# Patient Record
Sex: Male | Born: 1940 | Race: White | Hispanic: No | Marital: Single | Smoking: Never smoker
Health system: Southern US, Community
[De-identification: ages and names within clinical notes are randomized; demographics above are authoritative.]

## PROBLEM LIST (undated history)

## (undated) DIAGNOSIS — I4891 Unspecified atrial fibrillation: Secondary | ICD-10-CM

## (undated) HISTORY — PX: HERNIA REPAIR: SHX51

---

## 1999-10-12 ENCOUNTER — Inpatient Hospital Stay (HOSPITAL_COMMUNITY): Admission: EM | Admit: 1999-10-12 | Discharge: 1999-10-14 | Payer: Self-pay | Admitting: Emergency Medicine

## 1999-10-12 ENCOUNTER — Encounter: Payer: Self-pay | Admitting: Emergency Medicine

## 2004-05-03 ENCOUNTER — Ambulatory Visit: Payer: Self-pay | Admitting: Internal Medicine

## 2004-06-23 ENCOUNTER — Ambulatory Visit: Payer: Self-pay | Admitting: *Deleted

## 2004-07-19 ENCOUNTER — Ambulatory Visit: Payer: Self-pay

## 2004-08-16 ENCOUNTER — Ambulatory Visit: Payer: Self-pay | Admitting: Cardiology

## 2004-09-06 ENCOUNTER — Ambulatory Visit: Payer: Self-pay | Admitting: Cardiology

## 2004-10-27 ENCOUNTER — Ambulatory Visit: Payer: Self-pay | Admitting: Internal Medicine

## 2004-11-15 ENCOUNTER — Ambulatory Visit: Payer: Self-pay | Admitting: Internal Medicine

## 2004-12-10 ENCOUNTER — Ambulatory Visit: Payer: Self-pay | Admitting: Cardiology

## 2004-12-31 ENCOUNTER — Ambulatory Visit: Payer: Self-pay | Admitting: Cardiology

## 2005-01-28 ENCOUNTER — Ambulatory Visit: Payer: Self-pay | Admitting: Cardiology

## 2005-02-08 ENCOUNTER — Ambulatory Visit: Payer: Self-pay | Admitting: Cardiology

## 2005-02-28 ENCOUNTER — Ambulatory Visit: Payer: Self-pay | Admitting: Cardiology

## 2005-03-21 ENCOUNTER — Ambulatory Visit: Payer: Self-pay | Admitting: Cardiology

## 2005-04-18 ENCOUNTER — Ambulatory Visit: Payer: Self-pay | Admitting: Cardiology

## 2005-05-09 ENCOUNTER — Ambulatory Visit: Payer: Self-pay | Admitting: Cardiology

## 2005-06-03 ENCOUNTER — Ambulatory Visit: Payer: Self-pay | Admitting: Cardiology

## 2014-01-31 DIAGNOSIS — G4733 Obstructive sleep apnea (adult) (pediatric): Secondary | ICD-10-CM | POA: Insufficient documentation

## 2015-12-22 DIAGNOSIS — F339 Major depressive disorder, recurrent, unspecified: Secondary | ICD-10-CM | POA: Insufficient documentation

## 2018-01-15 ENCOUNTER — Inpatient Hospital Stay (HOSPITAL_COMMUNITY)
Admission: EM | Admit: 2018-01-15 | Discharge: 2018-01-19 | DRG: 028 | Disposition: A | Discharge status: Rehabilitation Facility | Payer: Medicare Other | Attending: Neurosurgery | Admitting: Neurosurgery

## 2018-01-15 ENCOUNTER — Emergency Department (HOSPITAL_COMMUNITY): Payer: Medicare Other

## 2018-01-15 ENCOUNTER — Other Ambulatory Visit: Payer: Self-pay

## 2018-01-15 ENCOUNTER — Encounter (HOSPITAL_COMMUNITY): Payer: Self-pay | Admitting: *Deleted

## 2018-01-15 ENCOUNTER — Inpatient Hospital Stay (HOSPITAL_COMMUNITY): Payer: Medicare Other

## 2018-01-15 DIAGNOSIS — R5383 Other fatigue: Secondary | ICD-10-CM | POA: Diagnosis present

## 2018-01-15 DIAGNOSIS — K592 Neurogenic bowel, not elsewhere classified: Secondary | ICD-10-CM | POA: Diagnosis not present

## 2018-01-15 DIAGNOSIS — R443 Hallucinations, unspecified: Secondary | ICD-10-CM | POA: Diagnosis not present

## 2018-01-15 DIAGNOSIS — I10 Essential (primary) hypertension: Secondary | ICD-10-CM | POA: Diagnosis present

## 2018-01-15 DIAGNOSIS — Z419 Encounter for procedure for purposes other than remedying health state, unspecified: Secondary | ICD-10-CM

## 2018-01-15 DIAGNOSIS — G8250 Quadriplegia, unspecified: Secondary | ICD-10-CM | POA: Diagnosis present

## 2018-01-15 DIAGNOSIS — S069X3S Unspecified intracranial injury with loss of consciousness of 1 hour to 5 hours 59 minutes, sequela: Secondary | ICD-10-CM | POA: Diagnosis not present

## 2018-01-15 DIAGNOSIS — N319 Neuromuscular dysfunction of bladder, unspecified: Secondary | ICD-10-CM | POA: Diagnosis not present

## 2018-01-15 DIAGNOSIS — S12500A Unspecified displaced fracture of sixth cervical vertebra, initial encounter for closed fracture: Secondary | ICD-10-CM | POA: Diagnosis present

## 2018-01-15 DIAGNOSIS — S129XXS Fracture of neck, unspecified, sequela: Secondary | ICD-10-CM | POA: Diagnosis not present

## 2018-01-15 DIAGNOSIS — Y92410 Unspecified street and highway as the place of occurrence of the external cause: Secondary | ICD-10-CM

## 2018-01-15 DIAGNOSIS — M4802 Spinal stenosis, cervical region: Secondary | ICD-10-CM | POA: Diagnosis present

## 2018-01-15 DIAGNOSIS — I7774 Dissection of vertebral artery: Secondary | ICD-10-CM | POA: Diagnosis present

## 2018-01-15 DIAGNOSIS — Z23 Encounter for immunization: Secondary | ICD-10-CM | POA: Diagnosis present

## 2018-01-15 DIAGNOSIS — D62 Acute posthemorrhagic anemia: Secondary | ICD-10-CM | POA: Diagnosis not present

## 2018-01-15 DIAGNOSIS — S14125A Central cord syndrome at C5 level of cervical spinal cord, initial encounter: Principal | ICD-10-CM | POA: Diagnosis present

## 2018-01-15 DIAGNOSIS — Z981 Arthrodesis status: Secondary | ICD-10-CM | POA: Diagnosis not present

## 2018-01-15 DIAGNOSIS — G4710 Hypersomnia, unspecified: Secondary | ICD-10-CM | POA: Diagnosis not present

## 2018-01-15 DIAGNOSIS — S129XXA Fracture of neck, unspecified, initial encounter: Secondary | ICD-10-CM | POA: Diagnosis present

## 2018-01-15 DIAGNOSIS — R4781 Slurred speech: Secondary | ICD-10-CM | POA: Diagnosis present

## 2018-01-15 DIAGNOSIS — R0989 Other specified symptoms and signs involving the circulatory and respiratory systems: Secondary | ICD-10-CM | POA: Diagnosis not present

## 2018-01-15 DIAGNOSIS — S12400A Unspecified displaced fracture of fifth cervical vertebra, initial encounter for closed fracture: Secondary | ICD-10-CM | POA: Diagnosis present

## 2018-01-15 DIAGNOSIS — G8918 Other acute postprocedural pain: Secondary | ICD-10-CM | POA: Diagnosis not present

## 2018-01-15 DIAGNOSIS — I4891 Unspecified atrial fibrillation: Secondary | ICD-10-CM | POA: Diagnosis present

## 2018-01-15 DIAGNOSIS — Y901 Blood alcohol level of 20-39 mg/100 ml: Secondary | ICD-10-CM | POA: Diagnosis present

## 2018-01-15 DIAGNOSIS — Z888 Allergy status to other drugs, medicaments and biological substances status: Secondary | ICD-10-CM | POA: Diagnosis not present

## 2018-01-15 DIAGNOSIS — R400 Somnolence: Secondary | ICD-10-CM | POA: Diagnosis present

## 2018-01-15 DIAGNOSIS — F1010 Alcohol abuse, uncomplicated: Secondary | ICD-10-CM | POA: Diagnosis not present

## 2018-01-15 DIAGNOSIS — S14109A Unspecified injury at unspecified level of cervical spinal cord, initial encounter: Secondary | ICD-10-CM | POA: Diagnosis present

## 2018-01-15 DIAGNOSIS — G4733 Obstructive sleep apnea (adult) (pediatric): Secondary | ICD-10-CM | POA: Diagnosis present

## 2018-01-15 DIAGNOSIS — A499 Bacterial infection, unspecified: Secondary | ICD-10-CM | POA: Diagnosis not present

## 2018-01-15 DIAGNOSIS — S14106D Unspecified injury at C6 level of cervical spinal cord, subsequent encounter: Secondary | ICD-10-CM | POA: Diagnosis present

## 2018-01-15 DIAGNOSIS — Z7901 Long term (current) use of anticoagulants: Secondary | ICD-10-CM | POA: Diagnosis not present

## 2018-01-15 DIAGNOSIS — N400 Enlarged prostate without lower urinary tract symptoms: Secondary | ICD-10-CM | POA: Diagnosis present

## 2018-01-15 DIAGNOSIS — S14129A Central cord syndrome at unspecified level of cervical spinal cord, initial encounter: Secondary | ICD-10-CM

## 2018-01-15 DIAGNOSIS — Z79899 Other long term (current) drug therapy: Secondary | ICD-10-CM | POA: Diagnosis not present

## 2018-01-15 DIAGNOSIS — G9519 Other vascular myelopathies: Secondary | ICD-10-CM | POA: Diagnosis present

## 2018-01-15 DIAGNOSIS — N390 Urinary tract infection, site not specified: Secondary | ICD-10-CM | POA: Diagnosis not present

## 2018-01-15 DIAGNOSIS — Z882 Allergy status to sulfonamides status: Secondary | ICD-10-CM | POA: Diagnosis not present

## 2018-01-15 DIAGNOSIS — R4182 Altered mental status, unspecified: Secondary | ICD-10-CM | POA: Diagnosis not present

## 2018-01-15 DIAGNOSIS — E876 Hypokalemia: Secondary | ICD-10-CM | POA: Diagnosis not present

## 2018-01-15 DIAGNOSIS — R1312 Dysphagia, oropharyngeal phase: Secondary | ICD-10-CM | POA: Diagnosis not present

## 2018-01-15 DIAGNOSIS — S12400D Unspecified displaced fracture of fifth cervical vertebra, subsequent encounter for fracture with routine healing: Secondary | ICD-10-CM | POA: Diagnosis not present

## 2018-01-15 DIAGNOSIS — G4730 Sleep apnea, unspecified: Secondary | ICD-10-CM | POA: Diagnosis not present

## 2018-01-15 DIAGNOSIS — S14109S Unspecified injury at unspecified level of cervical spinal cord, sequela: Secondary | ICD-10-CM | POA: Diagnosis not present

## 2018-01-15 DIAGNOSIS — I480 Paroxysmal atrial fibrillation: Secondary | ICD-10-CM | POA: Diagnosis not present

## 2018-01-15 DIAGNOSIS — I951 Orthostatic hypotension: Secondary | ICD-10-CM | POA: Diagnosis not present

## 2018-01-15 DIAGNOSIS — I482 Chronic atrial fibrillation: Secondary | ICD-10-CM | POA: Diagnosis not present

## 2018-01-15 DIAGNOSIS — S069X0D Unspecified intracranial injury without loss of consciousness, subsequent encounter: Secondary | ICD-10-CM | POA: Diagnosis not present

## 2018-01-15 DIAGNOSIS — S12500D Unspecified displaced fracture of sixth cervical vertebra, subsequent encounter for fracture with routine healing: Secondary | ICD-10-CM | POA: Diagnosis not present

## 2018-01-15 DIAGNOSIS — S14106S Unspecified injury at C6 level of cervical spinal cord, sequela: Secondary | ICD-10-CM | POA: Diagnosis not present

## 2018-01-15 DIAGNOSIS — R442 Other hallucinations: Secondary | ICD-10-CM | POA: Diagnosis not present

## 2018-01-15 DIAGNOSIS — R1310 Dysphagia, unspecified: Secondary | ICD-10-CM | POA: Diagnosis not present

## 2018-01-15 HISTORY — DX: Unspecified atrial fibrillation: I48.91

## 2018-01-15 LAB — CBC
HEMATOCRIT: 41 % (ref 39.0–52.0)
HEMOGLOBIN: 13.4 g/dL (ref 13.0–17.0)
MCH: 32.2 pg (ref 26.0–34.0)
MCHC: 32.7 g/dL (ref 30.0–36.0)
MCV: 98.6 fL (ref 78.0–100.0)
Platelets: 183 10*3/uL (ref 150–400)
RBC: 4.16 MIL/uL — AB (ref 4.22–5.81)
RDW: 13.3 % (ref 11.5–15.5)
WBC: 5 10*3/uL (ref 4.0–10.5)

## 2018-01-15 LAB — BPAM FFP
BLOOD PRODUCT EXPIRATION DATE: 201908202359
BLOOD PRODUCT EXPIRATION DATE: 201908312359
ISSUE DATE / TIME: 201908121416
ISSUE DATE / TIME: 201908121416
UNIT TYPE AND RH: 6200
Unit Type and Rh: 6200

## 2018-01-15 LAB — URINALYSIS, ROUTINE W REFLEX MICROSCOPIC
BILIRUBIN URINE: NEGATIVE
GLUCOSE, UA: NEGATIVE mg/dL
Hgb urine dipstick: NEGATIVE
Ketones, ur: 5 mg/dL — AB
Nitrite: NEGATIVE
Protein, ur: NEGATIVE mg/dL
Specific Gravity, Urine: 1.024 (ref 1.005–1.030)
pH: 6 (ref 5.0–8.0)

## 2018-01-15 LAB — ETHANOL: Alcohol, Ethyl (B): 34 mg/dL — ABNORMAL HIGH (ref <10)

## 2018-01-15 LAB — I-STAT CHEM 8, ED
BUN: 15 mg/dL (ref 8–23)
CREATININE: 0.7 mg/dL (ref 0.61–1.24)
Calcium, Ion: 1 mmol/L — ABNORMAL LOW (ref 1.15–1.40)
Chloride: 104 mmol/L (ref 98–111)
Glucose, Bld: 94 mg/dL (ref 70–99)
HEMATOCRIT: 39 % (ref 39.0–52.0)
HEMOGLOBIN: 13.3 g/dL (ref 13.0–17.0)
Potassium: 4.1 mmol/L (ref 3.5–5.1)
Sodium: 138 mmol/L (ref 135–145)
TCO2: 20 mmol/L — AB (ref 22–32)

## 2018-01-15 LAB — PREPARE FRESH FROZEN PLASMA
UNIT DIVISION: 0
Unit division: 0

## 2018-01-15 LAB — COMPREHENSIVE METABOLIC PANEL
ALT: 16 U/L (ref 0–44)
AST: 26 U/L (ref 15–41)
Albumin: 3.1 g/dL — ABNORMAL LOW (ref 3.5–5.0)
Alkaline Phosphatase: 43 U/L (ref 38–126)
Anion gap: 8 (ref 5–15)
BUN: 14 mg/dL (ref 8–23)
CO2: 23 mmol/L (ref 22–32)
Calcium: 7.9 mg/dL — ABNORMAL LOW (ref 8.9–10.3)
Chloride: 107 mmol/L (ref 98–111)
Creatinine, Ser: 0.81 mg/dL (ref 0.61–1.24)
GFR calc non Af Amer: >60 mL/min (ref >60)
Glucose, Bld: 100 mg/dL — ABNORMAL HIGH (ref 70–99)
POTASSIUM: 4.2 mmol/L (ref 3.5–5.1)
Sodium: 138 mmol/L (ref 135–145)
Total Bilirubin: 0.9 mg/dL (ref 0.3–1.2)
Total Protein: 5.2 g/dL — ABNORMAL LOW (ref 6.5–8.1)

## 2018-01-15 LAB — PROTIME-INR
INR: 2.66
INR: 1.38
Prothrombin Time: 28.1 seconds — ABNORMAL HIGH (ref 11.4–15.2)
Prothrombin Time: 16.8 seconds — ABNORMAL HIGH (ref 11.4–15.2)

## 2018-01-15 LAB — ABO/RH: ABO/RH(D): A POS

## 2018-01-15 LAB — CDS SEROLOGY

## 2018-01-15 LAB — I-STAT CG4 LACTIC ACID, ED: Lactic Acid, Venous: 1.99 mmol/L — ABNORMAL HIGH (ref 0.5–1.9)

## 2018-01-15 LAB — APTT: aPTT: 32 seconds (ref 24–36)

## 2018-01-15 MED ORDER — ONDANSETRON HCL 4 MG/2ML IJ SOLN
4.0000 mg | Freq: Once | INTRAMUSCULAR | Status: AC
  Administered 2018-01-15: 4 mg via INTRAVENOUS
  Filled 2018-01-15: qty 2

## 2018-01-15 MED ORDER — METOPROLOL TARTRATE 5 MG/5ML IV SOLN
5.0000 mg | Freq: Four times a day (QID) | INTRAVENOUS | Status: DC | PRN
  Administered 2018-01-18: 5 mg via INTRAVENOUS
  Filled 2018-01-15: qty 5

## 2018-01-15 MED ORDER — PROTHROMBIN COMPLEX CONC HUMAN 500 UNITS IV KIT
1636.0000 [IU] | PACK | Status: AC
  Administered 2018-01-15: 1636 [IU] via INTRAVENOUS
  Filled 2018-01-15: qty 1000

## 2018-01-15 MED ORDER — VITAMIN K1 10 MG/ML IJ SOLN
10.0000 mg | Freq: Once | INTRAVENOUS | Status: AC
  Administered 2018-01-15: 10 mg via INTRAVENOUS
  Filled 2018-01-15: qty 1

## 2018-01-15 MED ORDER — ONDANSETRON HCL 4 MG/2ML IJ SOLN: 4.0000 mg | Freq: Four times a day (QID) | INTRAMUSCULAR | Status: DC | PRN

## 2018-01-15 MED ORDER — SODIUM CHLORIDE 0.9 % IV SOLN
INTRAVENOUS | Status: DC
  Administered 2018-01-15 – 2018-01-17 (×5): via INTRAVENOUS

## 2018-01-15 MED ORDER — FENTANYL CITRATE (PF) 100 MCG/2ML IJ SOLN
INTRAMUSCULAR | Status: AC | PRN
  Administered 2018-01-15 (×2): 25 ug via INTRAVENOUS

## 2018-01-15 MED ORDER — FAMOTIDINE IN NACL 20-0.9 MG/50ML-% IV SOLN
20.0000 mg | Freq: Two times a day (BID) | INTRAVENOUS | Status: DC
  Administered 2018-01-15 – 2018-01-18 (×5): 20 mg via INTRAVENOUS
  Filled 2018-01-15 (×5): qty 50

## 2018-01-15 MED ORDER — PROMETHAZINE HCL 25 MG/ML IJ SOLN
12.5000 mg | Freq: Once | INTRAMUSCULAR | Status: AC
  Administered 2018-01-15: 12.5 mg via INTRAVENOUS
  Filled 2018-01-15: qty 1

## 2018-01-15 MED ORDER — FENTANYL CITRATE (PF) 100 MCG/2ML IJ SOLN
INTRAMUSCULAR | Status: AC
  Filled 2018-01-15: qty 2

## 2018-01-15 MED ORDER — ONDANSETRON 4 MG PO TBDP: 4.0000 mg | ORAL_TABLET | Freq: Four times a day (QID) | ORAL | Status: DC | PRN

## 2018-01-15 MED ORDER — ACETAMINOPHEN 10 MG/ML IV SOLN
1000.0000 mg | Freq: Four times a day (QID) | INTRAVENOUS | Status: DC
  Administered 2018-01-15 – 2018-01-16 (×3): 1000 mg via INTRAVENOUS
  Filled 2018-01-15 (×4): qty 100

## 2018-01-15 MED ORDER — IOHEXOL 300 MG/ML  SOLN
100.0000 mL | Freq: Once | INTRAMUSCULAR | Status: AC | PRN
  Administered 2018-01-15: 100 mL via INTRAVENOUS

## 2018-01-15 MED ORDER — FENTANYL CITRATE (PF) 100 MCG/2ML IJ SOLN
25.0000 ug | INTRAMUSCULAR | Status: DC | PRN
  Administered 2018-01-15 (×2): 25 ug via INTRAVENOUS
  Administered 2018-01-15 – 2018-01-17 (×8): 50 ug via INTRAVENOUS
  Filled 2018-01-15 (×9): qty 2

## 2018-01-15 NOTE — Progress Notes (Signed)
I have [facility] the [name] 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  There is evidence of spinal cord signal change at C5-6.There is evidence of ligamentous injury at C5-6.  I do not see significant acute compressive pathology requiring emergent decompression.  He will likely need a 3 level anterior cervical diskectomy fusion plating at some point in the future.

## 2018-01-15 NOTE — Progress Notes (Signed)
Orthopedic Tech Progress Note Patient Details:  Nelia ShiLuray Kkk Doe 06/06/1874 [id number]  Patient ID: Nelia ShiLuray Kkk Doe, male   DOB: 06/06/1874, [age] y.o.   MRN: [id number]   Nikki DomCrawford, Shatisha Falter 01/15/2018, 2:24 PM Made level 1 trauma visit
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
## 2018-01-15 NOTE — ED Notes (Signed)
Dr. Lovell SheehanJenkins in to assess pt at this time.  Pt continues to c/o nausea, order for Phenergan recieved

## 2018-01-15 NOTE — ED Notes (Signed)
Attempted to reach daughter Clydie BraunKaren [phone] , no answer message left.

## 2018-01-15 NOTE — ED Notes (Signed)
Blood collected

## 2018-01-15 NOTE — ED Notes (Signed)
Aspen collar applied with Dr, Charm BargesButler and Dr. Lindie SpruceWyatt at bedside

## 2018-01-15 NOTE — ED Notes (Signed)
To CT

## 2018-01-15 NOTE — ED Notes (Signed)
[name] and girlfriend at bedside at this time

## 2018-01-15 NOTE — Progress Notes (Signed)
   01/15/18 1648  Clinical Encounter Type  Visited With Health care provider;Family;Patient  Visit Type ED;Follow-up  Stress Factors  Family Stress Factors  (very worried)   Followed up at bedside providing support and comfort.  Was able to get his fiance's number and he asked me to call her.  Patient shared about his life and his wedding planned for September.  Shared about his family.  His daughter Clydie BraunKaren and Merwyn KatosFiance Jean did arrive.  Dr. Lindie SpruceWyatt spoke with them in the consult room.  I provided hospitality for the family.  Patient being admitted.  Will follow and support as needed. Chaplain Agustin CreeNewton Makeisha Jentsch

## 2018-01-15 NOTE — ED Notes (Signed)
Blood tubed
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
## 2018-01-15 NOTE — ED Notes (Signed)
Pt c/o pain in neck, shoulders and bil arms   Pain med given for same

## 2018-01-16 ENCOUNTER — Other Ambulatory Visit: Payer: Self-pay | Admitting: Neurosurgery

## 2018-01-16 LAB — COMPREHENSIVE METABOLIC PANEL
ALBUMIN: 2.9 g/dL — AB (ref 3.5–5.0)
ALT: 18 U/L (ref 0–44)
ANION GAP: 7 (ref 5–15)
AST: 27 U/L (ref 15–41)
Alkaline Phosphatase: 43 U/L (ref 38–126)
BUN: 15 mg/dL (ref 8–23)
CHLORIDE: 106 mmol/L (ref 98–111)
CO2: 24 mmol/L (ref 22–32)
Calcium: 7.8 mg/dL — ABNORMAL LOW (ref 8.9–10.3)
Creatinine, Ser: 0.79 mg/dL (ref 0.61–1.24)
GFR calc Af Amer: >60 mL/min (ref >60)
GFR calc non Af Amer: >60 mL/min (ref >60)
GLUCOSE: 103 mg/dL — AB (ref 70–99)
POTASSIUM: 4.2 mmol/L (ref 3.5–5.1)
SODIUM: 137 mmol/L (ref 135–145)
Total Bilirubin: 1.5 mg/dL — ABNORMAL HIGH (ref 0.3–1.2)
Total Protein: 5 g/dL — ABNORMAL LOW (ref 6.5–8.1)

## 2018-01-16 LAB — PROTIME-INR
INR: 1.25
INR: 1.28
Prothrombin Time: 15.6 s — ABNORMAL HIGH (ref 11.4–15.2)
Prothrombin Time: 15.9 s — ABNORMAL HIGH (ref 11.4–15.2)

## 2018-01-16 LAB — CBC
HCT: 38.7 % — ABNORMAL LOW (ref 39.0–52.0)
Hemoglobin: 13 g/dL (ref 13.0–17.0)
MCH: 32.4 pg (ref 26.0–34.0)
MCHC: 33.6 g/dL (ref 30.0–36.0)
MCV: 96.5 fL (ref 78.0–100.0)
Platelets: 153 K/uL (ref 150–400)
RBC: 4.01 MIL/uL — ABNORMAL LOW (ref 4.22–5.81)
RDW: 13.4 % (ref 11.5–15.5)
WBC: 7.7 K/uL (ref 4.0–10.5)

## 2018-01-16 LAB — MRSA PCR SCREENING: MRSA by PCR: NEGATIVE

## 2018-01-16 LAB — TYPE AND SCREEN
ABO/RH(D): A POS
Antibody Screen: NEGATIVE
UNIT DIVISION: 0
Unit division: 0

## 2018-01-16 LAB — BPAM RBC
Blood Product Expiration Date: 201909072359
Blood Product Expiration Date: 201909072359
ISSUE DATE / TIME: 201908121415
ISSUE DATE / TIME: 201908121415
UNIT TYPE AND RH: 9500
UNIT TYPE AND RH: 9500

## 2018-01-16 LAB — BLOOD PRODUCT ORDER (VERBAL) VERIFICATION

## 2018-01-16 LAB — LACTIC ACID, PLASMA: Lactic Acid, Venous: 1.1 mmol/L (ref 0.5–1.9)

## 2018-01-16 MED ORDER — LACTATED RINGERS IV BOLUS
500.0000 mL | Freq: Once | INTRAVENOUS | Status: AC
  Administered 2018-01-16: 500 mL via INTRAVENOUS

## 2018-01-16 MED ORDER — TRAMADOL HCL 50 MG PO TABS
50.0000 mg | ORAL_TABLET | Freq: Four times a day (QID) | ORAL | Status: DC | PRN
  Administered 2018-01-16 – 2018-01-17 (×3): 50 mg via ORAL
  Filled 2018-01-16 (×3): qty 1

## 2018-01-16 MED ORDER — METOPROLOL TARTRATE 25 MG PO TABS
25.0000 mg | ORAL_TABLET | Freq: Two times a day (BID) | ORAL | Status: DC
  Administered 2018-01-16 – 2018-01-18 (×4): 25 mg via ORAL
  Filled 2018-01-16 (×4): qty 1

## 2018-01-16 MED ORDER — ACETAMINOPHEN 325 MG PO TABS: 650.0000 mg | ORAL_TABLET | Freq: Four times a day (QID) | ORAL | Status: DC | PRN
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
## 2018-01-16 NOTE — Progress Notes (Signed)
   01/16/18 1200  Clinical Encounter Type  Visited With Patient and family together  Visit Type Follow-up  Stress Factors  Family Stress Factors Health changes   Followed up from an encounter in the ED yesterday.  Patient's daughter said surgery is scheduled for tomorrow and that he is doing better.  Patient was welcoming of the visit and hopes the surgery can relieve the discomfort.  Will follow and support as needed. Chaplain Agustin CreeNewton Kymani Laursen

## 2018-01-16 NOTE — Progress Notes (Signed)
Pt has only produced 300 cc of amber urine in 12 hours. He is coughing occasionally with pos. Discussed with Dr Derrell Lollingamirez. Orders received.

## 2018-01-17 ENCOUNTER — Inpatient Hospital Stay (HOSPITAL_COMMUNITY): Admission: EM | Disposition: A | Discharge status: Rehabilitation Facility | Payer: Self-pay | Source: Home / Self Care

## 2018-01-17 ENCOUNTER — Inpatient Hospital Stay (HOSPITAL_COMMUNITY): Payer: Medicare Other | Admitting: Anesthesiology

## 2018-01-17 ENCOUNTER — Encounter (HOSPITAL_COMMUNITY): Payer: Self-pay | Admitting: Orthopedic Surgery

## 2018-01-17 ENCOUNTER — Inpatient Hospital Stay (HOSPITAL_COMMUNITY): Payer: Medicare Other

## 2018-01-17 DIAGNOSIS — S14109A Unspecified injury at unspecified level of cervical spinal cord, initial encounter: Secondary | ICD-10-CM

## 2018-01-17 DIAGNOSIS — S129XXA Fracture of neck, unspecified, initial encounter: Secondary | ICD-10-CM | POA: Diagnosis present

## 2018-01-17 HISTORY — PX: POSTERIOR CERVICAL FUSION/FORAMINOTOMY: SHX5038

## 2018-01-17 LAB — CBC
HCT: 39.7 % (ref 39.0–52.0)
Hemoglobin: 13 g/dL (ref 13.0–17.0)
MCH: 31.9 pg (ref 26.0–34.0)
MCHC: 32.7 g/dL (ref 30.0–36.0)
MCV: 97.3 fL (ref 78.0–100.0)
Platelets: 146 10*3/uL — ABNORMAL LOW (ref 150–400)
RBC: 4.08 MIL/uL — ABNORMAL LOW (ref 4.22–5.81)
RDW: 13.4 % (ref 11.5–15.5)
WBC: 7.7 10*3/uL (ref 4.0–10.5)

## 2018-01-17 LAB — BASIC METABOLIC PANEL
Anion gap: 4 — ABNORMAL LOW (ref 5–15)
BUN: 14 mg/dL (ref 8–23)
CALCIUM: 8 mg/dL — AB (ref 8.9–10.3)
CO2: 25 mmol/L (ref 22–32)
Chloride: 105 mmol/L (ref 98–111)
Creatinine, Ser: 0.71 mg/dL (ref 0.61–1.24)
GFR calc Af Amer: >60 mL/min (ref >60)
GFR calc non Af Amer: >60 mL/min (ref >60)
Glucose, Bld: 131 mg/dL — ABNORMAL HIGH (ref 70–99)
Potassium: 4.1 mmol/L (ref 3.5–5.1)
Sodium: 134 mmol/L — ABNORMAL LOW (ref 135–145)

## 2018-01-17 LAB — PROTIME-INR
INR: 1.18
Prothrombin Time: 14.9 seconds (ref 11.4–15.2)

## 2018-01-17 SURGERY — POSTERIOR CERVICAL FUSION/FORAMINOTOMY LEVEL 4
Anesthesia: General | Site: Spine Cervical

## 2018-01-17 MED ORDER — BACITRACIN ZINC 500 UNIT/GM EX OINT
TOPICAL_OINTMENT | CUTANEOUS | Status: AC
  Filled 2018-01-17: qty 28.35

## 2018-01-17 MED ORDER — ONDANSETRON HCL 4 MG PO TABS: 4.0000 mg | ORAL_TABLET | Freq: Four times a day (QID) | ORAL | Status: DC | PRN

## 2018-01-17 MED ORDER — BISACODYL 10 MG RE SUPP
10.0000 mg | Freq: Every day | RECTAL | Status: DC | PRN
  Administered 2018-01-19: 10 mg via RECTAL
  Filled 2018-01-17: qty 1

## 2018-01-17 MED ORDER — LACTATED RINGERS IV SOLN
INTRAVENOUS | Status: DC
  Administered 2018-01-17: 17:00:00 via INTRAVENOUS

## 2018-01-17 MED ORDER — ALUM & MAG HYDROXIDE-SIMETH 200-200-20 MG/5ML PO SUSP: 30.0000 mL | Freq: Four times a day (QID) | ORAL | Status: DC | PRN

## 2018-01-17 MED ORDER — ACETAMINOPHEN 325 MG PO TABS: 650.0000 mg | ORAL_TABLET | ORAL | Status: DC | PRN

## 2018-01-17 MED ORDER — ROCURONIUM BROMIDE 50 MG/5ML IV SOSY
PREFILLED_SYRINGE | INTRAVENOUS | Status: DC | PRN
  Administered 2018-01-17 (×2): 50 mg via INTRAVENOUS

## 2018-01-17 MED ORDER — ACETAMINOPHEN 500 MG PO TABS
1000.0000 mg | ORAL_TABLET | Freq: Four times a day (QID) | ORAL | Status: AC
  Administered 2018-01-18 (×4): 1000 mg via ORAL
  Filled 2018-01-17 (×4): qty 2

## 2018-01-17 MED ORDER — DEXAMETHASONE SODIUM PHOSPHATE 10 MG/ML IJ SOLN
INTRAMUSCULAR | Status: DC | PRN
  Administered 2018-01-17: 10 mg via INTRAVENOUS

## 2018-01-17 MED ORDER — BUPIVACAINE-EPINEPHRINE (PF) 0.5% -1:200000 IJ SOLN
INTRAMUSCULAR | Status: AC
  Filled 2018-01-17: qty 30

## 2018-01-17 MED ORDER — THROMBIN (RECOMBINANT) 20000 UNITS EX SOLR
CUTANEOUS | Status: AC
  Filled 2018-01-17: qty 20000

## 2018-01-17 MED ORDER — SODIUM CHLORIDE 0.9 % IV SOLN
INTRAVENOUS | Status: DC | PRN
  Administered 2018-01-17: 19:00:00

## 2018-01-17 MED ORDER — THROMBIN 20000 UNITS EX SOLR
CUTANEOUS | Status: DC | PRN
  Administered 2018-01-17: 19:00:00 via TOPICAL

## 2018-01-17 MED ORDER — LIDOCAINE 2% (20 MG/ML) 5 ML SYRINGE
INTRAMUSCULAR | Status: AC
  Filled 2018-01-17: qty 5

## 2018-01-17 MED ORDER — BACITRACIN ZINC 500 UNIT/GM EX OINT
TOPICAL_OINTMENT | CUTANEOUS | Status: DC | PRN
  Administered 2018-01-17 (×2): 1 via TOPICAL

## 2018-01-17 MED ORDER — LISINOPRIL 20 MG PO TABS
20.0000 mg | ORAL_TABLET | Freq: Every day | ORAL | Status: DC
  Administered 2018-01-18: 20 mg via ORAL
  Filled 2018-01-17: qty 1

## 2018-01-17 MED ORDER — MENTHOL 3 MG MT LOZG: 1.0000 | LOZENGE | OROMUCOSAL | Status: DC | PRN

## 2018-01-17 MED ORDER — ONDANSETRON HCL 4 MG/2ML IJ SOLN
INTRAMUSCULAR | Status: AC
  Filled 2018-01-17: qty 2

## 2018-01-17 MED ORDER — ALBUMIN HUMAN 5 % IV SOLN
INTRAVENOUS | Status: DC | PRN
  Administered 2018-01-17: 19:00:00 via INTRAVENOUS

## 2018-01-17 MED ORDER — CEFAZOLIN SODIUM-DEXTROSE 2-3 GM-%(50ML) IV SOLR
INTRAVENOUS | Status: DC | PRN
  Administered 2018-01-17: 2 g via INTRAVENOUS

## 2018-01-17 MED ORDER — FENTANYL CITRATE (PF) 250 MCG/5ML IJ SOLN
INTRAMUSCULAR | Status: AC
  Filled 2018-01-17: qty 5

## 2018-01-17 MED ORDER — MORPHINE SULFATE (PF) 4 MG/ML IV SOLN
4.0000 mg | INTRAVENOUS | Status: DC | PRN
Start: 2018-01-17 — End: 2018-01-19
  Administered 2018-01-18: 4 mg via INTRAVENOUS
  Filled 2018-01-17: qty 1

## 2018-01-17 MED ORDER — HYDROMORPHONE HCL 1 MG/ML IJ SOLN: 0.2500 mg | INTRAMUSCULAR | Status: DC | PRN

## 2018-01-17 MED ORDER — PREGABALIN 50 MG PO CAPS
100.0000 mg | ORAL_CAPSULE | Freq: Two times a day (BID) | ORAL | Status: DC
Start: 2018-01-17 — End: 2018-01-19
  Administered 2018-01-17 – 2018-01-19 (×4): 100 mg via ORAL
  Filled 2018-01-17 (×4): qty 2

## 2018-01-17 MED ORDER — DEXAMETHASONE SODIUM PHOSPHATE 10 MG/ML IJ SOLN
INTRAMUSCULAR | Status: AC
  Filled 2018-01-17: qty 1

## 2018-01-17 MED ORDER — PANTOPRAZOLE SODIUM 20 MG PO TBEC
20.0000 mg | DELAYED_RELEASE_TABLET | Freq: Two times a day (BID) | ORAL | Status: DC
  Administered 2018-01-18 – 2018-01-19 (×4): 20 mg via ORAL
  Filled 2018-01-17 (×4): qty 1

## 2018-01-17 MED ORDER — METOPROLOL TARTRATE 50 MG PO TABS: 75.0000 mg | ORAL_TABLET | Freq: Every day | ORAL | Status: DC

## 2018-01-17 MED ORDER — BUPIVACAINE LIPOSOME 1.3 % IJ SUSP
20.0000 mL | INTRAMUSCULAR | Status: DC
  Filled 2018-01-17: qty 20

## 2018-01-17 MED ORDER — CYCLOBENZAPRINE HCL 10 MG PO TABS
10.0000 mg | ORAL_TABLET | Freq: Three times a day (TID) | ORAL | Status: DC | PRN
  Administered 2018-01-18: 10 mg via ORAL
  Filled 2018-01-17: qty 1

## 2018-01-17 MED ORDER — PROPOFOL 10 MG/ML IV BOLUS
INTRAVENOUS | Status: AC
  Filled 2018-01-17: qty 20

## 2018-01-17 MED ORDER — BUPIVACAINE-EPINEPHRINE 0.5% -1:200000 IJ SOLN
INTRAMUSCULAR | Status: DC | PRN
  Administered 2018-01-17: 10 mL

## 2018-01-17 MED ORDER — PROPOFOL 10 MG/ML IV BOLUS
INTRAVENOUS | Status: DC | PRN
  Administered 2018-01-17: 110 mg via INTRAVENOUS

## 2018-01-17 MED ORDER — LIDOCAINE 2% (20 MG/ML) 5 ML SYRINGE
INTRAMUSCULAR | Status: DC | PRN
  Administered 2018-01-17: 80 mg via INTRAVENOUS

## 2018-01-17 MED ORDER — ONDANSETRON HCL 4 MG/2ML IJ SOLN
4.0000 mg | Freq: Four times a day (QID) | INTRAMUSCULAR | Status: DC | PRN
  Administered 2018-01-18: 4 mg via INTRAVENOUS
  Filled 2018-01-17: qty 2

## 2018-01-17 MED ORDER — OXYCODONE HCL 5 MG PO TABS
5.0000 mg | ORAL_TABLET | ORAL | Status: DC | PRN
  Administered 2018-01-18 (×2): 5 mg via ORAL
  Filled 2018-01-17 (×2): qty 1

## 2018-01-17 MED ORDER — TAMSULOSIN HCL 0.4 MG PO CAPS
0.4000 mg | ORAL_CAPSULE | Freq: Every day | ORAL | Status: DC
  Administered 2018-01-18: 0.4 mg via ORAL
  Filled 2018-01-17: qty 1

## 2018-01-17 MED ORDER — DOCUSATE SODIUM 100 MG PO CAPS
100.0000 mg | ORAL_CAPSULE | Freq: Two times a day (BID) | ORAL | Status: DC
  Administered 2018-01-18 – 2018-01-19 (×4): 100 mg via ORAL
  Filled 2018-01-17 (×3): qty 1

## 2018-01-17 MED ORDER — LACTATED RINGERS IV SOLN
INTRAVENOUS | Status: DC
  Administered 2018-01-17 – 2018-01-19 (×4): via INTRAVENOUS

## 2018-01-17 MED ORDER — THROMBIN 5000 UNITS EX SOLR
OROMUCOSAL | Status: DC | PRN
  Administered 2018-01-17: 19:00:00 via TOPICAL

## 2018-01-17 MED ORDER — OXYCODONE HCL 5 MG PO TABS
10.0000 mg | ORAL_TABLET | ORAL | Status: DC | PRN
  Administered 2018-01-19: 10 mg via ORAL
  Filled 2018-01-17: qty 2

## 2018-01-17 MED ORDER — PHENOL 1.4 % MT LIQD: 1.0000 | OROMUCOSAL | Status: DC | PRN

## 2018-01-17 MED ORDER — FENTANYL CITRATE (PF) 100 MCG/2ML IJ SOLN
INTRAMUSCULAR | Status: DC | PRN
  Administered 2018-01-17 (×2): 50 ug via INTRAVENOUS
  Administered 2018-01-17 (×2): 100 ug via INTRAVENOUS

## 2018-01-17 MED ORDER — ESCITALOPRAM OXALATE 10 MG PO TABS
10.0000 mg | ORAL_TABLET | Freq: Every day | ORAL | Status: DC
Start: 2018-01-18 — End: 2018-01-19
  Administered 2018-01-18 – 2018-01-19 (×2): 10 mg via ORAL
  Filled 2018-01-17 (×2): qty 1

## 2018-01-17 MED ORDER — 0.9 % SODIUM CHLORIDE (POUR BTL) OPTIME
TOPICAL | Status: DC | PRN
  Administered 2018-01-17: 1000 mL

## 2018-01-17 MED ORDER — ROCURONIUM BROMIDE 10 MG/ML (PF) SYRINGE
PREFILLED_SYRINGE | INTRAVENOUS | Status: AC
  Filled 2018-01-17: qty 10

## 2018-01-17 MED ORDER — CEFAZOLIN SODIUM-DEXTROSE 2-4 GM/100ML-% IV SOLN
2.0000 g | Freq: Three times a day (TID) | INTRAVENOUS | Status: AC
  Administered 2018-01-18 (×2): 2 g via INTRAVENOUS
  Filled 2018-01-17 (×2): qty 100

## 2018-01-17 MED ORDER — ACETAMINOPHEN 650 MG RE SUPP: 650.0000 mg | RECTAL | Status: DC | PRN

## 2018-01-17 MED ORDER — THROMBIN 5000 UNITS EX SOLR
CUTANEOUS | Status: AC
  Filled 2018-01-17: qty 5000

## 2018-01-17 MED ORDER — SUGAMMADEX SODIUM 200 MG/2ML IV SOLN
INTRAVENOUS | Status: DC | PRN
  Administered 2018-01-17: 200 mg via INTRAVENOUS

## 2018-01-17 MED ORDER — ONDANSETRON HCL 4 MG/2ML IJ SOLN
INTRAMUSCULAR | Status: DC | PRN
  Administered 2018-01-17: 4 mg via INTRAVENOUS

## 2018-01-17 SURGICAL SUPPLY — 66 items
APL SKNCLS STERI-STRIP NONHPOA (GAUZE/BANDAGES/DRESSINGS) ×2
BAG DECANTER FOR FLEXI CONT (MISCELLANEOUS) ×4 IMPLANT
BENZOIN TINCTURE PRP APPL 2/3 (GAUZE/BANDAGES/DRESSINGS) ×3 IMPLANT
BIT DRILL NEURO 2X3.1 SFT TUCH (MISCELLANEOUS) ×2 IMPLANT
BIT DRILL OCT ADJ 2.3 (BIT) ×3 IMPLANT
BLADE CLIPPER SURG (BLADE) ×3 IMPLANT
BUR MATCHSTICK NEURO 3.0 LAGG (BURR) ×3 IMPLANT
BUR PRECISION FLUTE 6.0 (BURR) ×3 IMPLANT
CANISTER SUCT 3000ML PPV (MISCELLANEOUS) ×4 IMPLANT
CAP CLSR POST CERV (Cap) ×30 IMPLANT
CARTRIDGE OIL MAESTRO DRILL (MISCELLANEOUS) ×2 IMPLANT
CLOSURE STERI-STRIP 1/2X4 (GAUZE/BANDAGES/DRESSINGS) ×1
CLOSURE WOUND 1/2 X4 (GAUZE/BANDAGES/DRESSINGS)
CLSR STERI-STRIP ANTIMIC 1/2X4 (GAUZE/BANDAGES/DRESSINGS) ×2 IMPLANT
DIFFUSER DRILL AIR PNEUMATIC (MISCELLANEOUS) ×4 IMPLANT
DRAPE C-ARM 42X72 X-RAY (DRAPES) ×8 IMPLANT
DRAPE LAPAROTOMY 100X72 PEDS (DRAPES) ×4 IMPLANT
DRAPE POUCH INSTRU U-SHP 10X18 (DRAPES) ×4 IMPLANT
DRAPE SURG 17X23 STRL (DRAPES) ×12 IMPLANT
DRILL NEURO 2X3.1 SOFT TOUCH (MISCELLANEOUS) ×4
DRSG OPSITE POSTOP 4X8 (GAUZE/BANDAGES/DRESSINGS) ×3 IMPLANT
ELECT REM PT RETURN 9FT ADLT (ELECTROSURGICAL) ×4
ELECTRODE REM PT RTRN 9FT ADLT (ELECTROSURGICAL) ×2 IMPLANT
GAUZE 4X4 16PLY RFD (DISPOSABLE) IMPLANT
GAUZE SPONGE 4X4 12PLY STRL (GAUZE/BANDAGES/DRESSINGS) ×3 IMPLANT
GLOVE BIO SURGEON STRL SZ7 (GLOVE) ×3 IMPLANT
GLOVE BIO SURGEON STRL SZ8 (GLOVE) ×4 IMPLANT
GLOVE BIO SURGEON STRL SZ8.5 (GLOVE) ×4 IMPLANT
GLOVE BIOGEL PI IND STRL 7.5 (GLOVE) ×1 IMPLANT
GLOVE BIOGEL PI INDICATOR 7.5 (GLOVE) ×2
GOWN STRL REUS W/ TWL LRG LVL3 (GOWN DISPOSABLE) ×2 IMPLANT
GOWN STRL REUS W/ TWL XL LVL3 (GOWN DISPOSABLE) ×4 IMPLANT
GOWN STRL REUS W/TWL 2XL LVL3 (GOWN DISPOSABLE) ×4 IMPLANT
GOWN STRL REUS W/TWL LRG LVL3 (GOWN DISPOSABLE) ×8
GOWN STRL REUS W/TWL XL LVL3 (GOWN DISPOSABLE) ×12
HEMOSTAT POWDER KIT SURGIFOAM (HEMOSTASIS) ×3 IMPLANT
KIT BASIN OR (CUSTOM PROCEDURE TRAY) ×4 IMPLANT
KIT INFUSE X SMALL 1.4CC (Orthopedic Implant) ×3 IMPLANT
KIT TURNOVER KIT B (KITS) ×4 IMPLANT
NDL HYPO 21X1.5 SAFETY (NEEDLE) IMPLANT
NDL SPNL 18GX3.5 QUINCKE PK (NEEDLE) IMPLANT
NEEDLE HYPO 21X1.5 SAFETY (NEEDLE) ×4 IMPLANT
NEEDLE HYPO 22GX1.5 SAFETY (NEEDLE) ×4 IMPLANT
NEEDLE SPNL 18GX3.5 QUINCKE PK (NEEDLE) IMPLANT
NS IRRIG 1000ML POUR BTL (IV SOLUTION) ×4 IMPLANT
OIL CARTRIDGE MAESTRO DRILL (MISCELLANEOUS) ×4
PACK LAMINECTOMY NEURO (CUSTOM PROCEDURE TRAY) ×4 IMPLANT
PAD ARMBOARD 7.5X6 YLW CONV (MISCELLANEOUS) ×12 IMPLANT
PATTIES SURGICAL .25X.25 (GAUZE/BANDAGES/DRESSINGS) IMPLANT
PIN MAYFIELD SKULL DISP (PIN) ×4 IMPLANT
ROD CVD VIRAGE 3.5X80 (Rod) ×6 IMPLANT
RUBBERBAND STERILE (MISCELLANEOUS) IMPLANT
SCREW VIRAGE 3.5X14 (Screw) ×30 IMPLANT
SPONGE LAP 4X18 RFD (DISPOSABLE) IMPLANT
SPONGE NEURO XRAY DETECT 1X3 (DISPOSABLE) IMPLANT
SPONGE SURGIFOAM ABS GEL 100 (HEMOSTASIS) ×3 IMPLANT
STAPLER SKIN PROX WIDE 3.9 (STAPLE) IMPLANT
STRIP CLOSURE SKIN 1/2X4 (GAUZE/BANDAGES/DRESSINGS) IMPLANT
SUT VIC AB 0 CT1 18XCR BRD8 (SUTURE) ×3 IMPLANT
SUT VIC AB 0 CT1 8-18 (SUTURE) ×8
SUT VIC AB 2-0 CP2 18 (SUTURE) ×4 IMPLANT
SUT VIC AB 3-0 SH 18 (SUTURE) IMPLANT
SYRINGE 20CC LL (MISCELLANEOUS) ×3 IMPLANT
TOWEL GREEN STERILE (TOWEL DISPOSABLE) ×4 IMPLANT
TOWEL GREEN STERILE FF (TOWEL DISPOSABLE) ×4 IMPLANT
WATER STERILE IRR 1000ML POUR (IV SOLUTION) ×4 IMPLANT

## 2018-01-17 NOTE — Progress Notes (Signed)
PT Cancellation Note  Patient Details Name: Brandon [name] MRN: [id number] DOB: Sep 27, 1940   Cancelled Treatment:    Reason Eval/Treat Not Completed: Other (comment).  Pending cervical spine decompression surgery this PM.   PT will follow up post op.  Thanks,    Rollene Rotundaebecca B. Odysseus Cada, PT, DPT [phone]   01/17/2018, 8:46 AM
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
## 2018-01-17 NOTE — Progress Notes (Signed)
Pt to short stay, family at bedside, report given.

## 2018-01-17 NOTE — Progress Notes (Signed)
OT Cancellation Note  Patient Details Name: Brandon [name] MRN: [id number] DOB: 27-Feb-1941   Cancelled Treatment:    Reason Eval/Treat Not Completed: Other (comment)(pending surgery today) OT evaluate after procedure (Dr. Lovell SheehanJenkins plans C3-7 posterior laminectomy and fusion 8/14 per trauma notes)  Felecia ShellingJones, [name]   [name], [name]   OTR/L Pager: [phone] Office: [phone] .  01/17/2018, 6:33 AM

## 2018-01-17 NOTE — Progress Notes (Signed)
SLP Cancellation Note  Patient Details Name: Brandon [name] MRN: [id number] DOB: 06-22-1940   Cancelled treatment:       Reason Eval Not Completed: Pt currently NPO for laminectomy later today. RN requests hold on BSE until tomorrow. Will continue efforts.  Celia B. Murvin NatalBueche, [facility] CenterMSP, CCC-SLP Speech Language Pathologist [phone]  Leigh AuroraBueche, Celia Brown 01/17/2018, 10:11 AM
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
## 2018-01-17 NOTE — Anesthesia Procedure Notes (Signed)
Procedure Name: Intubation Date/Time: 01/17/2018 5:40 PM Performed by: Rosiland OzMeyers, Nekesha Font, CRNA Pre-anesthesia Checklist: Patient identified, Emergency Drugs available, Suction available, Patient being monitored and Timeout performed Patient Re-evaluated:Patient Re-evaluated prior to induction Oxygen Delivery Method: Circle system utilized Preoxygenation: Pre-oxygenation with 100% oxygen Induction Type: IV induction Ventilation: Mask ventilation without difficulty Laryngoscope Size: Glidescope and 4 Grade View: Grade I Tube type: Oral Tube size: 7.5 mm Number of attempts: 1 Airway Equipment and Method: Stylet Placement Confirmation: ETT inserted through vocal cords under direct vision,  positive ETCO2 and breath sounds checked- equal and bilateral Secured at: 22 cm Tube secured with: Tape Dental Injury: Teeth and Oropharynx as per pre-operative assessment

## 2018-01-17 NOTE — Progress Notes (Signed)
Met with pt, fiance, daughter, and multiple other family members at bedside at their request, to discuss discharge planning.  Daughter concerned about discharge plans for pt, and expresses desire for pt to go to inpatient rehab unit.  We discussed that pt would be evaluated by PT/OT after surgery, and if recommended, then evaluated by rehab MD when consulted.  Fiance states that pt will come to her home upon discharge, if possible, and all family members will support to provide 24h care.  They seem open to considering paid caregivers to supplement care, if needed.  If CIR not an option, family may be willing to consider SNF placement.  Offered emotional support to patient and family; reassured them that Case Manager and CSW will be available to assist with disposition issues as they arise.  They appreciated my visit.    Reinaldo Raddle, RN, BSN  Trauma/Neuro ICU Case Manager [phone]

## 2018-01-17 NOTE — Anesthesia Preprocedure Evaluation (Addendum)
Anesthesia Evaluation  Patient identified by MRN, date of birth, ID band Patient awake    Reviewed: Allergy & Precautions, H&P , NPO status , Patient's Chart, lab work & pertinent test results  Airway Mallampati: II  TM Distance: >3 FB Neck ROM: Full    Dental no notable dental hx. (+) Teeth Intact, Dental Advisory Given   Pulmonary neg pulmonary ROS,    Pulmonary exam normal breath sounds clear to auscultation       Cardiovascular + dysrhythmias Atrial Fibrillation  Rhythm:Regular Rate:Normal     Neuro/Psych Cervical fracture w/quadraparesis negative neurological ROS  negative psych ROS   GI/Hepatic negative GI ROS, Neg liver ROS,   Endo/Other  negative endocrine ROS  Renal/GU negative Renal ROS  negative genitourinary   Musculoskeletal   Abdominal   Peds  Hematology negative hematology ROS (+)   Anesthesia Other Findings   Reproductive/Obstetrics negative OB ROS                            Anesthesia Physical Anesthesia Plan  ASA: III  Anesthesia Plan: General   Post-op Pain Management:    Induction: Intravenous  PONV Risk Score and Plan: 3 and Ondansetron, Dexamethasone and Treatment may vary due to age or medical condition  Airway Management Planned: Oral ETT and Video Laryngoscope Planned  Additional Equipment:   Intra-op Plan:   Post-operative Plan: Extubation in OR  Informed Consent: I have reviewed the patients History and Physical, chart, labs and discussed the procedure including the risks, benefits and alternatives for the proposed anesthesia with the patient or authorized representative who has indicated his/her understanding and acceptance.   Dental advisory given  Plan Discussed with: CRNA  Anesthesia Plan Comments:         Anesthesia Quick Evaluation
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
## 2018-01-17 NOTE — Transfer of Care (Signed)
Immediate Anesthesia Transfer of Care Note  Patient: Brandon [name]  Procedure(s) Performed: CERVICAL LAMINECTOMY FUSION/FORAMINOTOMY CERVICAL THREE-FOUR, CERVICAL FOUR-FIVE, CERVICAL FIVE-SIX, CERVICAL SIX-SEVEN (N/A Spine Cervical)  Patient Location: PACU  Anesthesia Type:General  Level of Consciousness: sedated and responds to stimulation  Airway & Oxygen Therapy: Patient Spontanous Breathing and Patient connected to nasal cannula oxygen  Post-op Assessment: Report given to RN and Post -op Vital signs reviewed and stable  Post vital signs: Reviewed and stable  Last Vitals:  Vitals Value Taken Time  BP 114/68 01/17/2018  8:55 PM  Temp    Pulse 78 01/17/2018  9:00 PM  Resp 20 01/17/2018  9:00 PM  SpO2 97 % 01/17/2018  9:00 PM  Vitals shown include unvalidated device data.  Last Pain:  Vitals:   01/17/18 1400  TempSrc:   PainSc: Asleep         Complications: No apparent anesthesia complications

## 2018-01-18 ENCOUNTER — Inpatient Hospital Stay (HOSPITAL_COMMUNITY): Payer: Medicare Other

## 2018-01-18 DIAGNOSIS — S14109S Unspecified injury at unspecified level of cervical spinal cord, sequela: Secondary | ICD-10-CM

## 2018-01-18 DIAGNOSIS — S129XXS Fracture of neck, unspecified, sequela: Secondary | ICD-10-CM

## 2018-01-18 DIAGNOSIS — S14106S Unspecified injury at C6 level of cervical spinal cord, sequela: Secondary | ICD-10-CM

## 2018-01-18 MED ORDER — FAMOTIDINE 20 MG PO TABS
20.0000 mg | ORAL_TABLET | Freq: Two times a day (BID) | ORAL | Status: DC
Start: 2018-01-18 — End: 2018-01-18

## 2018-01-18 MED ORDER — FAMOTIDINE 20 MG PO TABS
20.0000 mg | ORAL_TABLET | Freq: Two times a day (BID) | ORAL | Status: DC
  Administered 2018-01-18: 20 mg via ORAL
  Filled 2018-01-18: qty 1

## 2018-01-18 MED ORDER — ORAL CARE MOUTH RINSE
15.0000 mL | Freq: Two times a day (BID) | OROMUCOSAL | Status: DC
  Administered 2018-01-18 (×2): 15 mL via OROMUCOSAL

## 2018-01-18 MED FILL — Thrombin (Recombinant) For Soln 20000 Unit: CUTANEOUS | Qty: 1 | Status: AC

## 2018-01-18 NOTE — Progress Notes (Signed)
Subjective: The patient is alert and pleasant.  His fiance is at the bedside.  Objective: Vital signs in last 24 hours: Temp:  [97.7 F (36.5 C)-98.3 F (36.8 C)] 97.8 F (36.6 C) (08/15 0400) Pulse Rate:  [61-94] 78 (08/15 0700) Resp:  [10-23] 12 (08/15 0700) BP: (114-182)/(68-101) 150/96 (08/15 0700) SpO2:  [93 %-100 %] 97 % (08/15 0700) Weight:  [80.3 kg] 80.3 kg (08/14 1627) Estimated body mass index is 24 kg/m as calculated from the following:   Height as of this encounter: 6' 0.01" (1.829 m).   Weight as of this encounter: 80.3 kg.   Intake/Output from previous day: 08/14 0701 - 08/15 0700 In: 4025.6 [I.V.:3625.6; IV Piggyback:400] Out: 1500 [Urine:1250; Blood:250] Intake/Output this shift: No intake/output data recorded.  Physical exam the patient is alert and pleasant.  His motor exam has not changed and is as documented in his initial consultation.  Lab Results: Recent Labs    01/16/18 0637 01/17/18 0316  WBC 7.7 7.7  HGB 13.0 13.0  HCT 38.7* 39.7  PLT 153 146*   BMET Recent Labs    01/16/18 0637 01/17/18 0316  NA 137 134*  K 4.2 4.1  CL 106 105  CO2 24 25  GLUCOSE 103* 131*  BUN 15 14  CREATININE 0.79 0.71  CALCIUM 7.8* 8.0*    Studies/Results: Dg Cervical Spine 2-3 Views  Result Date: 01/17/2018 CLINICAL DATA:  Cervical laminectomy and fusion EXAM: DG C-ARM 61-120 MIN; CERVICAL SPINE - 2-3 VIEW COMPARISON:  MRI 01/15/2018, CT 01/15/2018 FINDINGS: Two low resolution intraoperative spot views of the cervical spine. Total fluoroscopy time was 10 seconds. Surgical retractor devices posterior to C3 and C4 with limited visualization of the lower cervical spine. AP view demonstrates placement of multiple pedicle screws. IMPRESSION: Intraoperative fluoroscopic assistance provided during cervical spine surgery Electronically Signed   By: Jasmine PangKim  Fujinaga M.D.   On: 01/17/2018 20:20   Dg C-arm 1-60 Min  Result Date: 01/17/2018 CLINICAL DATA:  Cervical  laminectomy and fusion EXAM: DG C-ARM 61-120 MIN; CERVICAL SPINE - 2-3 VIEW COMPARISON:  MRI 01/15/2018, CT 01/15/2018 FINDINGS: Two low resolution intraoperative spot views of the cervical spine. Total fluoroscopy time was 10 seconds. Surgical retractor devices posterior to C3 and C4 with limited visualization of the lower cervical spine. AP view demonstrates placement of multiple pedicle screws. IMPRESSION: Intraoperative fluoroscopic assistance provided during cervical spine surgery Electronically Signed   By: Jasmine PangKim  Fujinaga M.D.   On: 01/17/2018 20:20    Assessment/Plan: Postop day #1: The patient is doing well.  I have asked rehab to see the patient.  We will get to mobilize the patient.  LOS: 3 days     Cristi LoronJeffrey D Arin Peral 01/18/2018, 8:07 AM
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
## 2018-01-18 NOTE — Progress Notes (Signed)
Inpatient Rehabilitation-Admissions Coordinator    AC attempted to follow up with pt and family regarding PM&R consult; At this time, pt is off the floor for CT scan. AC will follow up with pt and the family tomorrow.   Please call if questions.   Nanine MeansKelly Miria Cappelli, OTR/L  Rehab Admissions Coordinator  [phone] 01/18/2018 2:46 PM
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
## 2018-01-18 NOTE — Progress Notes (Signed)
Subjective: By report the patient took a fall this afternoon.  A head and cervical CT was obtained which were unremarkable.  His daughter is at the bedside.  He is somnolent as he has been recently medicated.  He is in no apparent distress.  Objective: Vital signs in last 24 hours: Temp:  [97.4 F (36.3 C)-98.3 F (36.8 C)] 97.4 F (36.3 C) (08/15 1500) Pulse Rate:  [63-94] 63 (08/15 1300) Resp:  [10-23] 18 (08/15 1300) BP: (114-182)/(68-101) 134/80 (08/15 1300) SpO2:  [96 %-100 %] 100 % (08/15 1300) Weight:  [80.3 kg] 80.3 kg (08/14 1627) Estimated body mass index is 24 kg/m as calculated from the following:   Height as of this encounter: 6' 0.01" (1.829 m).   Weight as of this encounter: 80.3 kg.   Intake/Output from previous day: 08/14 0701 - 08/15 0700 In: 4025.6 [I.V.:3625.6; IV Piggyback:400] Out: 1500 [Urine:1250; Blood:250] Intake/Output this shift: Total I/O In: 1149.7 [I.V.:599.7; Other:500; IV Piggyback:50] Out: -   Physical exam the patient is somnolent but easily arousable.  There is no changes motor exam.  He has some Betadine from his surgery on the front of his neck.  I reviewed the patient's cervical and head CT performed this afternoon.  There is good position of the instrumentation from C3-C7.  His head scan is unremarkable.  Lab Results: Recent Labs    01/16/18 0637 01/17/18 0316  WBC 7.7 7.7  HGB 13.0 13.0  HCT 38.7* 39.7  PLT 153 146*   BMET Recent Labs    01/16/18 0637 01/17/18 0316  NA 137 134*  K 4.2 4.1  CL 106 105  CO2 24 25  GLUCOSE 103* 131*  BUN 15 14  CREATININE 0.79 0.71  CALCIUM 7.8* 8.0*    Studies/Results: Dg Cervical Spine 2-3 Views  Result Date: 01/17/2018 CLINICAL DATA:  Cervical laminectomy and fusion EXAM: DG C-ARM 61-120 MIN; CERVICAL SPINE - 2-3 VIEW COMPARISON:  MRI 01/15/2018, CT 01/15/2018 FINDINGS: Two low resolution intraoperative spot views of the cervical spine. Total fluoroscopy time was 10 seconds.  Surgical retractor devices posterior to C3 and C4 with limited visualization of the lower cervical spine. AP view demonstrates placement of multiple pedicle screws. IMPRESSION: Intraoperative fluoroscopic assistance provided during cervical spine surgery Electronically Signed   By: Jasmine PangKim  Fujinaga M.D.   On: 01/17/2018 20:20   Ct Head Wo Contrast  Result Date: 01/18/2018 CLINICAL DATA:  C3-C7 fusion yesterday. Fall during physical therapy. EXAM: CT HEAD WITHOUT CONTRAST CT CERVICAL SPINE WITHOUT CONTRAST TECHNIQUE: Multidetector CT imaging of the head and cervical spine was performed following the standard protocol without intravenous contrast. Multiplanar CT image reconstructions of the cervical spine were also generated. COMPARISON:  CT head and cervical spine dated January 15, 2018. FINDINGS: CT HEAD FINDINGS Brain: No evidence of acute infarction, hemorrhage, hydrocephalus, extra-axial collection or mass lesion/mass effect. Stable mild atrophy and chronic microvascular ischemic changes. Vascular: Calcified atherosclerosis at the skullbase. No hyperdense vessel. Skull: Negative for fracture or focal lesion. Sinuses/Orbits: Unchanged left mastoid effusion. The orbits are unremarkable. Other: None. CT CERVICAL SPINE FINDINGS Alignment: Mild widening of the bilateral C5-C6 facet joints is unchanged. Otherwise normal. Skull base and vertebrae: Interval C3-C7 posterior decompression and fusion. Prior C6-C7 ACDF with solid osseous fusion. No evidence of hardware failure or loosening. Unchanged fractures involving the bilateral inferior articular processes of C5. Unchanged right superior articular process of C6 extending into the transverse foramen. No new acute fracture. Soft tissues and spinal canal: Normal prevertebral soft  tissues. Small amount of air within the upper cervical epidural space is likely postsurgical. No visible canal hematoma. Disc levels:  Unchanged moderate central disc protrusion at C3-C4. Upper  chest: Negative. Other: Expected postsurgical fluid and subcutaneous emphysema in the posterior paraspinous soft tissues. IMPRESSION: 1.  No acute intracranial abnormality. 2. Interval C3-C7 posterior decompression and fusion. No hardware complication. 3. No new acute cervical spine fracture. Electronically Signed   By: Obie DredgeWilliam T Derry M.D.   On: 01/18/2018 15:19   Ct Cervical Spine Wo Contrast  Result Date: 01/18/2018 CLINICAL DATA:  C3-C7 fusion yesterday. Fall during physical therapy. EXAM: CT HEAD WITHOUT CONTRAST CT CERVICAL SPINE WITHOUT CONTRAST TECHNIQUE: Multidetector CT imaging of the head and cervical spine was performed following the standard protocol without intravenous contrast. Multiplanar CT image reconstructions of the cervical spine were also generated. COMPARISON:  CT head and cervical spine dated January 15, 2018. FINDINGS: CT HEAD FINDINGS Brain: No evidence of acute infarction, hemorrhage, hydrocephalus, extra-axial collection or mass lesion/mass effect. Stable mild atrophy and chronic microvascular ischemic changes. Vascular: Calcified atherosclerosis at the skullbase. No hyperdense vessel. Skull: Negative for fracture or focal lesion. Sinuses/Orbits: Unchanged left mastoid effusion. The orbits are unremarkable. Other: None. CT CERVICAL SPINE FINDINGS Alignment: Mild widening of the bilateral C5-C6 facet joints is unchanged. Otherwise normal. Skull base and vertebrae: Interval C3-C7 posterior decompression and fusion. Prior C6-C7 ACDF with solid osseous fusion. No evidence of hardware failure or loosening. Unchanged fractures involving the bilateral inferior articular processes of C5. Unchanged right superior articular process of C6 extending into the transverse foramen. No new acute fracture. Soft tissues and spinal canal: Normal prevertebral soft tissues. Small amount of air within the upper cervical epidural space is likely postsurgical. No visible canal hematoma. Disc levels:  Unchanged  moderate central disc protrusion at C3-C4. Upper chest: Negative. Other: Expected postsurgical fluid and subcutaneous emphysema in the posterior paraspinous soft tissues. IMPRESSION: 1.  No acute intracranial abnormality. 2. Interval C3-C7 posterior decompression and fusion. No hardware complication. 3. No new acute cervical spine fracture. Electronically Signed   By: Obie DredgeWilliam T Derry M.D.   On: 01/18/2018 15:19   Dg C-arm 1-60 Min  Result Date: 01/17/2018 CLINICAL DATA:  Cervical laminectomy and fusion EXAM: DG C-ARM 61-120 MIN; CERVICAL SPINE - 2-3 VIEW COMPARISON:  MRI 01/15/2018, CT 01/15/2018 FINDINGS: Two low resolution intraoperative spot views of the cervical spine. Total fluoroscopy time was 10 seconds. Surgical retractor devices posterior to C3 and C4 with limited visualization of the lower cervical spine. AP view demonstrates placement of multiple pedicle screws. IMPRESSION: Intraoperative fluoroscopic assistance provided during cervical spine surgery Electronically Signed   By: Jasmine PangKim  Fujinaga M.D.   On: 01/17/2018 20:20    Assessment/Plan: Postop day #1: The patient does not appear to be injured from his fall.  His scans look good.  We are awaiting rehab placement.  I spoke with his daughter.  LOS: 3 days     Cristi LoronJeffrey D Remigio Mcmillon 01/18/2018, 4:22 PM
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
## 2018-01-18 NOTE — Progress Notes (Signed)
Patient ID: Laray AngerMichael Brosch, male   DOB: December 02, 1940, 77 y.o.   MRN: [id number] CT head and c spine reviewed with the radiologist. No new unexpected findings. I discussed the findings with the patient's daughter.   Violeta GelinasBurke Zayvien Canning, MD, MPH, FACS Trauma: [phone] General Surgery: [phone]
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
## 2018-01-18 NOTE — Progress Notes (Signed)
Orthopedic Tech Progress Note Patient Details:  Laray AngerMichael Borenstein 10-14-1940 [id number]  Ortho Devices Ortho Device/Splint Location: Bilateral heel boots       Saul FordyceJennifer C Pranav Lince 01/18/2018, 2:55 PM

## 2018-01-19 ENCOUNTER — Inpatient Hospital Stay (HOSPITAL_COMMUNITY)
Admission: RE | Admit: 2018-01-19 | Payer: Medicare Other | Source: Intra-hospital | Admitting: Physical Medicine & Rehabilitation

## 2018-01-19 ENCOUNTER — Inpatient Hospital Stay (HOSPITAL_COMMUNITY)
Admission: RE | Admit: 2018-01-19 | Discharge: 2018-02-27 | DRG: 945 | Disposition: A | Discharge status: Skilled Nursing Facility | Payer: Medicare Other | Source: Intra-hospital | Attending: Physical Medicine & Rehabilitation | Admitting: Physical Medicine & Rehabilitation

## 2018-01-19 ENCOUNTER — Other Ambulatory Visit: Payer: Self-pay

## 2018-01-19 ENCOUNTER — Encounter (HOSPITAL_COMMUNITY): Payer: Self-pay | Admitting: Nurse Practitioner

## 2018-01-19 DIAGNOSIS — N400 Enlarged prostate without lower urinary tract symptoms: Secondary | ICD-10-CM | POA: Diagnosis present

## 2018-01-19 DIAGNOSIS — D62 Acute posthemorrhagic anemia: Secondary | ICD-10-CM | POA: Diagnosis not present

## 2018-01-19 DIAGNOSIS — S12400D Unspecified displaced fracture of fifth cervical vertebra, subsequent encounter for fracture with routine healing: Secondary | ICD-10-CM | POA: Diagnosis not present

## 2018-01-19 DIAGNOSIS — G8918 Other acute postprocedural pain: Secondary | ICD-10-CM | POA: Diagnosis not present

## 2018-01-19 DIAGNOSIS — I951 Orthostatic hypotension: Secondary | ICD-10-CM | POA: Diagnosis not present

## 2018-01-19 DIAGNOSIS — G4710 Hypersomnia, unspecified: Secondary | ICD-10-CM | POA: Diagnosis not present

## 2018-01-19 DIAGNOSIS — R451 Restlessness and agitation: Secondary | ICD-10-CM | POA: Diagnosis not present

## 2018-01-19 DIAGNOSIS — Z981 Arthrodesis status: Secondary | ICD-10-CM | POA: Diagnosis not present

## 2018-01-19 DIAGNOSIS — N319 Neuromuscular dysfunction of bladder, unspecified: Secondary | ICD-10-CM

## 2018-01-19 DIAGNOSIS — I480 Paroxysmal atrial fibrillation: Secondary | ICD-10-CM

## 2018-01-19 DIAGNOSIS — E876 Hypokalemia: Secondary | ICD-10-CM | POA: Diagnosis not present

## 2018-01-19 DIAGNOSIS — S069X3S Unspecified intracranial injury with loss of consciousness of 1 hour to 5 hours 59 minutes, sequela: Secondary | ICD-10-CM | POA: Diagnosis not present

## 2018-01-19 DIAGNOSIS — Z23 Encounter for immunization: Secondary | ICD-10-CM | POA: Diagnosis not present

## 2018-01-19 DIAGNOSIS — R0989 Other specified symptoms and signs involving the circulatory and respiratory systems: Secondary | ICD-10-CM

## 2018-01-19 DIAGNOSIS — F1010 Alcohol abuse, uncomplicated: Secondary | ICD-10-CM | POA: Diagnosis not present

## 2018-01-19 DIAGNOSIS — N390 Urinary tract infection, site not specified: Secondary | ICD-10-CM | POA: Diagnosis not present

## 2018-01-19 DIAGNOSIS — S14106D Unspecified injury at C6 level of cervical spinal cord, subsequent encounter: Secondary | ICD-10-CM | POA: Diagnosis present

## 2018-01-19 DIAGNOSIS — R630 Anorexia: Secondary | ICD-10-CM | POA: Diagnosis not present

## 2018-01-19 DIAGNOSIS — Z888 Allergy status to other drugs, medicaments and biological substances status: Secondary | ICD-10-CM

## 2018-01-19 DIAGNOSIS — R442 Other hallucinations: Secondary | ICD-10-CM

## 2018-01-19 DIAGNOSIS — G9519 Other vascular myelopathies: Secondary | ICD-10-CM | POA: Diagnosis present

## 2018-01-19 DIAGNOSIS — R4781 Slurred speech: Secondary | ICD-10-CM | POA: Diagnosis present

## 2018-01-19 DIAGNOSIS — S14106S Unspecified injury at C6 level of cervical spinal cord, sequela: Secondary | ICD-10-CM

## 2018-01-19 DIAGNOSIS — G4733 Obstructive sleep apnea (adult) (pediatric): Secondary | ICD-10-CM | POA: Diagnosis present

## 2018-01-19 DIAGNOSIS — R1312 Dysphagia, oropharyngeal phase: Secondary | ICD-10-CM | POA: Diagnosis not present

## 2018-01-19 DIAGNOSIS — Z79899 Other long term (current) drug therapy: Secondary | ICD-10-CM | POA: Diagnosis not present

## 2018-01-19 DIAGNOSIS — R443 Hallucinations, unspecified: Secondary | ICD-10-CM | POA: Diagnosis not present

## 2018-01-19 DIAGNOSIS — G8250 Quadriplegia, unspecified: Secondary | ICD-10-CM | POA: Diagnosis present

## 2018-01-19 DIAGNOSIS — G4730 Sleep apnea, unspecified: Secondary | ICD-10-CM

## 2018-01-19 DIAGNOSIS — R5383 Other fatigue: Secondary | ICD-10-CM | POA: Diagnosis present

## 2018-01-19 DIAGNOSIS — S069X0D Unspecified intracranial injury without loss of consciousness, subsequent encounter: Secondary | ICD-10-CM | POA: Diagnosis not present

## 2018-01-19 DIAGNOSIS — B952 Enterococcus as the cause of diseases classified elsewhere: Secondary | ICD-10-CM | POA: Diagnosis not present

## 2018-01-19 DIAGNOSIS — Z882 Allergy status to sulfonamides status: Secondary | ICD-10-CM

## 2018-01-19 DIAGNOSIS — I10 Essential (primary) hypertension: Secondary | ICD-10-CM

## 2018-01-19 DIAGNOSIS — I482 Chronic atrial fibrillation: Secondary | ICD-10-CM

## 2018-01-19 DIAGNOSIS — R441 Visual hallucinations: Secondary | ICD-10-CM | POA: Diagnosis present

## 2018-01-19 DIAGNOSIS — I4891 Unspecified atrial fibrillation: Secondary | ICD-10-CM | POA: Diagnosis present

## 2018-01-19 DIAGNOSIS — R400 Somnolence: Secondary | ICD-10-CM | POA: Diagnosis present

## 2018-01-19 DIAGNOSIS — R1310 Dysphagia, unspecified: Secondary | ICD-10-CM

## 2018-01-19 DIAGNOSIS — F329 Major depressive disorder, single episode, unspecified: Secondary | ICD-10-CM | POA: Diagnosis present

## 2018-01-19 DIAGNOSIS — K592 Neurogenic bowel, not elsewhere classified: Secondary | ICD-10-CM

## 2018-01-19 DIAGNOSIS — R4182 Altered mental status, unspecified: Secondary | ICD-10-CM | POA: Diagnosis not present

## 2018-01-19 DIAGNOSIS — F419 Anxiety disorder, unspecified: Secondary | ICD-10-CM | POA: Diagnosis not present

## 2018-01-19 DIAGNOSIS — Z7901 Long term (current) use of anticoagulants: Secondary | ICD-10-CM

## 2018-01-19 DIAGNOSIS — R634 Abnormal weight loss: Secondary | ICD-10-CM | POA: Diagnosis not present

## 2018-01-19 DIAGNOSIS — S12500D Unspecified displaced fracture of sixth cervical vertebra, subsequent encounter for fracture with routine healing: Secondary | ICD-10-CM

## 2018-01-19 MED ORDER — OXYCODONE HCL 5 MG PO TABS
5.0000 mg | ORAL_TABLET | ORAL | Status: DC | PRN
  Filled 2018-01-19: qty 1

## 2018-01-19 MED ORDER — TAMSULOSIN HCL 0.4 MG PO CAPS
0.4000 mg | ORAL_CAPSULE | Freq: Every day | ORAL | Status: DC
  Filled 2018-01-19: qty 1

## 2018-01-19 MED ORDER — DOCUSATE SODIUM 100 MG PO CAPS
100.0000 mg | ORAL_CAPSULE | Freq: Two times a day (BID) | ORAL | Status: DC
  Administered 2018-01-19 – 2018-01-30 (×21): 100 mg via ORAL
  Filled 2018-01-19 (×21): qty 1

## 2018-01-19 MED ORDER — ACETAMINOPHEN 325 MG PO TABS
650.0000 mg | ORAL_TABLET | ORAL | Status: DC | PRN
  Administered 2018-01-20 – 2018-01-22 (×5): 650 mg via ORAL
  Filled 2018-01-19 (×6): qty 2

## 2018-01-19 MED ORDER — METOPROLOL TARTRATE 25 MG PO TABS
25.0000 mg | ORAL_TABLET | Freq: Two times a day (BID) | ORAL | Status: DC
  Administered 2018-01-19 – 2018-02-06 (×32): 25 mg via ORAL
  Filled 2018-01-19 (×38): qty 1

## 2018-01-19 MED ORDER — WARFARIN - PHARMACIST DOSING INPATIENT
Freq: Every day | Status: DC
  Administered 2018-01-23 – 2018-02-26 (×10)

## 2018-01-19 MED ORDER — BISACODYL 10 MG RE SUPP
10.0000 mg | Freq: Every day | RECTAL | Status: DC | PRN
  Administered 2018-01-24: 10 mg via RECTAL
  Filled 2018-01-19: qty 1

## 2018-01-19 MED ORDER — ONDANSETRON HCL 4 MG/2ML IJ SOLN: 4.0000 mg | Freq: Four times a day (QID) | INTRAMUSCULAR | Status: DC | PRN

## 2018-01-19 MED ORDER — PREGABALIN 50 MG PO CAPS
100.0000 mg | ORAL_CAPSULE | Freq: Two times a day (BID) | ORAL | Status: DC
  Administered 2018-01-19: 100 mg via ORAL
  Filled 2018-01-19: qty 2

## 2018-01-19 MED ORDER — ACETAMINOPHEN 650 MG RE SUPP: 650.0000 mg | RECTAL | Status: DC | PRN

## 2018-01-19 MED ORDER — LISINOPRIL 20 MG PO TABS
20.0000 mg | ORAL_TABLET | Freq: Every day | ORAL | Status: DC
  Administered 2018-01-20 – 2018-02-05 (×13): 20 mg via ORAL
  Filled 2018-01-19 (×19): qty 1

## 2018-01-19 MED ORDER — ESCITALOPRAM OXALATE 10 MG PO TABS
10.0000 mg | ORAL_TABLET | Freq: Every day | ORAL | Status: DC
  Administered 2018-01-20 – 2018-01-25 (×6): 10 mg via ORAL
  Filled 2018-01-19 (×6): qty 1

## 2018-01-19 MED ORDER — ONDANSETRON HCL 4 MG PO TABS
4.0000 mg | ORAL_TABLET | Freq: Four times a day (QID) | ORAL | Status: DC | PRN
  Filled 2018-01-19 (×2): qty 1

## 2018-01-19 MED ORDER — FAMOTIDINE 20 MG PO TABS
20.0000 mg | ORAL_TABLET | Freq: Two times a day (BID) | ORAL | Status: DC
  Administered 2018-01-19 – 2018-01-29 (×20): 20 mg via ORAL
  Filled 2018-01-19 (×20): qty 1

## 2018-01-19 MED ORDER — CYCLOBENZAPRINE HCL 10 MG PO TABS
10.0000 mg | ORAL_TABLET | Freq: Three times a day (TID) | ORAL | Status: DC | PRN
  Administered 2018-01-24: 10 mg via ORAL
  Filled 2018-01-19: qty 1

## 2018-01-19 NOTE — Progress Notes (Signed)
Wrong time
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
## 2018-01-19 NOTE — Progress Notes (Signed)
Inpatient Rehabilitation-Admissions Coordinator    Met with patient and his daugther at the bedside to discuss team's recommendation for inpatient rehabilitation. Shared booklets, expectations while in CIR, expected length of stay, and anticipated functional level at DC. Pt and family want to pursue CIR at this time.  AC has received medical approval for admit to CIR. AC has updated CM, SW, Floor RN, and family on plan.   Please call if questions.   Jhonnie Garner, OTR/L  Rehab Admissions Coordinator  [phone] 01/19/2018 11:45 AM

## 2018-01-19 NOTE — Progress Notes (Signed)
Discharge orders received, pt for discharge to CIR this afternoon.  Family at bedside, staff transferred pt via bed to [phone].

## 2018-01-19 NOTE — Progress Notes (Signed)
Subjective: The patient is somnolent but arousable.  His fiance is at the bedside.  He is in no apparent distress.  Objective: Vital signs in last 24 hours: Temp:  [97.2 F (36.2 C)-98.3 F (36.8 C)] 98.3 F (36.8 C) (08/16 0407) Pulse Rate:  [56-82] 66 (08/16 0600) Resp:  [9-28] 14 (08/16 0600) BP: (109-150)/(62-97) 112/73 (08/16 0600) SpO2:  [93 %-100 %] 93 % (08/16 0600) Estimated body mass index is 24 kg/m as calculated from the following:   Height as of this encounter: 6' 0.01" (1.829 m).   Weight as of this encounter: 80.3 kg.   Intake/Output from previous day: 08/15 0701 - 08/16 0700 In: 2199.9 [I.V.:1649.9; IV Piggyback:50] Out: 350 [Urine:350] Intake/Output this shift: Total I/O In: 825.1 [I.V.:825.1] Out: 350 [Urine:350]  Physical exam patient is somnolent but arousable.  He is moving all 4 extremities.  Lab Results: Recent Labs    01/17/18 0316  WBC 7.7  HGB 13.0  HCT 39.7  PLT 146*   BMET Recent Labs    01/17/18 0316  NA 134*  K 4.1  CL 105  CO2 25  GLUCOSE 131*  BUN 14  CREATININE 0.71  CALCIUM 8.0*    Studies/Results: Dg Cervical Spine 2-3 Views  Result Date: 01/17/2018 CLINICAL DATA:  Cervical laminectomy and fusion EXAM: DG C-ARM 61-120 MIN; CERVICAL SPINE - 2-3 VIEW COMPARISON:  MRI 01/15/2018, CT 01/15/2018 FINDINGS: Two low resolution intraoperative spot views of the cervical spine. Total fluoroscopy time was 10 seconds. Surgical retractor devices posterior to C3 and C4 with limited visualization of the lower cervical spine. AP view demonstrates placement of multiple pedicle screws. IMPRESSION: Intraoperative fluoroscopic assistance provided during cervical spine surgery Electronically Signed   By: Jasmine PangKim  Fujinaga M.D.   On: 01/17/2018 20:20   Ct Head Wo Contrast  Result Date: 01/18/2018 CLINICAL DATA:  C3-C7 fusion yesterday. Fall during physical therapy. EXAM: CT HEAD WITHOUT CONTRAST CT CERVICAL SPINE WITHOUT CONTRAST TECHNIQUE:  Multidetector CT imaging of the head and cervical spine was performed following the standard protocol without intravenous contrast. Multiplanar CT image reconstructions of the cervical spine were also generated. COMPARISON:  CT head and cervical spine dated January 15, 2018. FINDINGS: CT HEAD FINDINGS Brain: No evidence of acute infarction, hemorrhage, hydrocephalus, extra-axial collection or mass lesion/mass effect. Stable mild atrophy and chronic microvascular ischemic changes. Vascular: Calcified atherosclerosis at the skullbase. No hyperdense vessel. Skull: Negative for fracture or focal lesion. Sinuses/Orbits: Unchanged left mastoid effusion. The orbits are unremarkable. Other: None. CT CERVICAL SPINE FINDINGS Alignment: Mild widening of the bilateral C5-C6 facet joints is unchanged. Otherwise normal. Skull base and vertebrae: Interval C3-C7 posterior decompression and fusion. Prior C6-C7 ACDF with solid osseous fusion. No evidence of hardware failure or loosening. Unchanged fractures involving the bilateral inferior articular processes of C5. Unchanged right superior articular process of C6 extending into the transverse foramen. No new acute fracture. Soft tissues and spinal canal: Normal prevertebral soft tissues. Small amount of air within the upper cervical epidural space is likely postsurgical. No visible canal hematoma. Disc levels:  Unchanged moderate central disc protrusion at C3-C4. Upper chest: Negative. Other: Expected postsurgical fluid and subcutaneous emphysema in the posterior paraspinous soft tissues. IMPRESSION: 1.  No acute intracranial abnormality. 2. Interval C3-C7 posterior decompression and fusion. No hardware complication. 3. No new acute cervical spine fracture. Electronically Signed   By: Obie DredgeWilliam T Derry M.D.   On: 01/18/2018 15:19   Ct Cervical Spine Wo Contrast  Result Date: 01/18/2018 CLINICAL DATA:  C3-C7 fusion yesterday. Fall during physical therapy. EXAM: CT HEAD WITHOUT  CONTRAST CT CERVICAL SPINE WITHOUT CONTRAST TECHNIQUE: Multidetector CT imaging of the head and cervical spine was performed following the standard protocol without intravenous contrast. Multiplanar CT image reconstructions of the cervical spine were also generated. COMPARISON:  CT head and cervical spine dated January 15, 2018. FINDINGS: CT HEAD FINDINGS Brain: No evidence of acute infarction, hemorrhage, hydrocephalus, extra-axial collection or mass lesion/mass effect. Stable mild atrophy and chronic microvascular ischemic changes. Vascular: Calcified atherosclerosis at the skullbase. No hyperdense vessel. Skull: Negative for fracture or focal lesion. Sinuses/Orbits: Unchanged left mastoid effusion. The orbits are unremarkable. Other: None. CT CERVICAL SPINE FINDINGS Alignment: Mild widening of the bilateral C5-C6 facet joints is unchanged. Otherwise normal. Skull base and vertebrae: Interval C3-C7 posterior decompression and fusion. Prior C6-C7 ACDF with solid osseous fusion. No evidence of hardware failure or loosening. Unchanged fractures involving the bilateral inferior articular processes of C5. Unchanged right superior articular process of C6 extending into the transverse foramen. No new acute fracture. Soft tissues and spinal canal: Normal prevertebral soft tissues. Small amount of air within the upper cervical epidural space is likely postsurgical. No visible canal hematoma. Disc levels:  Unchanged moderate central disc protrusion at C3-C4. Upper chest: Negative. Other: Expected postsurgical fluid and subcutaneous emphysema in the posterior paraspinous soft tissues. IMPRESSION: 1.  No acute intracranial abnormality. 2. Interval C3-C7 posterior decompression and fusion. No hardware complication. 3. No new acute cervical spine fracture. Electronically Signed   By: Obie DredgeWilliam T Derry M.D.   On: 01/18/2018 15:19   Dg C-arm 1-60 Min  Result Date: 01/17/2018 CLINICAL DATA:  Cervical laminectomy and fusion EXAM:  DG C-ARM 61-120 MIN; CERVICAL SPINE - 2-3 VIEW COMPARISON:  MRI 01/15/2018, CT 01/15/2018 FINDINGS: Two low resolution intraoperative spot views of the cervical spine. Total fluoroscopy time was 10 seconds. Surgical retractor devices posterior to C3 and C4 with limited visualization of the lower cervical spine. AP view demonstrates placement of multiple pedicle screws. IMPRESSION: Intraoperative fluoroscopic assistance provided during cervical spine surgery Electronically Signed   By: Jasmine PangKim  Fujinaga M.D.   On: 01/17/2018 20:20    Assessment/Plan: Postop day #2: The patient is doing well.  He is ready for rehab when a bed is available.  I spoke with his fiance.  LOS: 4 days     Cristi LoronJeffrey D Jag Lenz 01/19/2018, 6:49 AM

## 2018-01-19 NOTE — Anesthesia Postprocedure Evaluation (Signed)
Anesthesia Post Note  Patient: Laray AngerMichael Vanleer  Procedure(s) Performed: CERVICAL LAMINECTOMY FUSION/FORAMINOTOMY CERVICAL THREE-FOUR, CERVICAL FOUR-FIVE, CERVICAL FIVE-SIX, CERVICAL SIX-SEVEN (N/A Spine Cervical)     Patient location during evaluation: PACU Anesthesia Type: General Level of consciousness: awake Pain management: pain level controlled Vital Signs Assessment: post-procedure vital signs reviewed and stable Respiratory status: spontaneous breathing Cardiovascular status: stable Postop Assessment: no apparent nausea or vomiting Anesthetic complications: no    Last Vitals:  Vitals:   01/19/18 0700 01/19/18 0837  BP: 118/72 (!) 107/59  Pulse: 80   Resp: 15   Temp:  36.8 C  SpO2: 98%     Last Pain:  Vitals:   01/19/18 0837  TempSrc: Oral  PainSc:                  Dametri Ozburn
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
## 2018-01-19 NOTE — Progress Notes (Signed)
Patient ID: Brandon [name], male   DOB: [birth year], 77 y.o.   MRN: [id number] Patient admitted to 4W09 via bed, escorted by nursing staff and family.  Patient unable to answer questions due to altered mental status.  Went over rehab expectations with family and answered questions regarding processes here.  Family concerned about fluctuating mental status.  Daughter also wants staff to know that she is the POA and that all medical decisions should be deferred to her if the patient cannot verbalize wishes.  Patients fiance is NOT to make any medical decisions regarding patients care, however may be told medical information.  Patient is aroused with verbal stimulation but is quick to fall asleep mid conversation.  Patient had incontinent bowel movement during bed mobility.  Patient has rash to upper back.  ?MASD to perineal/groin area.  Foley intact.  Will continue to monitor.  Dani Gobbleeardon, Keiosha Cancro J, RN
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
## 2018-01-19 NOTE — Plan of Care (Signed)
Patient received on unit this afternoon. Vitals WNL, no complants of pain.  Resting in bed.
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
## 2018-01-19 NOTE — Progress Notes (Signed)
Patient is alergic to sulfa drugs.  Did not acknowledge Flomax med.

## 2018-01-20 ENCOUNTER — Inpatient Hospital Stay (HOSPITAL_COMMUNITY): Payer: Medicare Other | Admitting: Physical Therapy

## 2018-01-20 ENCOUNTER — Inpatient Hospital Stay (HOSPITAL_COMMUNITY): Payer: Medicare Other

## 2018-01-20 ENCOUNTER — Inpatient Hospital Stay (HOSPITAL_COMMUNITY): Payer: Medicare Other | Admitting: Occupational Therapy

## 2018-01-20 DIAGNOSIS — R5383 Other fatigue: Secondary | ICD-10-CM

## 2018-01-20 MED ORDER — PREGABALIN 50 MG PO CAPS: 50.0000 mg | ORAL_CAPSULE | Freq: Two times a day (BID) | ORAL | Status: DC

## 2018-01-20 NOTE — Progress Notes (Signed)
Patient more alert this morning.  Still confused but easier to arouse.  Trying to limit use of stronger pain medications as this may be playing a role in patients confusion and somnolence (per patient daughter).  Held morning dose of Lyrica with approval from Dr. Wynn BankerKirsteins (he says it is ok to hold medication for now).  Premedicated patient for pain with tylenol 650 mg po. Inquired about removing foley catheter to start voiding trial.  Dr. Wynn BankerKirsteins will review chart and advise.  Dani Gobbleeardon, Avarie Tavano J, RN

## 2018-01-20 NOTE — Plan of Care (Signed)
LTGs established 01/20/18
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
## 2018-01-20 NOTE — Plan of Care (Signed)
  Problem: RH SKIN INTEGRITY Goal: RH STG SKIN FREE OF INFECTION/BREAKDOWN Description Patients skin will remain free from further infection or breakdown with mod assist.  Outcome: Progressing Goal: RH STG MAINTAIN SKIN INTEGRITY WITH ASSISTANCE Description STG Maintain Skin Integrity With mod Assistance.  Outcome: Progressing Goal: RH STG ABLE TO PERFORM INCISION/WOUND CARE W/ASSISTANCE Description STG Able To Perform Incision/Wound Care With total Assistance from caregiver .  Outcome: Progressing   Problem: RH SAFETY Goal: RH STG ADHERE TO SAFETY PRECAUTIONS W/ASSISTANCE/DEVICE Description STG Adhere to Safety Precautions With mod Assistance/Device.  Outcome: Progressing   Problem: RH PAIN MANAGEMENT Goal: RH STG PAIN MANAGED AT OR BELOW PT'S PAIN GOAL Description < 4  Outcome: Progressing   Problem: SCI BOWEL ELIMINATION Goal: RH STG MANAGE BOWEL WITH ASSISTANCE Description STG Manage Bowel with mod Assistance.  Outcome: Not Progressing Goal: RH STG SCI MANAGE BOWEL PROGRAM W/ASSIST OR AS APPROPRIATE Description STG SCI Manage bowel program w/mod assist or as appropriate.  Outcome: Not Progressing   Problem: SCI BLADDER ELIMINATION Goal: RH STG MANAGE BLADDER WITH ASSISTANCE Description STG Manage Bladder With mod Assistance  Outcome: Not Progressing Goal: RH STG MANAGE BLADDER WITH MEDICATION WITH ASSISTANCE Description STG Manage Bladder With Medication With mod Assistance.  Outcome: Not Progressing Goal: RH STG MANAGE BLADDER WITH EQUIPMENT WITH ASSISTANCE Description STG Manage Bladder With Equipment With mod Assistance  Outcome: Not Progressing Goal: RH STG SCI MANAGE BLADDER PROGRAM W/ASSISTANCE Description Mod assist  Outcome: Not Progressing   Problem: RH KNOWLEDGE DEFICIT SCI Goal: RH STG INCREASE KNOWLEDGE OF SELF CARE AFTER SCI Description Mod assist  Outcome: Not Progressing    Still requiring max assist and cues for basic tasks.  Foley  catheter intact.  Incontinent of bowel.  Mental status fluctuates.  Daughter at bedside.
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
## 2018-01-21 ENCOUNTER — Inpatient Hospital Stay (HOSPITAL_COMMUNITY): Payer: Medicare Other | Admitting: Occupational Therapy

## 2018-01-21 MED ORDER — HYDROCORTISONE 0.5 % EX CREA
TOPICAL_CREAM | Freq: Four times a day (QID) | CUTANEOUS | Status: DC
  Filled 2018-01-21: qty 28.35

## 2018-01-21 MED ORDER — HYDROCORTISONE 1 % EX CREA
TOPICAL_CREAM | Freq: Four times a day (QID) | CUTANEOUS | Status: DC
  Administered 2018-01-21 – 2018-01-26 (×20): via TOPICAL
  Administered 2018-01-26: 1 via TOPICAL
  Administered 2018-01-26 – 2018-02-11 (×45): via TOPICAL
  Administered 2018-02-12: 1 via TOPICAL
  Administered 2018-02-12 – 2018-02-16 (×14): via TOPICAL
  Administered 2018-02-16: 1 via TOPICAL
  Administered 2018-02-18 – 2018-02-27 (×30): via TOPICAL
  Filled 2018-01-21 (×5): qty 28
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
## 2018-01-21 NOTE — Progress Notes (Signed)
At [facility] year "[facility]", "[place]", but immediately rambles about irrelevant information. PRN tylenol given at 2140. Held Lyrica R/T sedation on previous shift. Drank a cup of water, 1/2 carton of chocolate milk and 1/2 pudding. Rash all over ? Yeast? Awake until 0300. Alfredo MartinezMurray, Yanelie Abraha A

## 2018-01-21 NOTE — Progress Notes (Signed)
Occupational Therapy Session Note  Patient Details  Name: Brandon AngerMichael [name] MRN: [id number] Date of Birth: March 24, 1941  Today's Date: 01/21/2018 OT Individual Time: [id number] OT Individual Time Calculation (min): 43 min   Short Term Goals: Week 1:  OT Short Term Goal 1 (Week 1): Pt will initiate washing 1 body area during ADL session  OT Short Term Goal 2 (Week 1): Pt will complete UB dressing EOB with 1 helper to improve balance OT Short Term Goal 3 (Week 1): Pt will complete 1 grooming task with AE as needed and Mod A  Skilled Therapeutic Interventions/Progress Updates:    Pt greeted supine in bed with family present. RN also present to administer pain medication. Pt with facial grimacing and c/o back pain throughout session which limited participation. Worked on gentle AAROM R UE during self feeding using universal cuff. Pt able to activate proximal musculature against gravity to bring food to mouth. More alert this session compared to session yesterday, however still confused and saying nonsensical things. He also c/o neck pain and c-collar being too tight. When bed was placed in supine, and family keeping head in neutral, OT removed collar. Washed sweat from the front of neck and readjusted collar for comfort when reapplied. He reported this felt "better" however still exhibited symptoms of pain. RN notified for order of clean collar pads and of redness under chin where collar pads were rubbing. He was left with family and soft call bell against chest. 4 bedrails up.   Therapy Documentation Precautions:  Precautions Precautions: Fall, Cervical Required Braces or Orthoses: Cervical Brace Cervical Brace: Hard collar, Other (comment)(Don/doff in sitting. Ok to doff in bed, when ambulating to bathroom, and when showering per MD order) Restrictions Weight Bearing Restrictions: No  Pain: Noted above   ADL: ADL ADL Comments: Please see functional navigator for ADL status     See Function  Navigator for Current Functional Status.   Therapy/Group: Individual Therapy  Brandon [name] 01/21/2018, 12:15 PM

## 2018-01-22 ENCOUNTER — Inpatient Hospital Stay (HOSPITAL_COMMUNITY): Payer: Medicare Other

## 2018-01-22 ENCOUNTER — Encounter (HOSPITAL_COMMUNITY): Payer: Self-pay | Admitting: Neurosurgery

## 2018-01-22 ENCOUNTER — Inpatient Hospital Stay (HOSPITAL_COMMUNITY): Payer: Medicare Other | Admitting: Speech Pathology

## 2018-01-22 ENCOUNTER — Inpatient Hospital Stay (HOSPITAL_COMMUNITY): Payer: Medicare Other | Admitting: Physical Therapy

## 2018-01-22 ENCOUNTER — Inpatient Hospital Stay (HOSPITAL_COMMUNITY): Payer: Self-pay | Admitting: Occupational Therapy

## 2018-01-22 LAB — COMPREHENSIVE METABOLIC PANEL
ALK PHOS: 53 U/L (ref 38–126)
ALT: 42 U/L (ref 0–44)
AST: 50 U/L — AB (ref 15–41)
Albumin: 2.3 g/dL — ABNORMAL LOW (ref 3.5–5.0)
Anion gap: 6 (ref 5–15)
BILIRUBIN TOTAL: 1.3 mg/dL — AB (ref 0.3–1.2)
BUN: 18 mg/dL (ref 8–23)
CHLORIDE: 102 mmol/L (ref 98–111)
CO2: 30 mmol/L (ref 22–32)
CREATININE: 0.69 mg/dL (ref 0.61–1.24)
Calcium: 7.7 mg/dL — ABNORMAL LOW (ref 8.9–10.3)
GFR calc Af Amer: >60 mL/min (ref >60)
Glucose, Bld: 117 mg/dL — ABNORMAL HIGH (ref 70–99)
Potassium: 3.8 mmol/L (ref 3.5–5.1)
Sodium: 138 mmol/L (ref 135–145)
Total Protein: 4.7 g/dL — ABNORMAL LOW (ref 6.5–8.1)

## 2018-01-22 LAB — CBC WITH DIFFERENTIAL/PLATELET
ABS IMMATURE GRANULOCYTES: 0.1 10*3/uL (ref 0.0–0.1)
Basophils Absolute: 0 10*3/uL (ref 0.0–0.1)
Basophils Relative: 0 %
Eosinophils Absolute: 0.5 10*3/uL (ref 0.0–0.7)
Eosinophils Relative: 6 %
HEMATOCRIT: 36.2 % — AB (ref 39.0–52.0)
HEMOGLOBIN: 12.4 g/dL — AB (ref 13.0–17.0)
Immature Granulocytes: 1 %
LYMPHS ABS: 0.6 10*3/uL — AB (ref 0.7–4.0)
LYMPHS PCT: 8 %
MCH: 32.5 pg (ref 26.0–34.0)
MCHC: 34.3 g/dL (ref 30.0–36.0)
MCV: 94.8 fL (ref 78.0–100.0)
MONOS PCT: 11 %
Monocytes Absolute: 0.9 10*3/uL (ref 0.1–1.0)
NEUTROS ABS: 6.2 10*3/uL (ref 1.7–7.7)
NEUTROS PCT: 74 %
PLATELETS: 200 10*3/uL (ref 150–400)
RBC: 3.82 MIL/uL — ABNORMAL LOW (ref 4.22–5.81)
RDW: 12.3 % (ref 11.5–15.5)
WBC: 8.3 10*3/uL (ref 4.0–10.5)

## 2018-01-22 LAB — PROTIME-INR
INR: 1.32
PROTHROMBIN TIME: 16.2 s — AB (ref 11.4–15.2)

## 2018-01-22 MED ORDER — WARFARIN SODIUM 2.5 MG PO TABS
2.5000 mg | ORAL_TABLET | Freq: Once | ORAL | Status: AC
  Administered 2018-01-22: 2.5 mg via ORAL
  Filled 2018-01-22: qty 1

## 2018-01-22 MED ORDER — ACETAMINOPHEN 325 MG PO TABS
650.0000 mg | ORAL_TABLET | Freq: Three times a day (TID) | ORAL | Status: DC
  Administered 2018-01-22 – 2018-02-27 (×143): 650 mg via ORAL
  Filled 2018-01-22 (×144): qty 2

## 2018-01-22 NOTE — Progress Notes (Signed)
[facility] Assisted with I.S., able to get up to 1250. Oral care provided. Poor intake. Drank 120cc's of chocolate ensure and ate few bites of applesauce. [facility]. Bilateral PRAFO boots in place. Turned every 3 hours at HS. Foley in place. Doesn't call for assistance, needs anticipated by staff. Rash all over body. Patient denies that itching. Alfredo MartinezMurray, Lailoni Baquera A

## 2018-01-22 NOTE — Progress Notes (Signed)
Speech Language Pathology Daily Session Note  Patient Details  Name: Brandon [name] MRN: [id number] Date of Birth: Mar 19, 1941  Today's Date: 01/22/2018 SLP Individual Time: 1130-1200 SLP Individual Time Calculation (min): 30 min  Short Term Goals: Week 1: SLP Short Term Goal 1 (Week 1): Pt will consume therapeutic trials of Dysphagia III diet textures with timely oral phase + adequate oral clearance and without s/sx of aspiration across 3 consecutive sessions. SLP Short Term Goal 2 (Week 1): Pt will sustain attention to therapeutic tasks and/or conversation for ~15 minutes with less than 2 cues for redirection given Mod A cues. SLP Short Term Goal 3 (Week 1): Pt will complete functional, basic problem-solving tasks with Mod A cues.  SLP Short Term Goal 4 (Week 1): Pt will utilize external aids to recall functional information and daily events given Max A cues. SLP Short Term Goal 5 (Week 1): Pt will utilize speech intelligibility strategies to improve speech clarity/intelligibility to ~90% at the conversational level given Mod A cues.  Skilled Therapeutic Interventions: Skilled treatment session focused on speech and dysphagia goals. SLP facilitated session by providing supervision verbal cues for use of speech intelligibility exercises to maximize intelligibility to ~90% at the phrase level. Patient's voice has a phlegmy quality to it and patient educated on the importance of drinking water to help thin and clear mucous. Patient consumed thin liquids via straw without overt s/s of aspiration and medications whole in puree to maximize ease with swallowing (per his report difficulty with medications and thin liquids). However, patient chewing tablets, therefore, medications while continue to be administered with thins but with one at a time. Patient left upright in wheelchair with call bell within reach. Continue with current plan of care.      Function:   Cognition Comprehension Comprehension  assist level: Understands basic 50 - 74% of the time/ requires cueing 25 - 49% of the time  Expression   Expression assist level: Expresses basic 50 - 74% of the time/requires cueing 25 - 49% of the time. Needs to repeat parts of sentences.  Social Interaction Social Interaction assist level: Interacts appropriately 50 - 74% of the time - May be physically or verbally inappropriate.  Problem Solving Problem solving assist level: Solves basic 50 - 74% of the time/requires cueing 25 - 49% of the time;Solves basic 75 - 89% of the time/requires cueing 10 - 24% of the time  Memory Memory assist level: Recognizes or recalls 50 - 74% of the time/requires cueing 25 - 49% of the time    Pain Pain Assessment Faces Pain Scale: Hurts whole lot Pain Type: Acute pain Pain Location: Neck Pain Descriptors / Indicators: Aching Pain Onset: With Activity Pain Intervention(s): Repositioned  Therapy/Group: Individual Therapy  Yashar Inclan 01/22/2018, 3:37 PM
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
## 2018-01-22 NOTE — Progress Notes (Signed)
Physical Therapy Session Note  Patient Details  Name: Brandon [name] MRN: [id number] Date of Birth: 04-Jan-1941  Today's Date: 01/22/2018 PT Individual Time: 8115-7262 PT Individual Time Calculation (min): 90 min   Short Term Goals: Week 1:  PT Short Term Goal 1 (Week 1): Pt will perform bed mobility max assist +1 PT Short Term Goal 2 (Week 1): Pt will tolerate sitting in w/c for 60 min in between therapy sessions w/o increase in fatigue  PT Short Term Goal 3 (Week 1): Pt will maintain static sitting balance w/ max assist PT Short Term Goal 4 (Week 1): Pt will initiate standing w/ LRAD to work on standing tolerance   Skilled Therapeutic Interventions/Progress Updates:    Pt reports 10/10 neck pain prior to session start and nursing notified and pain levels monitored throughout session duration. BP assessed resting sitting in w/c= 140/88 asymptomatic. Pt transferred from w/c> tilt table using hoyer for heel cord stretch, weightbearing for NMR, and tolerance to an upright position. Supine on tilt table BP assessed= 159/110 asymptomatic. PT inclined tilt to 20 deg and BP assessed= 176/157 with nursing notified and Pt asymptomatic. Nursing reassessed BP manually to be 128/82 on R arm & 136/86 on L arm indicating automatic BP may be incorrect. Pt inclined further to 40 degrees and nursing reassesses BP manually in L arm= 130/78. Pt attempts to perform quad sets but unable to elicit contraction actively. Pt able to bend knees when cued, inconsistently, until fatigue but unable to extend in WB position. Pt able to tolerate tilt table incline for 30 min duration. Pt transferred from tilt table> chair via hoyer. PT facilitates lateral scoot transfer with slide board from w/c> bed with +2 assist. Pt able to assist EOB> supine with max A +1. Therapist positioned Pt in supine with pillows under B UE's for support and distal calves to promote heel floating to reduce skin break down potential. Pt left in supine with  soft call bell in reach and all needs met.     Therapy Documentation Precautions:  Precautions Precautions: Fall, Cervical Required Braces or Orthoses: Cervical Brace Cervical Brace: Hard collar, Other (comment)(Don/doff in sitting. Ok to doff in bed, when ambulating to bathroom, and when showering per MD order) Restrictions Weight Bearing Restrictions: No   See Function Navigator for Current Functional Status.   Therapy/Group: Individual Therapy  Floreen Comber 01/22/2018, 4:36 PM

## 2018-01-22 NOTE — Progress Notes (Signed)
Speech Language Pathology Daily Session Note  Patient Details  Name: Brandon [name] MRN: [id number] Date of Birth: 1940/08/27  Today's Date: 01/22/2018 SLP Individual Time: 1300-1400 SLP Individual Time Calculation (min): 60 min  Short Term Goals: Week 1: SLP Short Term Goal 1 (Week 1): Pt will consume therapeutic trials of Dysphagia III diet textures with timely oral phase + adequate oral clearance and without s/sx of aspiration across 3 consecutive sessions. SLP Short Term Goal 2 (Week 1): Pt will sustain attention to therapeutic tasks and/or conversation for ~15 minutes with less than 2 cues for redirection given Mod A cues. SLP Short Term Goal 3 (Week 1): Pt will complete functional, basic problem-solving tasks with Mod A cues.  SLP Short Term Goal 4 (Week 1): Pt will utilize external aids to recall functional information and daily events given Max A cues. SLP Short Term Goal 5 (Week 1): Pt will utilize speech intelligibility strategies to improve speech clarity/intelligibility to ~90% at the conversational level given Mod A cues.  Skilled Therapeutic Interventions:Skilled ST services focused on swallow and cognitive skills. Pt requested to get in bed upon entering room , SLP explained the purpose of present therapy pt agreed to participate. SLP positioned pt in tilt WC for Po consumption of lunch tray, dys 2 and thin, with minimal consumption and appropriate oral clearance. SLP facilitated trial of dys 3, pt continued to required total A feeding, with min A verbal cues to use lingual sweeps for moderate left buccal pocketing, no s/s aspiration noted. Pt leaned to left throughout session with little awareness, requiring repositioning. SLP facilitated basic problem solving skills utilizing picture cards to identify three difference, pt required min A verbal cues for problem solving and mod A verbal cues for recall. Pt required min A verbal cues for sustained attention in 10 minute intervals. Pt's  fiance entered room, and stated cognition has improved since the weekend, although demonstrated language of confusion at end of session.. Pt was left in room with call bell within reach and chair alarm set. Recommend to continue skilled ST services.      Function:  Eating Eating   Modified Consistency Diet: Yes(dys 3 trial ) Eating Assist Level: Helper feeds patient;More than reasonable amount of time           Cognition Comprehension Comprehension assist level: Understands basic 50 - 74% of the time/ requires cueing 25 - 49% of the time  Expression   Expression assist level: Expresses basic 50 - 74% of the time/requires cueing 25 - 49% of the time. Needs to repeat parts of sentences.  Social Interaction Social Interaction assist level: Interacts appropriately 50 - 74% of the time - May be physically or verbally inappropriate.  Problem Solving Problem solving assist level: Solves basic 50 - 74% of the time/requires cueing 25 - 49% of the time;Solves basic 75 - 89% of the time/requires cueing 10 - 24% of the time  Memory Memory assist level: Recognizes or recalls 50 - 74% of the time/requires cueing 25 - 49% of the time    Pain Pain Assessment Faces Pain Scale: Hurts whole lot Pain Type: Acute pain Pain Location: Neck Pain Descriptors / Indicators: Aching Pain Onset: With Activity Pain Intervention(s): Repositioned  Therapy/Group: Individual Therapy  Jessejames Steelman  [facility] 01/22/2018, 3:23 PM

## 2018-01-22 NOTE — Progress Notes (Signed)
Occupational Therapy Session Note  Patient Details  Name: Brandon [name] MRN: [id number] Date of Birth: 09/02/1940  Today's Date: 01/22/2018 OT Individual Time: 1030-1130 OT Individual Time Calculation (min): 60 min    Short Term Goals: Week 1:  OT Short Term Goal 1 (Week 1): Pt will initiate washing 1 body area during ADL session  OT Short Term Goal 2 (Week 1): Pt will complete UB dressing EOB with 1 helper to improve balance OT Short Term Goal 3 (Week 1): Pt will complete 1 grooming task with AE as needed and Mod A  Skilled Therapeutic Interventions/Progress Updates:    Pt received in bed with his daughter present at the start of the session. Pt was lethargic and having difficulty verbally expressing himself.  His daughter reported that he has had great difficulty with his memory.  Pt c/o neck pain, worked on repositioning with less padding at base of head. Clean neck pads applied as his pads had blood on them.  Pt was able to lift arms and flex elbows with limited elbow extension control,  Trace R hand flexion. Pt worked on rolling in bed with total A of 2 to receive a sponge bath. Shirt and pants donned for pt along with knee high TEDs. Pt rolled onto R side and then +2 total A to sit EOB which pt screamed out in pain in his neck. Once in sitting pt's pain subsided.  Total A of 1 to support sitting balance.   Slide board to w/c with total A of 2.  Pt positioned in tilt in space w/c in reclined positioned.  Positioned pillows to support arms and rolled blanket behind his head.  Pt's speech therapist arrived for his next session.   Therapy Documentation Precautions:  Precautions Precautions: Fall, Cervical Required Braces or Orthoses: Cervical Brace Cervical Brace: Hard collar, Other (comment)(Don/doff in sitting. Ok to doff in bed, when ambulating to bathroom, and when showering per MD order) Restrictions Weight Bearing Restrictions: No   Pain: Pain Assessment Faces Pain Scale: Hurts  whole lot Pain Type: Acute pain Pain Location: Neck Pain Descriptors / Indicators: Aching Pain Onset: With Activity ADL: ADL ADL Comments: Please see functional navigator for ADL status    See Function Navigator for Current Functional Status.   Therapy/Group: Individual Therapy  SAGUIER,JULIA 01/22/2018, 12:27 PM

## 2018-01-22 NOTE — Plan of Care (Signed)
  Problem: Consults Goal: Skin Care Protocol Initiated - if Braden Score 18 or less Description If consults are not indicated, leave blank or document N/A Outcome: Progressing   Problem: SCI BOWEL ELIMINATION Goal: RH STG MANAGE BOWEL WITH ASSISTANCE Description STG Manage Bowel with mod Assistance.  Outcome: Progressing Goal: RH STG SCI MANAGE BOWEL PROGRAM W/ASSIST OR AS APPROPRIATE Description STG SCI Manage bowel program w/mod assist or as appropriate.  Outcome: Progressing   Problem: RH SKIN INTEGRITY Goal: RH STG SKIN FREE OF INFECTION/BREAKDOWN Description Patients skin will remain free from further infection or breakdown with mod assist.  Outcome: Progressing Goal: RH STG MAINTAIN SKIN INTEGRITY WITH ASSISTANCE Description STG Maintain Skin Integrity With mod Assistance.  Outcome: Progressing Goal: RH STG ABLE TO PERFORM INCISION/WOUND CARE W/ASSISTANCE Description STG Able To Perform Incision/Wound Care With total Assistance from caregiver .  Outcome: Progressing   Problem: RH SAFETY Goal: RH STG ADHERE TO SAFETY PRECAUTIONS W/ASSISTANCE/DEVICE Description STG Adhere to Safety Precautions With mod Assistance/Device.  Outcome: Progressing   Problem: RH PAIN MANAGEMENT Goal: RH STG PAIN MANAGED AT OR BELOW PT'S PAIN GOAL Description < 4  Outcome: Progressing   Problem: RH KNOWLEDGE DEFICIT SCI Goal: RH STG INCREASE KNOWLEDGE OF SELF CARE AFTER SCI Description Mod assist  Outcome: Progressing   Problem: SCI BLADDER ELIMINATION Goal: RH STG MANAGE BLADDER WITH ASSISTANCE Description STG Manage Bladder With mod Assistance  Outcome: Not Progressing Goal: RH STG MANAGE BLADDER WITH MEDICATION WITH ASSISTANCE Description STG Manage Bladder With Medication With mod Assistance.  Outcome: Not Progressing Goal: RH STG MANAGE BLADDER WITH EQUIPMENT WITH ASSISTANCE Description STG Manage Bladder With Equipment With mod Assistance  Outcome: Not  Progressing Goal: RH STG SCI MANAGE BLADDER PROGRAM W/ASSISTANCE Description Mod assist  Outcome: Not Progressing    Patient more alert today, therefore some teaching started with patient.  However, unsure if patient is able to retain information.  Daughter has been educated some, when present.  Dani Gobbleeardon, Shakil Dirk J, RN

## 2018-01-22 NOTE — IPOC Note (Signed)
Overall Plan of Care [facility](IPOC) Patient Details Name: Brandon AngerMichael [name] MRN: [id number] DOB: 07-Jun-1940  Admitting Diagnosis: <principal problem not specified>  Hospital Problems: Active Problems:   Quadriparesis [facility](HCC)     Functional Problem List: Nursing Bladder, Bowel, Endurance, Medication Management, Motor, Skin Integrity, Sensory, Safety, Perception, Pain, Nutrition  PT Balance, Endurance, Motor, Pain, Safety, Sensory  OT Vision, Balance, Behavior, Cognition, Edema, Endurance, Motor, Nutrition, Pain, Perception, Safety, Sensory, Skin Integrity  SLP Cognition  TR         Basic ADL's: OT Eating, Grooming, Bathing, Dressing, Toileting     Advanced  ADL's: OT Simple Meal Preparation     Transfers: PT Bed Mobility, Furniture, Car, Bed to Chair, Civil Service fast [place]  OT Toilet, Tub/Shower     Locomotion: PT Ambulation, Psychologist, prison and probation servicesWheelchair Mobility, Stairs     Additional Impairments: OT Fuctional Use of Upper Extremity(bilateral UEs)  SLP Swallowing      TR      Anticipated Outcomes Item Anticipated Outcome  Self Feeding Min A  Swallowing  Mod A   Basic self-care  Min-Mod A  Toileting  Mod A   Bathroom Transfers Mod A  Bowel/Bladder  Mod assist  Transfers  mod assist   Locomotion  supervision w/ power mobility   Communication  N/A  Cognition  Min A  Pain  < 4  Safety/Judgment  Mod assist   Therapy Plan: PT Intensity: Minimum of 1-2 x/day ,45 to 90 minutes PT Frequency: 5 out of 7 days PT Duration Estimated Length of Stay: 4 weeks  OT Intensity: Minimum of 1-2 x/day, 45 to 90 minutes OT Frequency: 5 out of 7 days OT Duration/Estimated Length of Stay: 4 weeks SLP Intensity: Minumum of 1-2 x/day, 30 to 90 minutes SLP Frequency: 3 to 5 out of 7 days SLP Duration/Estimated Length of Stay: 4 weeks    Team Interventions: Nursing Interventions Patient/Family Education, Bladder Management, Bowel Management, Medication Management, Dysphagia/Aspiration Precaution Training, Pain  Management, Cognitive Remediation/Compensation, Psychosocial Support, Discharge Planning, Skin Care/Wound Management, Disease Management/Prevention  PT interventions Ambulation/gait training, Disease management/prevention, Pain management, Stair training, Visual/perceptual remediation/compensation, Wheelchair propulsion/positioning, Therapeutic Activities, Patient/family education, DME/adaptive equipment instruction, Warden/rangerBalance/vestibular training, Cognitive remediation/compensation, Functional electrical stimulation, Psychosocial support, Therapeutic Exercise, UE/LE Strength taining/ROM, Skin care/wound management, Functional mobility training, Community reintegration, Discharge planning, Neuromuscular re-education, Splinting/orthotics, UE/LE Coordination activities  OT Interventions Warden/rangerBalance/vestibular training, Fish farm managerDME/adaptive equipment instruction, Patient/family education, Therapeutic Activities, Wheelchair propulsion/positioning, Therapeutic Exercise, Psychosocial support, Functional electrical stimulation, Cognitive remediation/compensation, Community reintegration, Functional mobility training, Self Care/advanced ADL retraining, UE/LE Strength taining/ROM, UE/LE Coordination activities, Skin care/wound managment, Neuromuscular re-education, Discharge planning, Disease mangement/prevention, Pain management, Splinting/orthotics, Visual/perceptual remediation/compensation  SLP Interventions Cognitive remediation/compensation, Cueing hierarchy, Internal/external aids, Dysphagia/aspiration precaution training, Patient/family education, Functional tasks  TR Interventions    SW/CM Interventions Discharge Planning, Psychosocial Support, Patient/Family Education   Barriers to Discharge MD  Medical stability  Nursing Neurogenic Bowel & Bladder, Incontinence, Behavior Lives alone; fiance lives separately for now (been engaged for 8 wks?)   PT Decreased caregiver support, Incontinence Fiance able to provide  supervision level assist, daughter (who works during day) able to provide min assist  OT Medical stability, Home environment access/layout, Incontinence, Neurogenic Bowel & Bladder    SLP      SW       Team Discharge Planning: Destination: PT-Home ,OT-   , SLP-Home Projected Follow-up: PT-Home health PT, OT-  Home health OT, SLP-Home Health SLP, Outpatient SLP Projected Equipment Needs: PT-To be determined, OT- To be determined, SLP-To be determined Equipment  Details: PT- , OT-  Patient/family involved in discharge planning: PT- Patient, Family member/caregiver,  OT-Patient, SLP-Patient, Family member/caregiver  MD ELOS: 28 days Medical Rehab Prognosis:  Excellent Assessment: The patient has been admitted for CIR therapies with the diagnosis of cervical SCI, ?TBi. The team will be addressing functional mobility, strength, stamina, balance, safety, adaptive techniques and equipment, self-care, bowel and bladder mgt, patient and caregiver education, NMR, post-op precautions, orthotics, cognition, communication, community reentry. Goals have been set at mod assist for mobility and self-care, supervision for cognition.    Ranelle OysterZachary T. Shonette Rhames, MD, FAAPMR      See Team Conference Notes for weekly updates to the plan of care
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
## 2018-01-23 ENCOUNTER — Inpatient Hospital Stay (HOSPITAL_COMMUNITY): Payer: Medicare Other

## 2018-01-23 ENCOUNTER — Inpatient Hospital Stay (HOSPITAL_COMMUNITY): Payer: Medicare Other | Admitting: Occupational Therapy

## 2018-01-23 ENCOUNTER — Inpatient Hospital Stay (HOSPITAL_COMMUNITY): Payer: Medicare Other | Admitting: Speech Pathology

## 2018-01-23 ENCOUNTER — Inpatient Hospital Stay (HOSPITAL_COMMUNITY): Payer: Medicare Other | Admitting: Physical Therapy

## 2018-01-23 ENCOUNTER — Ambulatory Visit (HOSPITAL_COMMUNITY): Payer: Medicare Other

## 2018-01-23 LAB — PROTIME-INR
INR: 1.35
Prothrombin Time: 16.6 seconds — ABNORMAL HIGH (ref 11.4–15.2)

## 2018-01-23 MED ORDER — WARFARIN SODIUM 2.5 MG PO TABS
2.5000 mg | ORAL_TABLET | Freq: Once | ORAL | Status: AC
  Administered 2018-01-23: 2.5 mg via ORAL
  Filled 2018-01-23: qty 1

## 2018-01-23 NOTE — Progress Notes (Signed)
Occupational Therapy Session Note  Patient Details  Name: Brandon [name] MRN: [id number] Date of Birth: December 08, 1940  Today's Date: 01/23/2018 OT Individual Time: 0802-2336 OT Individual Time Calculation (min): 60 min    Short Term Goals: Week 1:  OT Short Term Goal 1 (Week 1): Pt will initiate washing 1 body area during ADL session  OT Short Term Goal 2 (Week 1): Pt will complete UB dressing EOB with 1 helper to improve balance OT Short Term Goal 3 (Week 1): Pt will complete 1 grooming task with AE as needed and Mod A  Skilled Therapeutic Interventions/Progress Updates:    Pt received in TIS w/c in tilted position at 45 degrees. At rest pt does not have shoulder/neck pain when he is reclined back.  Pt seen this session to focus on AROM of shoulders to facilitate motor movement.  PROM to B scapula with mobilization and glides which pt tolerated well in reclined angle.  He has restricted scapular retraction.  A/AROM to B shoulders with gentle pushing and pulling within a smaller ROM that did not induce pain for pt.  A/AROM to B elbow flex and extension and R wrist extension and R grasp.   Worked on hand over hand guidance of bringing spoon to his mouth with wrist support splint.  Attempted to have pt sit in a more upright position to brush teeth at sink but it was too painful for him.  Obtained a full lap tray to provide support to BUE to reduce weight of arms pulling on his neck.  With the lap tray, pt was able to tolerate sitting up at a 70 degree angle.   Pt's speech was more intelligible today and he was able to focus on conversation better today.  Pt resting in w/c with all needs met.   Therapy Documentation Precautions:  Precautions Precautions: Fall, Cervical Required Braces or Orthoses: Cervical Brace Cervical Brace: Hard collar, Other (comment)(Don/doff in sitting. Ok to doff in bed, when ambulating to bathroom, and when showering per MD order) Restrictions Weight Bearing  Restrictions: No  Pain: Faces Pain Scale: Hurts whole lot Pain Location: Neck Pain Orientation: Mid;Lower Pain Radiating Towards: (shoulders) Pain Descriptors / Indicators: Aching Patients Stated Pain Goal: 6 Pain Intervention(s): Medication (See eMAR);Repositioned ADL: ADL ADL Comments: Please see functional navigator for ADL status  See Function Navigator for Current Functional Status.   Therapy/Group: Individual Therapy  [place] 01/23/2018, 12:32 PM

## 2018-01-23 NOTE — Progress Notes (Signed)
Speech Language Pathology Daily Session Note  Patient Details  Name: Brandon [name] MRN: [id number] Date of Birth: 07/14/1940  Today's Date: 01/23/2018 SLP Individual Time: 1100-1150 SLP Individual Time Calculation (min): 50 min  Short Term Goals: Week 1: SLP Short Term Goal 1 (Week 1): Pt will consume therapeutic trials of Dysphagia III diet textures with timely oral phase + adequate oral clearance and without s/sx of aspiration across 3 consecutive sessions. SLP Short Term Goal 2 (Week 1): Pt will sustain attention to therapeutic tasks and/or conversation for ~15 minutes with less than 2 cues for redirection given Mod A cues. SLP Short Term Goal 3 (Week 1): Pt will complete functional, basic problem-solving tasks with Mod A cues.  SLP Short Term Goal 4 (Week 1): Pt will utilize external aids to recall functional information and daily events given Max A cues. SLP Short Term Goal 5 (Week 1): Pt will utilize speech intelligibility strategies to improve speech clarity/intelligibility to ~90% at the conversational level given Mod A cues.  Skilled Therapeutic Interventions: Skilled ST services focused on swallow and cognitive skills. Pt was asleep upon entering room and required max A verbal and tactile cues to gain arousal. Pt required max A verbal cues for sustained attention in 1 minute intervals. SLP communicated with PT, confirming lethargy following repeat theraputic sessions. SLP ordered early lunch tray for current to facilitate functional problem solving skills, due to increase lethargy, with trials of advance solids currently unsafe. SLP facilitated sustained attention and basic problem solving skills utilizing safe/unsafe picture cards, pt required max A verbal cues to identify unsafe pictures (field of 2), likely due to attention. Pt demonstrated language of confusion, in and out of periods of being sleep, with frustrated behavior when corrected about current situation. Pt required max A verbal  cues for orientation of place and day of week, slp posted visual aids in room, required mod A verbal cues it utilize. SLP facilitated PO consumption of dys 2 and thin liquid tray, pt required total assistance, consuming 10 % wit no overt s/s aspiration. Pt requested to get into bed, SLp notified nursing staff. Pt was left in room with [facility] staff [facility] Pt missed 10 minutes of treatment due to lethargy. ST reccomends to continue skilled ST services.     Function:  Eating Eating   Modified Consistency Diet: Yes Eating Assist Level: Helper feeds patient;Supervision or verbal cues           Cognition Comprehension Comprehension assist level: Understands basic 25 - 49% of the time/ requires cueing 50 - 75% of the time  Expression   Expression assist level: Expresses basic 50 - 74% of the time/requires cueing 25 - 49% of the time. Needs to repeat parts of sentences.  Social Interaction Social Interaction assist level: Interacts appropriately 25 - 49% of time - Needs frequent redirection.  Problem Solving Problem solving assist level: Solves basic 25 - 49% of the time - needs direction more than half the time to initiate, plan or complete simple activities  Memory Memory assist level: Recognizes or recalls 25 - 49% of the time/requires cueing 50 - 75% of the time    Pain Pain Assessment Pain Score: 0-No pain Faces Pain Scale: Hurts whole lot Pain Intervention(s): Repositioned  Therapy/Group: Individual Therapy  [name]  [facility] 01/23/2018, 11:54 AM

## 2018-01-23 NOTE — Discharge Instructions (Signed)
Inpatient Rehab Discharge Instructions  Brandon AngerMichael [name] Discharge date and time: No discharge date for patient encounter.   Activities/Precautions/ Functional Status: Activity: Cervical collar as directed Diet: Mechanical soft Wound Care: keep wound clean and dry Functional status:  ___ No restrictions     ___ Walk up steps independently ___ 24/7 supervision/assistance   ___ Walk up steps with assistance ___ Intermittent supervision/assistance  ___ Bathe/dress independently ___ Walk with walker     _x__ Bathe/dress with assistance ___ Walk Independently    ___ Shower independently ___ Walk with assistance    ___ Shower with assistance ___ No alcohol     ___ Return to work/school ________  Special Instructions:    My questions have been answered and I understand these instructions. I will adhere to these goals and the provided educational materials after my discharge from the hospital.  Patient/Caregiver Signature _______________________________ Date __________  Clinician Signature _______________________________________ Date __________  Please bring this form and your medication list with you to all your follow-up doctor's appointments.    Information on my medicine - Coumadin   (Warfarin)  Why was Coumadin prescribed for you? Coumadin was prescribed for you because you have a blood clot or a medical condition that can cause an increased risk of forming blood clots. Blood clots can cause serious health problems by blocking the flow of blood to the heart, lung, or brain. Coumadin can prevent harmful blood clots from forming. As a reminder your indication for Coumadin is:   Select from menu  What test will check on my response to Coumadin? While on Coumadin (warfarin) you will need to have an INR test regularly to ensure that your dose is keeping you in the desired range. The INR (international normalized ratio) number is calculated from the result of the laboratory test called  prothrombin time (PT).  If an INR APPOINTMENT HAS NOT ALREADY BEEN MADE FOR YOU please schedule an appointment to have this lab work done by your health care provider within 7 days. Your INR goal is usually a number between:  2 to 3 or your provider may give you a more narrow range like 2-2.5.  Ask your health care provider during an office visit what your goal INR is.  What  do you need to  know  About  COUMADIN? Take Coumadin (warfarin) exactly as prescribed by your healthcare provider about the same time each day.  DO NOT stop taking without talking to the doctor who prescribed the medication.  Stopping without other blood clot prevention medication to take the place of Coumadin may increase your risk of developing a new clot or stroke.  Get refills before you run out.  What do you do if you miss a dose? If you miss a dose, take it as soon as you remember on the same day then continue your regularly scheduled regimen the next day.  Do not take two doses of Coumadin at the same time.  Important Safety Information A possible side effect of Coumadin (Warfarin) is an increased risk of bleeding. You should call your healthcare provider right away if you experience any of the following: ? Bleeding from an injury or your nose that does not stop. ? Unusual colored urine (red or dark brown) or unusual colored stools (red or black). ? Unusual bruising for unknown reasons. ? A serious fall or if you hit your head (even if there is no bleeding).  Some foods or medicines interact with Coumadin (warfarin) and might alter your  response to warfarin. To help avoid this: ? Eat a balanced diet, maintaining a consistent amount of Vitamin K. ? Notify your provider about major diet changes you plan to make. ? Avoid alcohol or limit your intake to 1 drink for women and 2 drinks for men per day. (1 drink is 5 oz. wine, 12 oz. beer, or 1.5 oz. liquor.)  Make sure that ANY health care provider who prescribes  medication for you knows that you are taking Coumadin (warfarin).  Also make sure the healthcare provider who is monitoring your Coumadin knows when you have started a new medication including herbals and non-prescription products.  Coumadin (Warfarin)  Major Drug Interactions  Increased Warfarin Effect Decreased Warfarin Effect  Alcohol (large quantities) Antibiotics (esp. Septra/Bactrim, Flagyl, Cipro) Amiodarone (Cordarone) Aspirin (ASA) Cimetidine (Tagamet) Megestrol (Megace) NSAIDs (ibuprofen, naproxen, etc.) Piroxicam (Feldene) Propafenone (Rythmol SR) Propranolol (Inderal) Isoniazid (INH) Posaconazole (Noxafil) Barbiturates (Phenobarbital) Carbamazepine (Tegretol) Chlordiazepoxide (Librium) Cholestyramine (Questran) Griseofulvin Oral Contraceptives Rifampin Sucralfate (Carafate) Vitamin K   Coumadin (Warfarin) Major Herbal Interactions  Increased Warfarin Effect Decreased Warfarin Effect  Garlic Ginseng Ginkgo biloba Coenzyme Q10 Green tea St. Johns wort    Coumadin (Warfarin) FOOD Interactions  Eat a consistent number of servings per week of foods HIGH in Vitamin K (1 serving =  cup)  Collards (cooked, or boiled & drained) Kale (cooked, or boiled & drained) Mustard greens (cooked, or boiled & drained) Parsley *serving size only =  cup Spinach (cooked, or boiled & drained) Swiss chard (cooked, or boiled & drained) Turnip greens (cooked, or boiled & drained)  Eat a consistent number of servings per week of foods MEDIUM-HIGH in Vitamin K (1 serving = 1 cup)  Asparagus (cooked, or boiled & drained) Broccoli (cooked, boiled & drained, or raw & chopped) Brussel sprouts (cooked, or boiled & drained) *serving size only =  cup Lettuce, raw (green leaf, endive, romaine) Spinach, raw Turnip greens, raw & chopped   These websites have more information on Coumadin (warfarin):  http://www.king-russell.com/www.coumadin.com; https://www.hines.net/www.ahrq.gov/consumer/coumadin.htm;

## 2018-01-23 NOTE — Progress Notes (Signed)
Speech Language Pathology Daily Session Note  Patient Details  Name: Brandon [name] MRN: [id number] Date of Birth: [birth year]  Today's Date: 01/23/2018 SLP Individual Time: 1355-1425 SLP Individual Time Calculation (min): 30 min  Short Term Goals: Week 1: SLP Short Term Goal 1 (Week 1): Pt will consume therapeutic trials of Dysphagia III diet textures with timely oral phase + adequate oral clearance and without s/sx of aspiration across 3 consecutive sessions. SLP Short Term Goal 2 (Week 1): Pt will sustain attention to therapeutic tasks and/or conversation for ~15 minutes with less than 2 cues for redirection given Mod A cues. SLP Short Term Goal 3 (Week 1): Pt will complete functional, basic problem-solving tasks with Mod A cues.  SLP Short Term Goal 4 (Week 1): Pt will utilize external aids to recall functional information and daily events given Max A cues. SLP Short Term Goal 5 (Week 1): Pt will utilize speech intelligibility strategies to improve speech clarity/intelligibility to ~90% at the conversational level given Mod A cues.  Skilled Therapeutic Interventions: Skilled treatment session focused on cognitive goals. SLP facilitated session by providing supervision verbal cues problem solving during a verbal reasoning task in regards to specificity and also required supervision verbal cues for sustained attention to task for ~20 minutes. Patient was ~90% intelligible at the sentence level today and demonstrated increased breath support during verbal expression, suspect due to positioning. Patient required total A to recall events from previous therapy sessions. Patient left semi-reclined in bed with alarm on, family present and all needs within reach. Continue with current plan of care.   Function:   Cognition Comprehension Comprehension assist level: Understands basic 75 - 89% of the time/ requires cueing 10 - 24% of the time  Expression   Expression assist level: Expresses basic 75 -  89% of the time/requires cueing 10 - 24% of the time. Needs helper to occlude trach/needs to repeat words.  Social Interaction Social Interaction assist level: Interacts appropriately 50 - 74% of the time - May be physically or verbally inappropriate.  Problem Solving Problem solving assist level: Solves basic 25 - 49% of the time - needs direction more than half the time to initiate, plan or complete simple activities  Memory Memory assist level: Recognizes or recalls 25 - 49% of the time/requires cueing 50 - 75% of the time    Pain Pain Assessment Pain Scale: 0-10 Pain Score: 0-No pain Pain Location: Neck Pain Orientation: Mid;Lower Pain Radiating Towards: (shoulders) Pain Descriptors / Indicators: Aching Patients Stated Pain Goal: 6 Pain Intervention(s): Medication (See eMAR);Repositioned  Therapy/Group: Individual Therapy  Brandon [name] 01/23/2018, 3:03 PM

## 2018-01-23 NOTE — Progress Notes (Signed)
Physical Therapy Session Note  Patient Details  Name: Brandon [name] MRN: [id number] Date of Birth: [birth year]  Today's Date: 01/23/2018 PT Individual Time: 0800-0830 PT Individual Time Calculation (min): 30 min   Short Term Goals: Week 1:  PT Short Term Goal 1 (Week 1): Pt will perform bed mobility max assist +1 PT Short Term Goal 2 (Week 1): Pt will tolerate sitting in w/c for 60 min in between therapy sessions w/o increase in fatigue  PT Short Term Goal 3 (Week 1): Pt will maintain static sitting balance w/ max assist PT Short Term Goal 4 (Week 1): Pt will initiate standing w/ LRAD to work on standing tolerance   Skilled Therapeutic Interventions/Progress Updates:   Pt finishing breakfast in elevated supine, agreeable to therapy, denies neck pain at rest. Pt upset regarding his diet orders, states he has a right to eat what he wants despite education on risk of eating upgraded diet prior to speech signing off. Agreeable to get up to w/c w/ encouragement. Performed bed mobility total assist x2, total assist x1 to maintain static sitting while 2nd helper set up slide board transfer. Slide board transfer to tilt-in-space, total assist x2, pt unable to provide any assist 2/2 neck pain distraction, pain 8/10 on faces scale. Pain resolved w/ supported upright sitting in w/c. Max assist to reposition hips for neutral posture, pt able to initiate trace movement of hips and trunk. Repositioned headrest for better support for head in reclined sitting w/ decreased neck pain afterwards. Ended session tilted in TIS, call bell within reach and resting comfortably.   Therapy Documentation Precautions:  Precautions Precautions: Fall, Cervical Required Braces or Orthoses: Cervical Brace Cervical Brace: Hard collar, Other (comment)(Don/doff in sitting. Ok to doff in bed, when ambulating to bathroom, and when showering per MD order) Restrictions Weight Bearing Restrictions: No Vital Signs: Therapy  Vitals Temp: 98.5 F (36.9 C) Temp Source: Oral Pulse Rate: 74 Resp: 17 BP: (!) 163/96(nurse notified) Patient Position (if appropriate): Lying Oxygen Therapy SpO2: 97 % O2 Device: Room Air Pain: Pain Assessment Pain Scale: 0-10 Pain Score: 0-No pain  See Function Navigator for Current Functional Status.   Therapy/Group: Individual Therapy  Alys Dulak K Arnette 01/23/2018, 8:56 AM

## 2018-01-23 NOTE — Plan of Care (Signed)
  Problem: Consults Goal: RH SPINAL CORD INJURY PATIENT EDUCATION Description  See Patient Education module for education specifics.  Outcome: Not Progressing Goal: Skin Care Protocol Initiated - if Braden Score 18 or less Description If consults are not indicated, leave blank or document N/A Outcome: Not Progressing   Problem: SCI BOWEL ELIMINATION Goal: RH STG MANAGE BOWEL WITH ASSISTANCE Description STG Manage Bowel with mod Assistance.  Outcome: Not Progressing Goal: RH STG SCI MANAGE BOWEL PROGRAM W/ASSIST OR AS APPROPRIATE Description STG SCI Manage bowel program w/mod assist or as appropriate.  Outcome: Not Progressing   Problem: SCI BLADDER ELIMINATION Goal: RH STG MANAGE BLADDER WITH ASSISTANCE Description STG Manage Bladder With mod Assistance  Outcome: Not Progressing Goal: RH STG MANAGE BLADDER WITH MEDICATION WITH ASSISTANCE Description STG Manage Bladder With Medication With mod Assistance.  Outcome: Not Progressing Goal: RH STG MANAGE BLADDER WITH EQUIPMENT WITH ASSISTANCE Description STG Manage Bladder With Equipment With mod Assistance  Outcome: Not Progressing Goal: RH STG SCI MANAGE BLADDER PROGRAM W/ASSISTANCE Description Mod assist  Outcome: Not Progressing   Problem: RH SKIN INTEGRITY Goal: RH STG SKIN FREE OF INFECTION/BREAKDOWN Description Patients skin will remain free from further infection or breakdown with mod assist.  Outcome: Not Progressing Goal: RH STG MAINTAIN SKIN INTEGRITY WITH ASSISTANCE Description STG Maintain Skin Integrity With mod Assistance.  Outcome: Not Progressing Goal: RH STG ABLE TO PERFORM INCISION/WOUND CARE W/ASSISTANCE Description STG Able To Perform Incision/Wound Care With total Assistance from caregiver .  Outcome: Not Progressing   Problem: RH SAFETY Goal: RH STG ADHERE TO SAFETY PRECAUTIONS W/ASSISTANCE/DEVICE Description STG Adhere to Safety Precautions With mod Assistance/Device.  Outcome: Not  Progressing   Problem: RH PAIN MANAGEMENT Goal: RH STG PAIN MANAGED AT OR BELOW PT'S PAIN GOAL Description < 4  Outcome: Not Progressing   Problem: RH KNOWLEDGE DEFICIT SCI Goal: RH STG INCREASE KNOWLEDGE OF SELF CARE AFTER SCI Description Mod assist  Outcome: Not Progressing   Pt not progressing, remains dependent with total care.

## 2018-01-23 NOTE — Progress Notes (Signed)
Physical Therapy Session Note  Patient Details  Name: Brandon [name] MRN: [id number] Date of Birth: 03-Apr-1941  Today's Date: 01/23/2018 PT Individual Time: 1000-1054 PT Individual Time Calculation (min): 54 min   Short Term Goals: Week 1:  PT Short Term Goal 1 (Week 1): Pt will perform bed mobility max assist +1 PT Short Term Goal 2 (Week 1): Pt will tolerate sitting in w/c for 60 min in between therapy sessions w/o increase in fatigue  PT Short Term Goal 3 (Week 1): Pt will maintain static sitting balance w/ max assist PT Short Term Goal 4 (Week 1): Pt will initiate standing w/ LRAD to work on standing tolerance   Skilled Therapeutic Interventions/Progress Updates:    Pt reports 7/10 posterior neck pain radiating to upper trap region prior to session start and pain monitored throughout session duration. PT verbalizes and demonstrates proper sequencing, head hip relationship and technique to perform lateral scoot transfers with slide board with Pt verbalizing understanding. Pt performs slide board transfer with +2 assist from w/c> mat. Pt grimacing with pain s/p transfer reporting inc pain levels to 9/10 indicating it feels like his body is "on fire every time he is moved." Pt requires prolonged seated rest break s/p transfer leaning back onto therapist sitting on ball for sx relief. Pt attempts to perform static sitting edge of mat with B UE support with therapist providing max A posteriorly for trunk support. Pt able to hold position for 5 min duration and unable to bring head up to neutral position from cervical flexion 2/2 pain. Pt performs slide board transfer from mat> w/c with +2 assist and +2 assist to scoot posteriorly in chair for seated posture. PT tilts chair and returns Pt to room. Pt left in chair with pillows under B UE's for support and nursing present. Soft call bell in reach and all needs met.   Therapy Documentation Precautions:  Precautions Precautions: Fall,  Cervical Required Braces or Orthoses: Cervical Brace Cervical Brace: Hard collar, Other (comment)(Don/doff in sitting. Ok to doff in bed, when ambulating to bathroom, and when showering per MD order) Restrictions Weight Bearing Restrictions: No   See Function Navigator for Current Functional Status.   Therapy/Group: Individual Therapy  Floreen Comber 01/23/2018, 12:09 PM

## 2018-01-23 NOTE — Progress Notes (Signed)
Occupational Therapy Session Note  Patient Details  Name: Brandon [name] MRN: [id number] Date of Birth: 1940/07/05  Today's Date: 01/23/2018 OT Individual Time: 1300-1325 OT Individual Time Calculation (min): 25 min    Short Term Goals: Week 1:  OT Short Term Goal 1 (Week 1): Pt will initiate washing 1 body area during ADL session  OT Short Term Goal 2 (Week 1): Pt will complete UB dressing EOB with 1 helper to improve balance OT Short Term Goal 3 (Week 1): Pt will complete 1 grooming task with AE as needed and Mod A  Skilled Therapeutic Interventions/Progress Updates:    Pt resting in bed upon arrival.  OT intervention with focus on bed mobility, BUE PROM, Kinesio Tape application to neck, and activity tolerance.  Pt requires max A for rolling in bed. Kinesio tape applied to either side of posterior neck incision.  Benefits explained to pt.  Pt remained positioned on R side with pillows placed for positioning. Soft call bell placed for easy access and pt demonstrated use of call bell.   Therapy Documentation Precautions:  Precautions Precautions: Fall, Cervical Required Braces or Orthoses: Cervical Brace Cervical Brace: Hard collar, Other (comment)(Don/doff in sitting. Ok to doff in bed, when ambulating to bathroom, and when showering per MD order) Restrictions Weight Bearing Restrictions: No  Pain: Pt c/o posterior neck pain bilateral to incision; repositioned and Kinesio Tape applied See Function Navigator for Current Functional Status.   Therapy/Group: Individual Therapy  Rich BraveLanier, Shedrick Sarli Chappell 01/23/2018, 2:34 PM
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
## 2018-01-23 NOTE — Progress Notes (Signed)
Updated primary RN on team conference discussions.  Discussed possibility to remove foley catheter.  Dr. Riley KillSwartz hesitant due to patients mobility and cognition which play a role in his involvement in a bladder program.  Discussed catheter removal tomorrow morning to start voiding trial that nursing can provide incontinence care or intermittent caths for retention if needed.  Discussed pain regimen, that nursing is holding lyrica due to familys concern that medications may be impacting patients mental status.  Therapists note that pain limits the patient during therapy, despite scheduled tylenol.  Verified orders for use of aspen collar.  Daughter also has concerns about patients eye drainage and discomfort.  Dr. Riley KillSwartz acknowledged concern.  Dani Gobbleeardon, Samar Venneman J, RN

## 2018-01-24 ENCOUNTER — Inpatient Hospital Stay (HOSPITAL_COMMUNITY): Payer: Medicare Other | Admitting: Speech Pathology

## 2018-01-24 ENCOUNTER — Inpatient Hospital Stay (HOSPITAL_COMMUNITY): Payer: Medicare Other | Admitting: Physical Therapy

## 2018-01-24 ENCOUNTER — Inpatient Hospital Stay (HOSPITAL_COMMUNITY): Payer: Medicare Other | Admitting: Occupational Therapy

## 2018-01-24 LAB — URINALYSIS, ROUTINE W REFLEX MICROSCOPIC
Bilirubin Urine: NEGATIVE
Glucose, UA: NEGATIVE mg/dL
Hgb urine dipstick: NEGATIVE
Ketones, ur: NEGATIVE mg/dL
Leukocytes, UA: NEGATIVE
NITRITE: NEGATIVE
PROTEIN: NEGATIVE mg/dL
SPECIFIC GRAVITY, URINE: 1.012 (ref 1.005–1.030)
pH: 8 (ref 5.0–8.0)

## 2018-01-24 LAB — PROTIME-INR
INR: 1.39
Prothrombin Time: 17 seconds — ABNORMAL HIGH (ref 11.4–15.2)

## 2018-01-24 MED ORDER — WARFARIN SODIUM 5 MG PO TABS
5.0000 mg | ORAL_TABLET | Freq: Once | ORAL | Status: AC
  Administered 2018-01-24: 5 mg via ORAL
  Filled 2018-01-24 (×2): qty 1

## 2018-01-24 MED ORDER — PRO-STAT SUGAR FREE PO LIQD
30.0000 mL | Freq: Two times a day (BID) | ORAL | Status: DC
  Administered 2018-01-24 – 2018-02-27 (×65): 30 mL via ORAL
  Filled 2018-01-24 (×64): qty 30

## 2018-01-24 NOTE — Plan of Care (Signed)
  Problem: Consults Goal: RH SPINAL CORD INJURY PATIENT EDUCATION Description  See Patient Education module for education specifics.  Outcome: Progressing Goal: Skin Care Protocol Initiated - if Braden Score 18 or less Description If consults are not indicated, leave blank or document N/A Outcome: Progressing   Problem: RH SKIN INTEGRITY Goal: RH STG SKIN FREE OF INFECTION/BREAKDOWN Description Patients skin will remain free from further infection or breakdown with mod assist.  Outcome: Progressing Goal: RH STG MAINTAIN SKIN INTEGRITY WITH ASSISTANCE Description STG Maintain Skin Integrity With mod Assistance.  Outcome: Progressing Goal: RH STG ABLE TO PERFORM INCISION/WOUND CARE W/ASSISTANCE Description STG Able To Perform Incision/Wound Care With total Assistance from caregiver .  Outcome: Progressing   Problem: RH SAFETY Goal: RH STG ADHERE TO SAFETY PRECAUTIONS W/ASSISTANCE/DEVICE Description STG Adhere to Safety Precautions With mod Assistance/Device.  Outcome: Progressing   Problem: RH PAIN MANAGEMENT Goal: RH STG PAIN MANAGED AT OR BELOW PT'S PAIN GOAL Description < 4  Outcome: Progressing   Problem: RH KNOWLEDGE DEFICIT SCI Goal: RH STG INCREASE KNOWLEDGE OF SELF CARE AFTER SCI Description Mod assist  Outcome: Progressing   Problem: SCI BOWEL ELIMINATION Goal: RH STG MANAGE BOWEL WITH ASSISTANCE Description STG Manage Bowel with mod Assistance.  Outcome: Not Progressing Goal: RH STG SCI MANAGE BOWEL PROGRAM W/ASSIST OR AS APPROPRIATE Description STG SCI Manage bowel program w/mod assist or as appropriate.  Outcome: Not Progressing   Problem: SCI BLADDER ELIMINATION Goal: RH STG MANAGE BLADDER WITH ASSISTANCE Description STG Manage Bladder With mod Assistance  Outcome: Not Progressing Goal: RH STG MANAGE BLADDER WITH EQUIPMENT WITH ASSISTANCE Description STG Manage Bladder With Equipment With mod Assistance  Outcome: Not Progressing Goal: RH STG  SCI MANAGE BLADDER PROGRAM W/ASSISTANCE Description Mod assist  Outcome: Not Progressing   Foley removed today for voiding trial.  Requiring max/total assist for bowels (incontinent after suppository).  Brandon [name], Whisper Kurka J, RN
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
## 2018-01-24 NOTE — Progress Notes (Signed)
Physical Therapy Session Note  Patient Details  Name: Brandon [name] MRN: [id number] Date of Birth: 07/11/1940  Today's Date: 01/24/2018 PT Individual Time: 1300-1415 PT Individual Time Calculation (min): 75 min   Short Term Goals: Week 1:  PT Short Term Goal 1 (Week 1): Pt will perform bed mobility max assist +1 PT Short Term Goal 2 (Week 1): Pt will tolerate sitting in w/c for 60 min in between therapy sessions w/o increase in fatigue  PT Short Term Goal 3 (Week 1): Pt will maintain static sitting balance w/ max assist PT Short Term Goal 4 (Week 1): Pt will initiate standing w/ LRAD to work on standing tolerance   Skilled Therapeutic Interventions/Progress Updates:    Pt reports mild pain while positioned in supine prior to session start. Pt observably disoriented, confused, and hallucinating throughout session duration and session limited 2/2 to pain and confusion. PT dons ted's and socks total A and provides max A to don pants with Pt unable to perform hip bridging to assist. PT notes observable mm fibrillations with Pt attempt to perform bridging in hook lying but no observable contraction to assist donning pants. Pt performs rolling with therapist positioning LE's and UE's with verbal cues for sequencing. Therapist provides max A for supine> sitting edge of bed for trunk and LE management. Pt reports 10/10 pain in sitting with increased SOB and therapist initiated deep breathing with Pt indicating pain relief with increased rest break. Nursing entered to administer tylenol during session. Slide board transfer from bed>w/c total A plus 2 helpers. Slide board transfer from w/c>treatment mat +2 assist. PT notes grimacing with Pt reported increased pain with therapist allowing him to lean back onto ball in sitting position for semi recline to allow sx to subside with time. Cool towel applied to forehead with Pt indicating sx subsiding and music therapy for calming observable agitation. Pt performs  static sitting edge of treatment mat with intermittent min A for trunk fading to max A 2/2 fatigue and pain levels increasing for <1 min duration. Pt becomes agitated with increasing pain during staticsitting balance task requiring increased time and verbal cues to redirect. Slide board transfer from mat> w/c +2 assist with Pt indicating pain levels increasing with movement. Pt returned to room with soft call bell in place and all needs met.   Therapy Documentation Precautions:  Precautions Precautions: Fall, Cervical Required Braces or Orthoses: Cervical Brace Cervical Brace: Hard collar, Other (comment)(Don/doff in sitting. Ok to doff in bed, when ambulating to bathroom, and when showering per MD order) Restrictions Weight Bearing Restrictions: No   See Function Navigator for Current Functional Status.   Therapy/Group: Individual Therapy    01/24/2018, 4:17 PM  

## 2018-01-24 NOTE — Progress Notes (Signed)
Occupational Therapy Session Note  Patient Details  Name: Brandon [name] MRN: [id number] Date of Birth: Apr 04, 1941  Today's Date: 01/24/2018 OT Individual Time: [id number] OT Individual Time Calculation (min): 32 min  and Today's Date: 01/24/2018 OT Missed Time: 30 Minutes Missed Time Reason: Patient fatigue;Pain   Short Term Goals: Week 1:  OT Short Term Goal 1 (Week 1): Pt will initiate washing 1 body area during ADL session  OT Short Term Goal 2 (Week 1): Pt will complete UB dressing EOB with 1 helper to improve balance OT Short Term Goal 3 (Week 1): Pt will complete 1 grooming task with AE as needed and Mod A  Skilled Therapeutic Interventions/Progress Updates:    Upon entering the room, pt in wheelchair with girlfriend present in the room. Pt with 10/10 c/o pain in B shoulders and neck. RN arrived and gave pain medication this session. OT attempting to reposition pt for comfort in chair. Pt oriented to self only, unable to state name of girlfriend, not oriented to time ,location, or situation. Pt with very tangential speech and required max cues to redirect. Pt engaged in B UE A/AROM x 10 reps for elbow and wrist in all planes of movement. OT unable to assist with shoulder movements secondary to increased pain. Pt remaining in wheelchair at end of session with caregiver present in the room. Soft call bell attached to pt's clothing.  Therapy Documentation Precautions:  Precautions Precautions: Fall, Cervical Required Braces or Orthoses: Cervical Brace Cervical Brace: Hard collar, Other (comment)(Don/doff in sitting. Ok to doff in bed, when ambulating to bathroom, and when showering per MD order) Restrictions Weight Bearing Restrictions: No General: General OT Amount of Missed Time: 30 Minutes Vital Signs: Therapy Vitals Temp: 98 F (36.7 C) Temp Source: Oral Pulse Rate: 77 Resp: 14 BP: 111/77 Patient Position (if appropriate): Sitting Oxygen Therapy SpO2: 97 % O2 Device:  Room Air Pain:   ADL: ADL ADL Comments: Please see functional navigator for ADL status  See Function Navigator for Current Functional Status.   Therapy/Group: Individual Therapy  Alen BleacherBradsher, Walterine Amodei P 01/24/2018, 4:00 PM

## 2018-01-24 NOTE — Care Management (Signed)
[facility] Individual Statement of Services  Patient Name:  Brandon AngerMichael [name]  Date:  01/24/2018  Welcome to the [facility].  Our goal is to provide you with an individualized program based on your diagnosis and situation, designed to meet your specific needs.  With this comprehensive rehabilitation program, you will be expected to participate in at least 3 hours of rehabilitation therapies Monday-Friday, with modified therapy programming on the weekends.  Your rehabilitation program will include the following services:  Physical Therapy (PT), Occupational Therapy (OT), Speech Therapy (ST), 24 hour per day rehabilitation nursing, Therapeutic Recreaction (TR), Neuropsychology, Case Management (Social Worker), Rehabilitation Medicine, Nutrition Services and Pharmacy Services  Weekly team conferences will be held on Tuesdays to discuss your progress.  Your Social Worker will talk with you frequently to get your input and to update you on team discussions.  Team conferences with you and your family in attendance may also be held.  Expected length of stay: 4 weeks   Overall anticipated outcome: moderate assistance at wheelchair level  Depending on your progress and recovery, your program may change. Your Social Worker will coordinate services and will keep you informed of any changes. Your Social Worker's name and contact numbers are listed  below.  The following services may also be recommended but are not provided by the [facility]:   Driving Evaluations  Home Health Rehabiltiation Services  Outpatient Rehabilitation Services  Skilled Nursing Facility   Arrangements will be made to provide these services after discharge if needed.  Arrangements include referral to agencies that provide these services.  Your insurance has been verified to be:  Medicare and BCBS Your primary doctor is:  [facility]  Pertinent information will be shared  with your doctor and your insurance company.  Social Worker:  RupertLucy Jamecia Lerman, [place] [phone] or [phone]   Information discussed with and copy given to patient by: Amada JupiterHOYLE, Ore[place]wa Aigner, 01/23/2018, 1:29 PM

## 2018-01-24 NOTE — Progress Notes (Signed)
Pt has been sleeping a lot today. Increase confusion has been reported by a few staff members. Different staff members has reported the confusion is WNL. During current nurse assessment pt was able to answer most questions when eyes were open. Pt has very poor attention [place] and does add babble with each responding answer. Staying awake seems to be Mr. Brandon [name] issue today. Breathing and nonverbal response to pain seems to be within normal behavior while asleep.  Brandon [name] verbally c/o shoulder and right arm pain earlier this afternoon. Tylenol and Flexeril was given before therapy(15:17). Pt has been sleep in his w/c since returning from therapy. Slight jerk and/or frawn did occur twice while sleep when left arm was repositioned and squeezed.  Lunch and dinner was not consumed due to pt will not open eyes. Face was washed with cold wash cloth. Lips were moist. Eyes opened when light sternal rub was preformed. Eyes immediately closes back. No signs of distress has been observed. UA was collected this afternoon.

## 2018-01-24 NOTE — Progress Notes (Signed)
Removed foley catheter at 0930.  Patient tolerated well.  Patient also incontinent of bowels after suppository this morning.  Peri care performed and patient positioned on left side with pillows for support.  Dani Gobbleeardon, Lemya Greenwell J, RN
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
## 2018-01-24 NOTE — Plan of Care (Signed)
  Problem: Consults Goal: RH SPINAL CORD INJURY PATIENT EDUCATION Description  See Patient Education module for education specifics.  Outcome: Not Progressing Goal: Skin Care Protocol Initiated - if Braden Score 18 or less Description If consults are not indicated, leave blank or document N/A Outcome: Not Progressing   Problem: SCI BOWEL ELIMINATION Goal: RH STG MANAGE BOWEL WITH ASSISTANCE Description STG Manage Bowel with mod Assistance.  Outcome: Not Progressing Goal: RH STG SCI MANAGE BOWEL PROGRAM W/ASSIST OR AS APPROPRIATE Description STG SCI Manage bowel program w/mod assist or as appropriate.  Outcome: Not Progressing   Problem: SCI BLADDER ELIMINATION Goal: RH STG MANAGE BLADDER WITH ASSISTANCE Description STG Manage Bladder With mod Assistance  Outcome: Not Progressing Goal: RH STG MANAGE BLADDER WITH EQUIPMENT WITH ASSISTANCE Description STG Manage Bladder With Equipment With mod Assistance  Outcome: Not Progressing Goal: RH STG SCI MANAGE BLADDER PROGRAM W/ASSISTANCE Description Mod assist  Outcome: Not Progressing   Problem: RH SKIN INTEGRITY Goal: RH STG SKIN FREE OF INFECTION/BREAKDOWN Description Patients skin will remain free from further infection or breakdown with mod assist.  Outcome: Not Progressing Goal: RH STG MAINTAIN SKIN INTEGRITY WITH ASSISTANCE Description STG Maintain Skin Integrity With mod Assistance.  Outcome: Not Progressing Goal: RH STG ABLE TO PERFORM INCISION/WOUND CARE W/ASSISTANCE Description STG Able To Perform Incision/Wound Care With total Assistance from caregiver .  Outcome: Not Progressing   Problem: RH SAFETY Goal: RH STG ADHERE TO SAFETY PRECAUTIONS W/ASSISTANCE/DEVICE Description STG Adhere to Safety Precautions With mod Assistance/Device.  Outcome: Not Progressing   Problem: RH PAIN MANAGEMENT Goal: RH STG PAIN MANAGED AT OR BELOW PT'S PAIN GOAL Description < 4  Outcome:  Progressing   Problem: RH KNOWLEDGE  DEFICIT SCI Goal: RH STG INCREASE KNOWLEDGE OF SELF CARE AFTER SCI Description Mod assist  Outcome: Not Progressing    Pt w/o c/o pain, total assist in all areas.
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
## 2018-01-24 NOTE — Progress Notes (Signed)
Orthopedic Tech Progress Note Patient Details:  Brandon [name] [birth year] [id number]  Patient ID: Brandon [name], male   DOB: [birth year], 77 y.o.   MRN: [id number]   Nikki DomCrawford, Lochlann Mastrangelo 01/24/2018, 10:11 AM Called in hanger brace order; [facility]

## 2018-01-24 NOTE — Progress Notes (Signed)
Social Work Patient ID: Brandon [name], male   DOB: 12-27-1940, 77 y.o.   MRN: [id number]  Have reviewed team conference with pt's daughter, Brandon [name].  Attempted to engage with pt, however, still confused and poor arousal.  Have informed daughter of ELOS of 4 weeks and mod assist w/c level goals.  Stressed to her that his care needs expected to be extensive and long term.  She is very receptive to this and has a good understanding of the care needs but is concerned about their ability to provide this immediately following CIR.  Will continue to follow and assist with appropriate d/c planning.  Keitra Carusone, LCSW

## 2018-01-24 NOTE — Progress Notes (Signed)
Speech Language Pathology Daily Session Note  Patient Details  Name: Brandon [name] MRN: [id number] Date of Birth: 12-22-1940  Today's Date: 01/24/2018 SLP Individual Time: 0830-0930 SLP Individual Time Calculation (min): 60 min  Short Term Goals: Week 1: SLP Short Term Goal 1 (Week 1): Pt will consume therapeutic trials of Dysphagia III diet textures with timely oral phase + adequate oral clearance and without s/sx of aspiration across 3 consecutive sessions. SLP Short Term Goal 2 (Week 1): Pt will sustain attention to therapeutic tasks and/or conversation for ~15 minutes with less than 2 cues for redirection given Mod A cues. SLP Short Term Goal 3 (Week 1): Pt will complete functional, basic problem-solving tasks with Mod A cues.  SLP Short Term Goal 4 (Week 1): Pt will utilize external aids to recall functional information and daily events given Max A cues. SLP Short Term Goal 5 (Week 1): Pt will utilize speech intelligibility strategies to improve speech clarity/intelligibility to ~90% at the conversational level given Mod A cues.  Skilled Therapeutic Interventions: Skilled treatment session focused on cognition goals. SLP facilitated session by providing Mod A cues to sustain attention for ~ 15 minute intervals. SLP facilitated arousal by opening blinds, turning on light and repositioning pt. Pt with confusion concerning questions related to self with no awareness of errors/confusion. Pt required Total cues to recall month and information from earlier in morning and daily events. Pt with inconsistent information about likes/dislikes (states he likes young country music singers like [place]olly Parton). Question language of confusion? Pt's schedule was reviewed with him, he voiced immediate understanding and comprehension, pt left upright with bed alarm on and all needs within reach (soft touch call bell). Continue per current plan of care.      Function:  Eating Eating                  Cognition Comprehension Comprehension assist level: Understands basic 75 - 89% of the time/ requires cueing 10 - 24% of the time  Expression   Expression assist level: Expresses basic 75 - 89% of the time/requires cueing 10 - 24% of the time. Needs helper to occlude trach/needs to repeat words.  Social Interaction Social Interaction assist level: Interacts appropriately 50 - 74% of the time - May be physically or verbally inappropriate.  Problem Solving Problem solving assist level: Solves basic 25 - 49% of the time - needs direction more than half the time to initiate, plan or complete simple activities  Memory Memory assist level: Recognizes or recalls 25 - 49% of the time/requires cueing 50 - 75% of the time    Pain Pain Assessment Pain Scale: Faces Faces Pain Scale: No hurt  Therapy/Group: Individual Therapy  Rajeev Escue 01/24/2018, 11:33 AM
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
## 2018-01-25 ENCOUNTER — Inpatient Hospital Stay (HOSPITAL_COMMUNITY): Payer: Medicare Other | Admitting: Speech Pathology

## 2018-01-25 ENCOUNTER — Inpatient Hospital Stay (HOSPITAL_COMMUNITY): Payer: Medicare Other | Admitting: Occupational Therapy

## 2018-01-25 ENCOUNTER — Inpatient Hospital Stay (HOSPITAL_COMMUNITY): Payer: Medicare Other | Admitting: Physical Therapy

## 2018-01-25 DIAGNOSIS — G8250 Quadriplegia, unspecified: Secondary | ICD-10-CM

## 2018-01-25 DIAGNOSIS — I10 Essential (primary) hypertension: Secondary | ICD-10-CM

## 2018-01-25 DIAGNOSIS — R4182 Altered mental status, unspecified: Secondary | ICD-10-CM

## 2018-01-25 LAB — URINALYSIS, ROUTINE W REFLEX MICROSCOPIC
BILIRUBIN URINE: NEGATIVE
Bacteria, UA: NONE SEEN
GLUCOSE, UA: NEGATIVE mg/dL
KETONES UR: NEGATIVE mg/dL
Leukocytes, UA: NEGATIVE
NITRITE: NEGATIVE
PH: 7 (ref 5.0–8.0)
Protein, ur: NEGATIVE mg/dL
RBC / HPF: >50 RBC/hpf — ABNORMAL HIGH (ref 0–5)
Specific Gravity, Urine: 1.014 (ref 1.005–1.030)

## 2018-01-25 LAB — CBC
HCT: 38.6 % — ABNORMAL LOW (ref 39.0–52.0)
HEMOGLOBIN: 13 g/dL (ref 13.0–17.0)
MCH: 31.9 pg (ref 26.0–34.0)
MCHC: 33.7 g/dL (ref 30.0–36.0)
MCV: 94.8 fL (ref 78.0–100.0)
Platelets: 287 10*3/uL (ref 150–400)
RBC: 4.07 MIL/uL — ABNORMAL LOW (ref 4.22–5.81)
RDW: 12.3 % (ref 11.5–15.5)
WBC: 9.2 10*3/uL (ref 4.0–10.5)

## 2018-01-25 LAB — PROTIME-INR
INR: 1.44
PROTHROMBIN TIME: 17.5 s — AB (ref 11.4–15.2)

## 2018-01-25 LAB — BASIC METABOLIC PANEL
Anion gap: 5 (ref 5–15)
BUN: 22 mg/dL (ref 8–23)
CALCIUM: 8.2 mg/dL — AB (ref 8.9–10.3)
CO2: 26 mmol/L (ref 22–32)
Chloride: 105 mmol/L (ref 98–111)
Creatinine, Ser: 0.8 mg/dL (ref 0.61–1.24)
GFR calc Af Amer: >60 mL/min (ref >60)
GLUCOSE: 141 mg/dL — AB (ref 70–99)
Potassium: 4.1 mmol/L (ref 3.5–5.1)
SODIUM: 136 mmol/L (ref 135–145)

## 2018-01-25 MED ORDER — WARFARIN SODIUM 2.5 MG PO TABS
2.5000 mg | ORAL_TABLET | Freq: Once | ORAL | Status: AC
  Administered 2018-01-25: 2.5 mg via ORAL
  Filled 2018-01-25: qty 1

## 2018-01-25 MED ORDER — SODIUM CHLORIDE 0.9 % IV SOLN
1.0000 g | INTRAVENOUS | Status: DC
  Administered 2018-01-25 – 2018-01-29 (×6): 1 g via INTRAVENOUS
  Filled 2018-01-25 (×4): qty 10
  Filled 2018-01-25: qty 1

## 2018-01-25 MED ORDER — ESCITALOPRAM OXALATE 10 MG PO TABS
10.0000 mg | ORAL_TABLET | Freq: Every day | ORAL | Status: DC
  Administered 2018-01-26 – 2018-02-21 (×27): 10 mg via ORAL
  Filled 2018-01-25 (×28): qty 1

## 2018-01-25 NOTE — Progress Notes (Signed)
Speech Language Pathology Weekly Progress and Session Note  Patient Details  Name: Brandon [name] MRN: [id number] Date of Birth: 09-Nov-1940  Beginning of progress report period: January 20, 2018 End of progress report period: January 25, 2018  Today's Date: 01/25/2018   Skilled treatment session #1 SLP Individual Time: 0730-0830 SLP Individual Time Calculation (min): 60 min   Skilled treatment session #2 SLP Individual Time: 10:30-11:30 SLP Individual Time Calculation (min): 60 min  Short Term Goals: Week 1: SLP Short Term Goal 1 (Week 1): Pt will consume therapeutic trials of Dysphagia III diet textures with timely oral phase + adequate oral clearance and without s/sx of aspiration across 3 consecutive sessions. SLP Short Term Goal 1 - Progress (Week 1): Not met SLP Short Term Goal 2 (Week 1): Pt will sustain attention to therapeutic tasks and/or conversation for ~15 minutes with less than 2 cues for redirection given Mod A cues. SLP Short Term Goal 2 - Progress (Week 1): Not met SLP Short Term Goal 3 (Week 1): Pt will complete functional, basic problem-solving tasks with Mod A cues.  SLP Short Term Goal 3 - Progress (Week 1): Not met SLP Short Term Goal 4 (Week 1): Pt will utilize external aids to recall functional information and daily events given Max A cues. SLP Short Term Goal 4 - Progress (Week 1): Not met SLP Short Term Goal 5 (Week 1): Pt will utilize speech intelligibility strategies to improve speech clarity/intelligibility to ~90% at the conversational level given Mod A cues. SLP Short Term Goal 5 - Progress (Week 1): Not met    New Short Term Goals: Week 2: SLP Short Term Goal 1 (Week 2): Pt will consume therapeutic trials of Dysphagia III diet textures with timely oral phase + adequate oral clearance and without s/sx of aspiration across 3 consecutive sessions. SLP Short Term Goal 2 (Week 2): Pt will sustain attention to therapeutic tasks and/or conversation for ~15 minutes  with less than 2 cues for redirection given Mod A cues. SLP Short Term Goal 3 (Week 2): Pt will complete functional, basic problem-solving tasks with Mod A cues.  SLP Short Term Goal 4 (Week 2): Pt will utilize external aids to recall functional information and daily events given Max A cues. SLP Short Term Goal 5 (Week 2): Pt will utilize speech intelligibility strategies to improve speech clarity/intelligibility to ~90% at the conversational level given Mod A cues.  Weekly Progress Updates: Pt has had some intermittent sessions that were productive in targeting his STGs but overall his progress this reporting period has been hindered by bouts of delirium and significant language of confusion. Pt requires Max A multimodal cues for all tasks  and as a result he continues to required skilled ST to target the above goals.      Intensity: Minumum of 1-2 x/day, 30 to 90 minutes Frequency: 3 to 5 out of 7 days Duration/Length of Stay: 4 weeks Treatment/Interventions: Cognitive remediation/compensation;Cueing hierarchy;Internal/external aids;Dysphagia/aspiration precaution training;Patient/family education;Functional tasks   Daily Session  Skilled Therapeutic Interventions:   Skilled treatment session #1 focused on dysphagia and cognition goals. SLP received pt in bed and required Total A for arousal, SLP opened blinds, turned on light and tranfsered pt out of bed into wheelchair for increased orientation to situation and to promotoe alertness. Pt with continued language of confusion throughout task with no ability to initiate (look) in direction for bed mobility. Despite Total A pt was not able to comprehend/be redirected to orientation information to place or situation.  Language of confusion and delirium continued throughtout session and interfered with consumption of breakfast tray (consumed < 25%). Despite Total A and manipulation of environment (wiping eyes, lowering blinds) pt was not able to open  eyes (d/t cognition - not lethargy). Pt had 2 episodes of delirium with inappropriate comments. MD made aware of further decline in cognitive function - MD advises this is likely d/t brain injury but will assess. At end of session, Pt able to open eyes to look at flower that was placed in his hand. He was able to accurately state color of each flower x 3 and correct season each color would be related too (pink - spring, orange - fall, red - summer). Pt was frequently frustrated by SLP attempts to redirect throughout session.    Skilled treatment session #2 focused on cognition goals. SLP facilitated session by providing Max A for sustained attention and Min A cues for basic simple yes/no questions. As session progressed, pt with increased language of confusion and decreased ability to perform skilloed tasks. Pt perseverative on having something in hand to set on table. Nothing in pt's hand and table not present. Pt was not able to redirected from moments of delirium/hallucinations. Nursing advises that MD has discontinued some pain medication and will start antibiotics with UA to be performed.     Function:   Eating Eating   Modified Consistency Diet: Yes Eating Assist Level: Helper feeds patient           Cognition Comprehension Comprehension assist level: Understands basic less than 25% of the time/ requires cueing >75% of the time  Expression   Expression assist level: Expresses basic 50 - 74% of the time/requires cueing 25 - 49% of the time. Needs to repeat parts of sentences.;Expresses basic 25 - 49% of the time/requires cueing 50 - 75% of the time. Uses single words/gestures.  Social Interaction Social Interaction assist level: Interacts appropriately less than 25% of the time. May be withdrawn or combative.;Interacts appropriately 25 - 49% of time - Needs frequent redirection.  Problem Solving Problem solving assist level: Solves basic less than 25% of the time - needs direction nearly  all the time or does not effectively solve problems and may need a restraint for safety  Memory Memory assist level: Recognizes or recalls less than 25% of the time/requires cueing greater than 75% of the time   General    Pain Pain Assessment Pain Scale: Faces Faces Pain Scale: No hurt  Therapy/Group: Individual Therapy  Muhammadali Ries 01/25/2018, 10:49 AM

## 2018-01-25 NOTE — Progress Notes (Signed)
UA C&S collected at 1215 via I&O cath. Pt tolerated well. Amber cloudy urine noted. No odor noted. Pt label and req sent down with urine.  Ross LudwigKAYLA M Lilliauna Van, LPN 16/10/96,04:5408/22/19,12:37 PM

## 2018-01-25 NOTE — Plan of Care (Signed)
  Problem: Consults Goal: RH SPINAL CORD INJURY PATIENT EDUCATION Description  See Patient Education module for education specifics.  Outcome: Progressing Goal: Skin Care Protocol Initiated - if Braden Score 18 or less Description If consults are not indicated, leave blank or document N/A Outcome: Progressing   Problem: SCI BOWEL ELIMINATION Goal: RH STG SCI MANAGE BOWEL PROGRAM W/ASSIST OR AS APPROPRIATE Description STG SCI Manage bowel program w/mod assist or as appropriate.  Outcome: Progressing   Problem: RH SKIN INTEGRITY Goal: RH STG SKIN FREE OF INFECTION/BREAKDOWN Description Patients skin will remain free from further infection or breakdown with mod assist.  Outcome: Progressing Goal: RH STG MAINTAIN SKIN INTEGRITY WITH ASSISTANCE Description STG Maintain Skin Integrity With mod Assistance.  Outcome: Progressing Goal: RH STG ABLE TO PERFORM INCISION/WOUND CARE W/ASSISTANCE Description STG Able To Perform Incision/Wound Care With total Assistance from caregiver .  Outcome: Progressing   Problem: RH SAFETY Goal: RH STG ADHERE TO SAFETY PRECAUTIONS W/ASSISTANCE/DEVICE Description STG Adhere to Safety Precautions With mod Assistance/Device.  Outcome: Progressing   Problem: RH PAIN MANAGEMENT Goal: RH STG PAIN MANAGED AT OR BELOW PT'S PAIN GOAL Description < 4  Outcome: Progressing   Problem: RH KNOWLEDGE DEFICIT SCI Goal: RH STG INCREASE KNOWLEDGE OF SELF CARE AFTER SCI Description Mod assist  Outcome: Progressing   Problem: SCI BOWEL ELIMINATION Goal: RH STG MANAGE BOWEL WITH ASSISTANCE Description STG Manage Bowel with mod Assistance.  Outcome: Not Progressing   Problem: SCI BLADDER ELIMINATION Goal: RH STG MANAGE BLADDER WITH ASSISTANCE Description STG Manage Bladder With mod Assistance  Outcome: Not Progressing Goal: RH STG MANAGE BLADDER WITH EQUIPMENT WITH ASSISTANCE Description STG Manage Bladder With Equipment With mod Assistance  Outcome:  Not Progressing Goal: RH STG SCI MANAGE BLADDER PROGRAM W/ASSISTANCE Description Mod assist  Outcome: Not Progressing   Pt requiring I&O cath Q8h d/t no voids.  Ross LudwigKAYLA M Pietrina Jagodzinski, LPN
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
## 2018-01-25 NOTE — Progress Notes (Signed)
Physical Therapy Session Note  Patient Details  Name: Brandon [name] MRN: [id number] Date of Birth: [birth year]  Today's Date: 01/25/2018 PT Individual Time: 1515-1600 PT Individual Time Calculation (min): 45 min   Short Term Goals: Week 1:  PT Short Term Goal 1 (Week 1): Pt will perform bed mobility max assist +1 PT Short Term Goal 2 (Week 1): Pt will tolerate sitting in w/c for 60 min in between therapy sessions w/o increase in fatigue  PT Short Term Goal 3 (Week 1): Pt will maintain static sitting balance w/ max assist PT Short Term Goal 4 (Week 1): Pt will initiate standing w/ LRAD to work on standing tolerance   Skilled Therapeutic Interventions/Progress Updates:    Pt reports cervical neck pain prior to session start with inability to quantify on NPRS. Therapist notes facial grimacing intermittently throughout session and monitors pain levels. Pt is easily aroused with min tactile cues and able to maintain level of alertness for 45 min with verbal cues. Therapist attempts basic functional sorting activity with HOB elevated with Pt unable to follow 1- step commands. PT attempted to facilitate sorting activity with hand over hand assist; however, tactile facilitation increases Pt agitation but easily redirected. Therapist attempted to engage Pt in meaningful conversation providing max multimodal cues to maintain attention to conversation topic. Pt able to maintain conversation topic for <20 sec before requiring cues for attention. Pt became fixated and upset when telling therapist his neighbor has been trying to "kill his dogs with a bow and arrow." Pt demonstrates tangential language of confusion, and observably disoriented requiring max cues to redirect. PT provided x2 heat packs to upper trap region for pain relief and left patient in supine with soft call bell in reach and all needs met. Pt missed time during session due to cognitive decline.  Therapy Documentation Precautions:   Precautions Precautions: Fall, Cervical Required Braces or Orthoses: Cervical Brace Cervical Brace: Hard collar, Other (comment)(Don/doff in sitting. Ok to doff in bed, when ambulating to bathroom, and when showering per MD order) Restrictions Weight Bearing Restrictions: No   See Function Navigator for Current Functional Status.   Therapy/Group: Individual Therapy  Floreen Comber 01/25/2018, 4:16 PM
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
## 2018-01-25 NOTE — Progress Notes (Signed)
Occupational Therapy Session Note  Patient Details  Name: Brandon [name] MRN: [id number] Date of Birth: March 19, 1941  Today's Date: 01/25/2018 OT Individual Time: [id number] OT Individual Time Calculation (min): 10 min  and Today's Date: 01/25/2018 OT Missed Time: 60 Minutes Missed Time Reason: Other (comment)(unable to arouse)   Short Term Goals: Week 1:  OT Short Term Goal 1 (Week 1): Pt will initiate washing 1 body area during ADL session  OT Short Term Goal 2 (Week 1): Pt will complete UB dressing EOB with 1 helper to improve balance OT Short Term Goal 3 (Week 1): Pt will complete 1 grooming task with AE as needed and Mod A  Skilled Therapeutic Interventions/Progress Updates:    Upon entering the room, pt supine in bed and lethargic. OT attempting to wake pt and placing cold cloth over face without pt opening eyes or acknowledging. OT unable to wake pt this session. RN reports pain medication given prior to OT arrival. Pt missing 60 minutes this session secondary to lethargy. Bed alarm activated and soft call bell within reach of pt.    Therapy Documentation Precautions:  Precautions Precautions: Fall, Cervical Required Braces or Orthoses: Cervical Brace Cervical Brace: Hard collar, Other (comment)(Don/doff in sitting. Ok to doff in bed, when ambulating to bathroom, and when showering per MD order) Restrictions Weight Bearing Restrictions: No General: General OT Amount of Missed Time: 60 Minutes Vital Signs: Therapy Vitals Temp: 98.4 F (36.9 C) Temp Source: Oral Pulse Rate: 64 Resp: 18 BP: (!) 91/54 Patient Position (if appropriate): Lying Oxygen Therapy SpO2: 97 % O2 Device: Room Air ADL: ADL ADL Comments: Please see functional navigator for ADL status  See Function Navigator for Current Functional Status.   Therapy/Group: Individual Therapy  Alen BleacherBradsher, Tieara Flitton P 01/25/2018, 4:10 PM

## 2018-01-26 ENCOUNTER — Inpatient Hospital Stay (HOSPITAL_COMMUNITY): Payer: Medicare Other | Admitting: Speech Pathology

## 2018-01-26 ENCOUNTER — Inpatient Hospital Stay (HOSPITAL_COMMUNITY): Payer: Medicare Other | Admitting: Occupational Therapy

## 2018-01-26 ENCOUNTER — Inpatient Hospital Stay (HOSPITAL_COMMUNITY): Payer: Medicare Other | Admitting: Physical Therapy

## 2018-01-26 LAB — PROTIME-INR
INR: 1.64
Prothrombin Time: 19.2 seconds — ABNORMAL HIGH (ref 11.4–15.2)

## 2018-01-26 MED ORDER — WARFARIN SODIUM 5 MG PO TABS
5.0000 mg | ORAL_TABLET | Freq: Once | ORAL | Status: AC
  Administered 2018-01-26: 5 mg via ORAL
  Filled 2018-01-26: qty 1

## 2018-01-26 NOTE — Progress Notes (Signed)
Physical Therapy Session Note  Patient Details  Name: Brandon [name] MRN: [id number] Date of Birth: 11/25/1940  Today's Date: 01/26/2018 PT Individual Time: 1000-1100 PT Individual Time Calculation (min): 60 min   Short Term Goals: Week 1:  PT Short Term Goal 1 (Week 1): Pt will perform bed mobility max assist +1 PT Short Term Goal 2 (Week 1): Pt will tolerate sitting in w/c for 60 min in between therapy sessions w/o increase in fatigue  PT Short Term Goal 3 (Week 1): Pt will maintain static sitting balance w/ max assist PT Short Term Goal 4 (Week 1): Pt will initiate standing w/ LRAD to work on standing tolerance   Skilled Therapeutic Interventions/Progress Updates:    Pt reports 4/10 cervical neck pain prior to session start with pain increasing during movement. Therapist monitored pain levels throughout session. During session, Pt able to maintain meaningful conversation with min verbal cueing for recall. PT provided Pt education helping to assist Pt with awareness of deficits and cervical procedure with Pt asking appropriate questions. Pt more alert today than he was yesterday with improvement in motivation during session with ability to follow 1 step commands to sort spoons & forks with min A for fine motor. Pt requires total A +1 for slide board transfer from w/c<>mat with therapist providing correct sequencing, w/c management, and maintaining head hip relationship. Pt performs sitting edge of mat<>supine on wedge with 2 helpers for trunk & LE management. PT provides passive stretching for increased flexibility to B LE's for 3x1 min duration for hamstrings, IR's, and glute max with notable facial grimacing 2/2 inc tightness. Pt hamstring ROM limited significantly to B LE's. Slide board transfer back to chair total A +1 and Pt returned to room with soft call bell in place, nursing notified Pt request to void, and all needs met.  Therapy Documentation Precautions:  Precautions Precautions:  Fall, Cervical Required Braces or Orthoses: Cervical Brace Cervical Brace: Hard collar, Other (comment)(Don/doff in sitting. Ok to doff in bed, when ambulating to bathroom, and when showering per MD order) Restrictions Weight Bearing Restrictions: No   See Function Navigator for Current Functional Status.   Therapy/Group: Individual Therapy  Floreen Comber 01/26/2018, 1:58 PM
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
## 2018-01-26 NOTE — Progress Notes (Signed)
Speech Language Pathology Daily Session Note  Patient Details  Name: Laray AngerMichael Moose MRN: [id number] Date of Birth: 05/11/1941  Today's Date: 01/26/2018 SLP Individual Time: 0700-0755 SLP Individual Time Calculation (min): 55 min  Short Term Goals: Week 2: SLP Short Term Goal 1 (Week 2): Pt will consume therapeutic trials of Dysphagia III diet textures with timely oral phase + adequate oral clearance and without s/sx of aspiration across 3 consecutive sessions. SLP Short Term Goal 2 (Week 2): Pt will sustain attention to therapeutic tasks and/or conversation for ~15 minutes with less than 2 cues for redirection given Mod A cues. SLP Short Term Goal 3 (Week 2): Pt will complete functional, basic problem-solving tasks with Mod A cues.  SLP Short Term Goal 4 (Week 2): Pt will utilize external aids to recall functional information and daily events given Max A cues. SLP Short Term Goal 5 (Week 2): Pt will utilize speech intelligibility strategies to improve speech clarity/intelligibility to ~90% at the conversational level given Mod A cues.  Skilled Therapeutic Interventions: Skilled treatment session focused on cognitive and dysphagia goals. Upon arrival, patient was semi asleep and talking aloud but awakened to voice.  SLP facilitated session by providing Mod A verbal cues for orientation to place and time and Max A verbal cues for sustained attention to tasks due to lethargy. Patient was pleasantly confused with intermittent language of confusion and confabulation. Patient consumed ~25% of breakfast without overt s/s of aspiration with frequent rest breaks requested due to fatigue. Patient left upright in bed with alarm on and all needs within reach. Continue with current plan of care.      Function:  Eating Eating   Modified Consistency Diet: Yes Eating Assist Level: Helper feeds patient           Cognition Comprehension Comprehension assist level: Understands basic less than 25% of the  time/ requires cueing >75% of the time  Expression   Expression assist level: Expresses basic 50 - 74% of the time/requires cueing 25 - 49% of the time. Needs to repeat parts of sentences.  Social Interaction Social Interaction assist level: Interacts appropriately 25 - 49% of time - Needs frequent redirection.  Problem Solving Problem solving assist level: Solves basic less than 25% of the time - needs direction nearly all the time or does not effectively solve problems and may need a restraint for safety  Memory Memory assist level: Recognizes or recalls less than 25% of the time/requires cueing greater than 75% of the time    Pain No/Denies Pain   Therapy/Group: Individual Therapy  Xitlally Mooneyham 01/26/2018, 12:32 PM

## 2018-01-26 NOTE — Progress Notes (Addendum)
Nurse f/u on lab results. Last UA was collected on 8/22 at 12:38pm. UA cloudy but negative. Same results were present on 8/21. MD made aware.
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
## 2018-01-26 NOTE — Progress Notes (Deleted)
Pt was discharged after lunch. Last lovenox was given. Pt denied any pain. Family was present. Staff pushed pt in wheelchair to car. Family pushed belongings on a cart. No concerns mentioned before discharge.

## 2018-01-26 NOTE — Plan of Care (Signed)
Continues to receive abt in right arm. C/o pain this morning but was able to complete therapy. Mr. Brandon [name] has been alert and talkative with staff most of the shift. Poor appetite continues. Fluids consumed more than food. Cream to rash continues. Naps encouraged in between therapy when available.

## 2018-01-26 NOTE — Progress Notes (Signed)
Physical Therapy Session Note  Patient Details  Name: Brandon [name] MRN: [id number] Date of Birth: 03/03/1941  Today's Date: 01/26/2018 PT Individual Time: 0165-8006 PT Individual Time Calculation (min): 25 min   Short Term Goals: Week 1:  PT Short Term Goal 1 (Week 1): Pt will perform bed mobility max assist +1 PT Short Term Goal 2 (Week 1): Pt will tolerate sitting in w/c for 60 min in between therapy sessions w/o increase in fatigue  PT Short Term Goal 3 (Week 1): Pt will maintain static sitting balance w/ max assist PT Short Term Goal 4 (Week 1): Pt will initiate standing w/ LRAD to work on standing tolerance   Skilled Therapeutic Interventions/Progress Updates:    Patient received up in tilt in space WC, RN present and reports patient less confused today, OK to participate in PT. Patient initially answering basic questions and interacting on basic level with therapist today. Attempted UE bike with totalA to hold hands on bike for improved UE motion and for consistent focus on task, however patient reports high levels of pain and activity stopped. Attempted sorting activity at side of table as well as trunk control based activities, patient requires Max cues and MaxA for participation in trunk control tasks when not on supported back rest. Patient became very lethargic at end of session, stopped interacting with therapist, at which point he was returned to his room where he began to interact with PT again. He was left up in the [facility] with all needs met, lap belt applied, and RN attending.   Therapy Documentation Precautions:  Precautions Precautions: Fall, Cervical Required Braces or Orthoses: Cervical Brace Cervical Brace: Hard collar, Other (comment)(Don/doff in sitting. Ok to doff in bed, when ambulating to bathroom, and when showering per MD order) Restrictions Weight Bearing Restrictions: No General: PT Amount of Missed Time (min): 5 Minutes(lethargy/pain ) Vital Signs:  Pain:  during activity high levels of pain (patient unable to rate), appears to stop when activity stops      See Function Navigator for Current Functional Status.   Therapy/Group: Individual Therapy  Deniece Ree PT, DPT, CBIS  Supplemental Physical Therapist [facility]   Pager [phone]   01/26/2018, 2:10 PM

## 2018-01-26 NOTE — Progress Notes (Signed)
Occupational Therapy Session Note  Patient Details  Name: Brandon [name] MRN: [id number] Date of Birth: 02-Jan-1941  Today's Date: 01/26/2018 OT Individual Time: [id number] OT Individual Time Calculation (min): 40 min   Short Term Goals: Week 1:  OT Short Term Goal 1 (Week 1): Pt will initiate washing 1 body area during ADL session  OT Short Term Goal 2 (Week 1): Pt will complete UB dressing EOB with 1 helper to improve balance OT Short Term Goal 3 (Week 1): Pt will complete 1 grooming task with AE as needed and Mod A  Skilled Therapeutic Interventions/Progress Updates:    Pt greeted in TIS, still confused but willing to participate in tx. Worked on UE NMR via oral care/grooming tasks at sink with TIS placed in neutral. OT facilitated use of universal cuff to manage toothbrush with L UE. Pt able to bring forearm to mouth against gravity to meet task demands [facility] for precisely reaching mouth. Pt frequently reports he can't see things, however often responds correctly to identify colors of ADL items. HOH for turning on/off faucet with pt able to activate proximal muscles. He required mod-max vcs for redirection to tasks due to tangential/nonsensical speech ("wait- I need the electronic dishwasher.") Pt following 1 step instruction 75% of the time during lotion application to forearms and handwashing tasks. Educated fiance on simple occupation-based NMR tasks she could facilitate in room with him (like putting on lotion). At end of session he was reclined in TIS with elbow protectors donned and safety belt fastened. Encouraged fiance to roll pt up/down west hallway and into dayroom for stimulation purposes. She verbalized understanding. Pt left with fiance at end of tx.   Therapy Documentation Precautions:  Precautions Precautions: Fall, Cervical Required Braces or Orthoses: Cervical Brace Cervical Brace: Hard collar, Other (comment)(Don/doff in sitting. Ok to doff in bed, when ambulating to  bathroom, and when showering per MD order) Restrictions Weight Bearing Restrictions: No Vital Signs: Therapy Vitals Pulse Rate: 63 Resp: 14 BP: 128/84 Patient Position (if appropriate): Sitting Oxygen Therapy SpO2: 97 % O2 Device: Room Air Pain: Pt c/o neck pain. Provided prop to increase neck support when upright in TIS.    ADL: ADL ADL Comments: Please see functional navigator for ADL status     See Function Navigator for Current Functional Status.   Therapy/Group: Individual Therapy  Kieon Lawhorn A Jasia Hiltunen 01/26/2018, 4:35 PM

## 2018-01-26 NOTE — Progress Notes (Signed)
Occupational Therapy Session Note  Patient Details  Name: Brandon [name] MRN: [id number] Date of Birth: Jul 01, 1940  Today's Date: 01/26/2018 OT Individual Time: [id number] OT Individual Time Calculation (min): 70 min   Short Term Goals: Week 1:  OT Short Term Goal 1 (Week 1): Pt will initiate washing 1 body area during ADL session  OT Short Term Goal 2 (Week 1): Pt will complete UB dressing EOB with 1 helper to improve balance OT Short Term Goal 3 (Week 1): Pt will complete 1 grooming task with AE as needed and Mod A  Skilled Therapeutic Interventions/Progress Updates:    Pt greeted supine in bed with c/o buttocks pain. RN made aware. He appeared pleasantly confused this AM, listening to Eligah EastElton John on room computer. Verbalizing that he wanted "to shower, dress, and shave." Tx focus on initiation, alertness, NMR, and functional transfers during bathing and dressing at shower level. Maxi transfer with +2 assist to roll in shower chair. Pt with incontinent bowel movement while in lift pad. He bathed while seated with HOH and tactile cues to wash face and upper portion of chest. Pt able to abduct/lift arms bilaterally with instruction to assist with washing tasks. Also able to wash face holding wash cloth with L UE. Pt with significant Rt lean in shower. Able to initiate correction with cuing. LB dressing completed bedlevel after with Mod A rolling Lt<Rt and 2 helpers. Slideboard transfer<w/c completed with Total A x2. While up in w/c, pt able to "punch" each UE into shirt sleeves with instruction and Mod A. Hair brushed by therapist and glasses were donned. He was repositioned to support UEs and prevent rubbing to elbows when upright in chair. Pt reclined for comfort with lap belt and left at RN station, in care of his RN for morning medication.    Therapy Documentation Precautions:  Precautions Precautions: Fall, Cervical Required Braces or Orthoses: Cervical Brace Cervical Brace: Hard collar, Other  (comment)(Don/doff in sitting. Ok to doff in bed, when ambulating to bathroom, and when showering per MD order) Restrictions Weight Bearing Restrictions: No Pain: In buttocks, reclined for comfort at end of session. RN made aware.    ADL: ADL ADL Comments: Please see functional navigator for ADL status     See Function Navigator for Current Functional Status.   Therapy/Group: Individual Therapy  Dick Hark A Marvina Danner 01/26/2018, 12:15 PM

## 2018-01-27 ENCOUNTER — Inpatient Hospital Stay (HOSPITAL_COMMUNITY): Payer: Medicare Other | Admitting: Physical Therapy

## 2018-01-27 DIAGNOSIS — K592 Neurogenic bowel, not elsewhere classified: Secondary | ICD-10-CM

## 2018-01-27 DIAGNOSIS — I480 Paroxysmal atrial fibrillation: Secondary | ICD-10-CM

## 2018-01-27 DIAGNOSIS — N319 Neuromuscular dysfunction of bladder, unspecified: Secondary | ICD-10-CM

## 2018-01-27 DIAGNOSIS — F1010 Alcohol abuse, uncomplicated: Secondary | ICD-10-CM

## 2018-01-27 DIAGNOSIS — G8918 Other acute postprocedural pain: Secondary | ICD-10-CM

## 2018-01-27 DIAGNOSIS — I10 Essential (primary) hypertension: Secondary | ICD-10-CM

## 2018-01-27 LAB — URINALYSIS, COMPLETE (UACMP) WITH MICROSCOPIC
BILIRUBIN URINE: NEGATIVE
Bacteria, UA: NONE SEEN
Glucose, UA: NEGATIVE mg/dL
KETONES UR: NEGATIVE mg/dL
Leukocytes, UA: NEGATIVE
Nitrite: NEGATIVE
PROTEIN: NEGATIVE mg/dL
Specific Gravity, Urine: 1.018 (ref 1.005–1.030)
pH: 7 (ref 5.0–8.0)

## 2018-01-27 LAB — PROTIME-INR
INR: 1.84
Prothrombin Time: 21.1 seconds — ABNORMAL HIGH (ref 11.4–15.2)

## 2018-01-27 MED ORDER — FOLIC ACID 1 MG PO TABS
1.0000 mg | ORAL_TABLET | Freq: Every day | ORAL | Status: DC
  Administered 2018-01-27 – 2018-01-30 (×4): 1 mg via ORAL
  Filled 2018-01-27 (×4): qty 1

## 2018-01-27 MED ORDER — WARFARIN SODIUM 5 MG PO TABS
5.0000 mg | ORAL_TABLET | Freq: Once | ORAL | Status: AC
  Administered 2018-01-27: 5 mg via ORAL
  Filled 2018-01-27: qty 1

## 2018-01-27 MED ORDER — THIAMINE HCL 100 MG/ML IJ SOLN
100.0000 mg | Freq: Every day | INTRAMUSCULAR | Status: DC
  Administered 2018-01-27 – 2018-01-29 (×3): 100 mg via INTRAVENOUS
  Filled 2018-01-27 (×5): qty 1

## 2018-01-27 MED ORDER — PANTOPRAZOLE SODIUM 40 MG PO TBEC
40.0000 mg | DELAYED_RELEASE_TABLET | Freq: Every day | ORAL | Status: DC
  Administered 2018-01-27 – 2018-02-27 (×30): 40 mg via ORAL
  Filled 2018-01-27 (×32): qty 1

## 2018-01-27 NOTE — Progress Notes (Signed)
Physical Therapy Weekly Progress Note  Patient Details  Name: Brandon [name] MRN: [id number] Date of Birth: 12-Oct-1940  Beginning of progress report period: January 20, 2018 End of progress report period: January 27, 2018  Today's Date: 01/27/2018 PT Individual Time: 1100-1200 PT Individual Time Calculation (min): 60 min   Patient has met 2 of 4 short term goals.  Pt continues to require max to total A x 1-2 for bed mobility as well as sliding board transfers due to decreased alertness and decreased sitting balance. Pt is making slow progress towards therapy goals.  Patient continues to demonstrate the following deficits muscle weakness, unbalanced muscle activation and decreased coordination and decreased sitting balance, decreased postural control and decreased balance strategies and therefore will continue to benefit from skilled PT intervention to increase functional independence with mobility.  Patient progressing toward long term goals..  Continue plan of care.  PT Short Term Goals Week 1:  PT Short Term Goal 1 (Week 1): Pt will perform bed mobility max assist +1 PT Short Term Goal 1 - Progress (Week 1): Progressing toward goal PT Short Term Goal 2 (Week 1): Pt will tolerate sitting in w/c for 60 min in between therapy sessions w/o increase in fatigue  PT Short Term Goal 2 - Progress (Week 1): Met PT Short Term Goal 3 (Week 1): Pt will maintain static sitting balance w/ max assist PT Short Term Goal 3 - Progress (Week 1): Met PT Short Term Goal 4 (Week 1): Pt will initiate standing w/ LRAD to work on standing tolerance  PT Short Term Goal 4 - Progress (Week 1): Progressing toward goal Week 2:  PT Short Term Goal 1 (Week 2): Pt will maintain static sitting balance with mod A PT Short Term Goal 2 (Week 2): Pt will perform sliding board transfer with assist x 1 consistently PT Short Term Goal 3 (Week 2): Pt will initiate standing with LRAD to work on standing tolerance  Skilled  Therapeutic Interventions/Progress Updates:  Pt received supine in bed, agreeable to PT. Pt appears very lethargic this AM and is encouraged to keep his eyes open throughout therapy session. Pt requests to use the bathroom prior to getting up to w/c, reports he needs to have a bowel movement and urinate. Rolling to the R with max A to place bedpan. Pt unable to void in urinal or in bedpan. Rolling L/R with max A x 2 and use of bedrail to don pants dependently. Supine to sit with assist x 2 for BLE management and trunk control. Max A to maintain sitting balance EOB, pt leans posteriorly. Sliding board transfer bed to w/c total A x 2. Pt dependent to don shirt while seated in w/c, max A for forward lean. Attempt to engage with patient while seated in w/c to have him correct posture (leaning to the L) with use of tactile cues and mirror for visual feedback. Pt is able to correct with max A. Pt keeps eyes closed throughout therapy session and gives minimal verbal responses to questions. Pt left reclined in TIS w/c in room with quick-release belt in place, soft touch call button in reach.  Therapy Documentation Precautions:  Precautions Precautions: Fall, Cervical Required Braces or Orthoses: Cervical Brace Cervical Brace: Hard collar, Other (comment)(Don/doff in sitting. Ok to doff in bed, when ambulating to bathroom, and when showering per MD order) Restrictions Weight Bearing Restrictions: No  See Function Navigator for Current Functional Status.  Therapy/Group: Individual Therapy  Excell Seltzer, PT, DPT  01/27/2018,  4:11 PM
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
## 2018-01-27 NOTE — Plan of Care (Signed)
Poor memory/cognitive skills. Intermittent confusion.  LBM 8/22

## 2018-01-27 NOTE — Progress Notes (Signed)
At the beginning of the shift, patients family member was in his room & patient was calm, talking sensibly & responding appropriately for most of his sentences. His family member left & at approximately 2015, patient was calling out into the hallway,screaming "help, somebody help me!" This nurse went into the patients room & patient was safe, but very anxious. He stated that he wanted someone to get him up so that he could go to his room. Last night, the patient was found to be disoriented a couple of times & had to be reoriented to place, time & situation. He tried to remove his aspen collar & did remove one of his resting hand splints twice. Tonight, he did the same but was breathing hard & stated that he could not breathe. He started to pull at his collar & scratch while attempting to do his vitals. Vitals were taken & the only deviance was his blood pressure & respirations, which were elevated. Patient was coached in his breathing & reassured that he was safe. His HOB was elevated, & breathing techniques were reinforced multiple times. He was reoriented again & patient began to calm down. His medications were given & tolerated well.He stated that he felt better. His breathing was back down to normal.  Music was used as a diversional therapy, HOB left up at semi-fowlers & lights were left on. He seems to have less episodes of anxiety this way. This nurse stayed with patient until he stated to nod off slightly & was calm. So far in the shift, at this time the interventions have worked. Will continue to monitor.
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
## 2018-01-28 ENCOUNTER — Inpatient Hospital Stay (HOSPITAL_COMMUNITY): Payer: Medicare Other

## 2018-01-28 ENCOUNTER — Inpatient Hospital Stay (HOSPITAL_COMMUNITY): Payer: Medicare Other | Admitting: Physical Therapy

## 2018-01-28 DIAGNOSIS — N390 Urinary tract infection, site not specified: Secondary | ICD-10-CM

## 2018-01-28 DIAGNOSIS — R1310 Dysphagia, unspecified: Secondary | ICD-10-CM

## 2018-01-28 LAB — PROTIME-INR
INR: 2.07
Prothrombin Time: 23.2 seconds — ABNORMAL HIGH (ref 11.4–15.2)

## 2018-01-28 LAB — URINE CULTURE: CULTURE: NO GROWTH

## 2018-01-28 MED ORDER — WARFARIN SODIUM 2.5 MG PO TABS
2.5000 mg | ORAL_TABLET | Freq: Once | ORAL | Status: AC
  Administered 2018-01-28: 2.5 mg via ORAL
  Filled 2018-01-28: qty 1

## 2018-01-28 NOTE — Progress Notes (Signed)
Physical Therapy Session Note  Patient Details  Name: Brandon [name] MRN: [id number] Date of Birth: 03/18/1941  Today's Date: 01/28/2018 PT Individual Time: 1000-1100 PT Individual Time Calculation (min): 60 min   Short Term Goals: Week 2:  PT Short Term Goal 1 (Week 2): Pt will maintain static sitting balance with mod A PT Short Term Goal 2 (Week 2): Pt will perform sliding board transfer with assist x 1 consistently PT Short Term Goal 3 (Week 2): Pt will initiate standing with LRAD to work on standing tolerance  Skilled Therapeutic Interventions/Progress Updates: Pt received seated in bed, fiance present; denies pain and agreeable to treatment. BLE PROM hamstrings/gastroc/soleus/hip IR/ER x30 sec-1 min each; moderate hypertonicity and spasms observed. Supine>sit via logroll with maxA; improving ability to initiate moving LEs off EOB, and reaching with UE to facilitate roll. MaxA sitting balance on EOB. Lateral scoot transfer bed <>w/c with transfer board and max/totalA +2; cued pt to initiate BLE pushing to A with scooting however minimal assist from pt noted. Seated in w/c, pt took medication, assisted with eating breakfast. Pt able to demo fork in B hands and bring to mouth however poor control and unable to effectively perform with food on fork. BP prior to standing frame 122/97. Performed standing frame x3 min; BP unable to assess in standing d/t equipment malfunction and pt beginning to report light headedness. Returned to sitting in w/c. Twice during session when LEs unsupported in w/c pt demo's extensor tone in BLEs/trunk and required assist to reposition and prevent sliding out of chair. Returned to bed as above. Placed on bedpan totalA; RN alerted to pt position and attempting to void.      Therapy Documentation Precautions:  Precautions Precautions: Fall, Cervical Required Braces or Orthoses: Cervical Brace Cervical Brace: Hard collar, Other (comment)(Don/doff in sitting. Ok to doff  in bed, when ambulating to bathroom, and when showering per MD order) Restrictions Weight Bearing Restrictions: No Pain: Pain Assessment Pain Scale: 0-10 Pain Score: 0-No pain   See Function Navigator for Current Functional Status.   Therapy/Group: Individual Therapy  Harlon Dittylizabeth J Jaiyana Canale 01/28/2018, 11:03 AM

## 2018-01-28 NOTE — Progress Notes (Signed)
Patient BP 164/104, MD notified no new order given. We continue to monitor.
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
## 2018-01-28 NOTE — Progress Notes (Addendum)
Occupational Therapy Session Note  Patient Details  Name: Brandon [name] MRN: [id number] Date of Birth: 1941-02-14  Today's Date: 01/28/2018 OT Individual Time: 1400-1500 OT Individual Time Calculation (min): 60 min    Short Term Goals: Week 1:  OT Short Term Goal 1 (Week 1): Pt will initiate washing 1 body area during ADL session  OT Short Term Goal 2 (Week 1): Pt will complete UB dressing EOB with 1 helper to improve balance OT Short Term Goal 3 (Week 1): Pt will complete 1 grooming task with AE as needed and Mod A  Skilled tx 1;1. Pt pleasantly confused this date with no c/o pain. OT dons pants total A at bed level rolling B with MAX A. Pt supine>sitting with log roll technique with MAX A and Vc for sequencing. Pt slide board transfer EOB<>TIS with total A of 2 with pt able to initate forward flexion with cuing. Pt completes towel glides seated in with min A for full ROM 1 min intervals for BUE strengthening  Without gravity (forward/back, horizontal, circle in B directions, int/ext rotation, elbow flex/ext). Pt completes brushing teeth with U cuff with MOD A for coordination of brushing strokes. Pt returned to bed, call light in reach and all needs met  Therapy Documentation Precautions:  Precautions Precautions: Fall, Cervical Required Braces or Orthoses: Cervical Brace Cervical Brace: Hard collar, Other (comment)(Don/doff in sitting. Ok to doff in bed, when ambulating to bathroom, and when showering per MD order) Restrictions Weight Bearing Restrictions: No  See Function Navigator for Current Functional Status.   Therapy/Group: Individual Therapy  Tonny Branch 01/28/2018, 3:02 PM

## 2018-01-28 NOTE — Plan of Care (Signed)
Continues to show improvement daily. Completing more sentences appropriately. Confusion still present but able to follow conversations better than a few days ago. Difficulty urinating  on own remains present. In and out cath continues for retention up to 500 cc.
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
## 2018-01-29 ENCOUNTER — Inpatient Hospital Stay (HOSPITAL_COMMUNITY): Payer: Medicare Other | Admitting: Occupational Therapy

## 2018-01-29 ENCOUNTER — Inpatient Hospital Stay (HOSPITAL_COMMUNITY): Payer: Medicare Other | Admitting: Physical Therapy

## 2018-01-29 ENCOUNTER — Inpatient Hospital Stay (HOSPITAL_COMMUNITY): Payer: Medicare Other | Admitting: Speech Pathology

## 2018-01-29 ENCOUNTER — Inpatient Hospital Stay (HOSPITAL_COMMUNITY): Payer: Medicare Other

## 2018-01-29 LAB — CBC
HEMATOCRIT: 37.3 % — AB (ref 39.0–52.0)
HEMOGLOBIN: 12.4 g/dL — AB (ref 13.0–17.0)
MCH: 32.2 pg (ref 26.0–34.0)
MCHC: 33.2 g/dL (ref 30.0–36.0)
MCV: 96.9 fL (ref 78.0–100.0)
Platelets: 298 10*3/uL (ref 150–400)
RBC: 3.85 MIL/uL — AB (ref 4.22–5.81)
RDW: 12.5 % (ref 11.5–15.5)
WBC: 10.2 10*3/uL (ref 4.0–10.5)

## 2018-01-29 LAB — BASIC METABOLIC PANEL
ANION GAP: 8 (ref 5–15)
BUN: 19 mg/dL (ref 8–23)
CHLORIDE: 103 mmol/L (ref 98–111)
CO2: 24 mmol/L (ref 22–32)
CREATININE: 0.72 mg/dL (ref 0.61–1.24)
Calcium: 8.3 mg/dL — ABNORMAL LOW (ref 8.9–10.3)
GFR calc non Af Amer: >60 mL/min (ref >60)
Glucose, Bld: 121 mg/dL — ABNORMAL HIGH (ref 70–99)
POTASSIUM: 3.8 mmol/L (ref 3.5–5.1)
Sodium: 135 mmol/L (ref 135–145)

## 2018-01-29 LAB — FOLATE: FOLATE: 19.4 ng/mL (ref >5.9)

## 2018-01-29 LAB — PROTIME-INR
INR: 2.23
Prothrombin Time: 24.5 seconds — ABNORMAL HIGH (ref 11.4–15.2)

## 2018-01-29 MED ORDER — WARFARIN SODIUM 2.5 MG PO TABS
2.5000 mg | ORAL_TABLET | Freq: Once | ORAL | Status: AC
  Administered 2018-01-29: 2.5 mg via ORAL
  Filled 2018-01-29: qty 1

## 2018-01-29 NOTE — Progress Notes (Signed)
Occupational Therapy Weekly Progress Note  Patient Details  Name: Brandon [name] MRN: [id number] Date of Birth: 01-Sep-1940  Beginning of progress report period: 01/20/18 End of progress report period: 01/29/18  Today's Date: 01/29/2018 OT Individual Time: 1001-1057 OT Individual Time Calculation (min): 56 min   Patient has met 1 of 3 short term goals.    Pt has made slow progress at time of report. He continues to required +2 assist for BADLs bedlevel and functional slideboard transfers. His fluctuating mentation limits progress. Adaptive equipment has been introduced during grooming tasks. Family has also been present during sessions for SCI education. Continue POC.   Patient continues to demonstrate the following deficits: muscle weakness, muscle joint tightness and muscle paralysis, decreased cardiorespiratoy endurance, abnormal tone, unbalanced muscle activation, motor apraxia, decreased coordination and decreased motor planning, decreased midline orientation, decreased motor planning and ideational apraxia, decreased initiation, decreased attention, decreased awareness, decreased problem solving, decreased safety awareness and decreased memory and decreased sitting balance, decreased standing balance and decreased postural control and therefore will continue to benefit from skilled OT intervention to enhance overall performance with BADL.  Patient progressing toward long term goals..  Continue plan of care.  OT Short Term Goals Week 1:  OT Short Term Goal 1 (Week 1): Pt will initiate washing 1 body area during ADL session  OT Short Term Goal 1 - Progress (Week 1): Met OT Short Term Goal 2 (Week 1): Pt will complete UB dressing EOB with 1 helper to improve balance OT Short Term Goal 2 - Progress (Week 1): Progressing toward goal OT Short Term Goal 3 (Week 1): Pt will complete 1 grooming task with AE as needed and Mod A OT Short Term Goal 3 - Progress (Week 1): Progressing toward goal Week  2:  OT Short Term Goal 1 (Week 2): Pt will initiate washing 2 body areas during ADL session  OT Short Term Goal 2 (Week 2): Pt will scan for 1 ADL item with mod vcs during grooming tasks OT Short Term Goal 3 (Week 2): Pt will complete BSC transfer with 2 assist  Skilled Therapeutic Interventions/Progress Updates:    Pt greeted supine in bed w/o s/s pain. Already dressed and per RN staff, brief recently changed. +2 for supine<sit and pt requiring Max A of 1 to maintain static sitting balance due to posterior LOBs. +2 for slideboard<w/c with pt able flex trunk with cuing. For remainder of session, worked [name], initiation, attention, visual scanning, and awareness during engagement in various grooming tasks. Unable to consistently scan for ADL items when placed Lt<Rt visual fields (and pt was wearing his glasses). Though he was able to read labels of such items, pt unable to interpret their meaning (I.e. Reading "shaving cream" but when asked if this was toothpaste or shaving cream, said it was "both."). Pt able to bring B hands to face to apply shaving cream. 2nd helper present for keeping his neck in neutral alignment with c-collar removed. Trialed built up toothbrush during oral care with pt able to complete brushing movements with min facilitation and HOH for grasp. Proximal musculature activated in bilateral UEs during hair brushing Lt>Rt sides. Pt required mod vcs for increasing alertness/keeping eyes open. At times, he exhibited Rt gaze preference. At end of session pt was reclined in TIS with safety belt fastened and fiance present. Advised her to roll pt up/down hallway and in dayroom in [place] unit to increase stimulation/alertness. She verbalized understanding. At end of session he was left  with fiance. SLP made aware of his position.   Therapy Documentation Precautions:  Precautions Precautions: Fall, Cervical Required Braces or Orthoses: Cervical Brace Cervical Brace: Hard collar, Other  (comment)(Don/doff in sitting. Ok to doff in bed, when ambulating to bathroom, and when showering per MD order) Restrictions Weight Bearing Restrictions: No Pain: No s/s pain during tx    ADL: ADL ADL Comments: Please see functional navigator for ADL status     See Function Navigator for Current Functional Status.   Therapy/Group: Individual Therapy  Ankush Gintz A Yehudis Monceaux 01/29/2018, 12:19 PM
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
## 2018-01-29 NOTE — Progress Notes (Signed)
Speech Language Pathology Daily Session Note  Patient Details  Name: Brandon [name] MRN: [id number] Date of Birth: 01/24/1941  Today's Date: 01/29/2018 SLP Individual Time: 1000-1100 SLP Individual Time Calculation (min): 60 min  Short Term Goals: Week 2: SLP Short Term Goal 1 (Week 2): Pt will consume therapeutic trials of Dysphagia III diet textures with timely oral phase + adequate oral clearance and without s/sx of aspiration across 3 consecutive sessions. SLP Short Term Goal 2 (Week 2): Pt will sustain attention to therapeutic tasks and/or conversation for ~15 minutes with less than 2 cues for redirection given Mod A cues. SLP Short Term Goal 3 (Week 2): Pt will complete functional, basic problem-solving tasks with Mod A cues.  SLP Short Term Goal 4 (Week 2): Pt will utilize external aids to recall functional information and daily events given Max A cues. SLP Short Term Goal 5 (Week 2): Pt will utilize speech intelligibility strategies to improve speech clarity/intelligibility to ~90% at the conversational level given Mod A cues.  Skilled Therapeutic Interventions:Skilled ST services focused on cognitive skills and family education. SLP facilitated orientation and basic problem solving utilizing pictures/written aids, pt required max A verbal cues, however limited by pain. SLP communicated with family, daughter and fiance, about limited progress made with continued prevalent but intermittent language of confusion and confabulations. Pt's family agrees and questions what are continued causing factors impeding improvement in cognition. Pt requested to get in bed. Nursing staff assisted pt in bed, in which sustained attention (5 minutes verse 3 minutes with max A verbal cues for redirection) and problem solving slightly improved (max-mod A verbal cues verse max A verbal cues), likely with the reduction of pain. Pt was left in room with call bell within reach and bed alaram set.ST reccomends to  continue skilled ST services.     Function:  Eating Eating   Modified Consistency Diet: Yes Eating Assist Level: Helper feeds patient           Cognition Comprehension Comprehension assist level: Understands basic 25 - 49% of the time/ requires cueing 50 - 75% of the time  Expression   Expression assist level: Expresses basic 25 - 49% of the time/requires cueing 50 - 75% of the time. Uses single words/gestures.;Expresses basic 50 - 74% of the time/requires cueing 25 - 49% of the time. Needs to repeat parts of sentences.  Social Interaction Social Interaction assist level: Interacts appropriately less than 25% of the time. May be withdrawn or combative.  Problem Solving Problem solving assist level: Solves basic 25 - 49% of the time - needs direction more than half the time to initiate, plan or complete simple activities  Memory Memory assist level: Recognizes or recalls 25 - 49% of the time/requires cueing 50 - 75% of the time;Recognizes or recalls less than 25% of the time/requires cueing greater than 75% of the time    Pain Pain Assessment Pain Scale: Faces Pain Score: Asleep Faces Pain Scale: Hurts even more Pain Type: Acute pain Pain Location: Neck Pain Intervention(s): RN made aware;Heat applied;Repositioned;Rest  Therapy/Group: Individual Therapy  Khian Remo  [facility] 01/29/2018, 4:22 PM

## 2018-01-29 NOTE — Progress Notes (Signed)
Orthopedic Tech Progress Note Patient Details:  Brandon [name] [birth year] [id number]  Ortho Devices Type of Ortho Device: Abdominal binder Ortho Device/Splint Interventions: Freeman CaldronOrdered       [name] 01/29/2018, 12:36 PM

## 2018-01-29 NOTE — Progress Notes (Signed)
Speech Language Pathology Daily Session Note  Patient Details  Name: Laray AngerMichael Kestler MRN: [id number] Date of Birth: January 26, 1941  Today's Date: 01/29/2018 SLP Individual Time: 0725-0755 SLP Individual Time Calculation (min): 30 min  Short Term Goals: Week 2: SLP Short Term Goal 1 (Week 2): Pt will consume therapeutic trials of Dysphagia III diet textures with timely oral phase + adequate oral clearance and without s/sx of aspiration across 3 consecutive sessions. SLP Short Term Goal 2 (Week 2): Pt will sustain attention to therapeutic tasks and/or conversation for ~15 minutes with less than 2 cues for redirection given Mod A cues. SLP Short Term Goal 3 (Week 2): Pt will complete functional, basic problem-solving tasks with Mod A cues.  SLP Short Term Goal 4 (Week 2): Pt will utilize external aids to recall functional information and daily events given Max A cues. SLP Short Term Goal 5 (Week 2): Pt will utilize speech intelligibility strategies to improve speech clarity/intelligibility to ~90% at the conversational level given Mod A cues.  Skilled Therapeutic Interventions: Skilled treatment session focused on cognitive and dysphagia goals. SLP facilitated session by providing extra time and Max A multimodal cues for arousal and attention throughout session due to fatigue. Patient could maintain arousal for ~60 second increments with intermittent language of confusion noted. Patient also required constant repositioning due to reports of feeling "weak." RN present and took vitals with BP 88/59. Patient consumed Dys. 1 textures (oatmeal) and thin liquids without overt s/s of aspiration but continues to demonstrate minimal PO intake. Patient left upright in bed with RN present. Continue with current plan of care.      Function:  Eating Eating   Modified Consistency Diet: Yes Eating Assist Level: Helper feeds patient           Cognition Comprehension Comprehension assist level: Understands basic  25 - 49% of the time/ requires cueing 50 - 75% of the time  Expression   Expression assist level: Expresses basic 25 - 49% of the time/requires cueing 50 - 75% of the time. Uses single words/gestures.  Social Interaction Social Interaction assist level: Interacts appropriately less than 25% of the time. May be withdrawn or combative.  Problem Solving Problem solving assist level: Solves basic less than 25% of the time - needs direction nearly all the time or does not effectively solve problems and may need a restraint for safety  Memory Memory assist level: Recognizes or recalls less than 25% of the time/requires cueing greater than 75% of the time    Pain No/Denies Pain   Therapy/Group: Individual Therapy  Landrie Beale 01/29/2018, 3:35 PM

## 2018-01-29 NOTE — Progress Notes (Addendum)
Physical Therapy Session Note  Patient Details  Name: Brandon [name] MRN: [id number] Date of Birth: 1941/02/02  Today's Date: 01/29/2018 PT Individual Time: 1300-1415 PT Individual Time Calculation (min): 75 min   Short Term Goals: Week 2:  PT Short Term Goal 1 (Week 2): Pt will maintain static sitting balance with mod A PT Short Term Goal 2 (Week 2): Pt will perform sliding board transfer with assist x 1 consistently PT Short Term Goal 3 (Week 2): Pt will initiate standing with LRAD to work on standing tolerance  Skilled Therapeutic Interventions/Progress Updates:    no c/o pain at rest, but grimacing by end of session reporting pain in upper trap region.  PT applied warm pack to shoulders at end of session which pt tolerated well.  Session focus on bed mobility, transfers, and upright tolerance.   PT provided PROM stretching to BLEs x30-60 seconds each for heel cords, hamstrings, glutes, IR/ER, and hip adduction.  Blocked practice for rolling L/R with UE hooking on bed rails progress from mod assist to min assist with max multimodal cues for technique and sequencing.  Supine>sit with total assist to bring LEs off EOB and to bring trunk upright from flat bed.  Slide board transfer to w/c with +2 assist and total assist to reposition in chair.  Standing frame x1 minute for upright tolerance and weight bearing, BP assessed throughout (see below).  Pt able to tolerate up to 1 minute in standing frame with max assist to position UEs in weight bearing position and to keep chest lifted before returning to sit 2/2 orthostasis (though pt reports no symptoms).  Seated activity focus on forward weight shift and return to upright pushing ball out on table and returning upright with max assist.  Pt beginning to grimace and confirmed pain in shoulders, so PT applied hot pack.  Returned to room at end of session and positioned tilted back in w/c with call bell in reach and needs met.   BP:  Sitting:  113/77 Standing: 102/65 Standing 1 min: 85/60 Sitting 1 min: 105/71  Therapy Documentation Precautions:  Precautions Precautions: Fall, Cervical Required Braces or Orthoses: Cervical Brace Cervical Brace: Hard collar, Other (comment)(Don/doff in sitting. Ok to doff in bed, when ambulating to bathroom, and when showering per MD order) Restrictions Weight Bearing Restrictions: No   See Function Navigator for Current Functional Status.   Therapy/Group: Individual Therapy  [name] 01/29/2018, 2:15 PM

## 2018-01-29 NOTE — Progress Notes (Signed)
Occupational Therapy Session Note  Patient Details  Name: Brandon [name] MRN: [id number] Date of Birth: Oct 25, 1940  Today's Date: 01/29/2018 OT Individual Time: 1191-47821448-1532 OT Individual Time Calculation (min): 44 min   Short Term Goals: Week 2:  OT Short Term Goal 1 (Week 2): Pt will initiate washing 2 body areas during ADL session  OT Short Term Goal 2 (Week 2): Pt will scan for 1 ADL item with mod vcs during grooming tasks OT Short Term Goal 3 (Week 2): Pt will complete BSC transfer with 2 assist  Skilled Therapeutic Interventions/Progress Updates:    Pt greeted in TIS with visitors present. No c/o pain. Took him to the dayroom to work on UE/LE weightbearing with standing frame. BP prior to stand 122/71. Pt placing arms across chest with cuing while he transitioned into standing. Bilateral forearm weightbearing facilitated on table with multimodal cues and manual facilitation required for trunk extension. Included visual scanning demands with identifying objects in colorful pictures. Pt with 5% accuracy. When returned to seated position, BP reassessed with reading of 59/50 and 97/65 respectively. Pt reported no dizziness however presented with increased somnolence post stand. Returned pt to room to wash face at sink. Pt verbalizing "I'm done with all this" and appearing sad. His lunch tray was there and he didn't want to eat. Provided him with emotional support and encouragement to increase feelings of volition and self efficacy. He was taken to RN station to be in social environment. Set up meaningful music listening in order to improve affect. He was left with RN at session exit.    Therapy Documentation Precautions:  Precautions Precautions: Fall, Cervical Required Braces or Orthoses: Cervical Brace Cervical Brace: Hard collar, Other (comment)(Don/doff in sitting. Ok to doff in bed, when ambulating to bathroom, and when showering per MD order) Restrictions Weight Bearing Restrictions:  No Vital Signs: Therapy Vitals Temp: 98 F (36.7 C) Temp Source: Oral Pulse Rate: 64 Resp: 16 BP: 111/76 Patient Position (if appropriate): Sitting Oxygen Therapy SpO2: 98 % O2 Device: Room Air Pain: Pain Assessment Pain Scale: Faces Pain Score: Asleep ADL: ADL ADL Comments: Please see functional navigator for ADL status :    See Function Navigator for Current Functional Status.   Therapy/Group: Individual Therapy  Brittani Purdum A Keyarra Rendall 01/29/2018, 3:56 PM
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
## 2018-01-30 ENCOUNTER — Inpatient Hospital Stay (HOSPITAL_COMMUNITY): Payer: Medicare Other | Admitting: Physical Therapy

## 2018-01-30 ENCOUNTER — Inpatient Hospital Stay (HOSPITAL_COMMUNITY): Payer: Medicare Other | Admitting: Occupational Therapy

## 2018-01-30 ENCOUNTER — Inpatient Hospital Stay (HOSPITAL_COMMUNITY): Payer: Medicare Other

## 2018-01-30 LAB — PROTIME-INR
INR: 2.18
PROTHROMBIN TIME: 24.1 s — AB (ref 11.4–15.2)

## 2018-01-30 MED ORDER — WARFARIN SODIUM 5 MG PO TABS
5.0000 mg | ORAL_TABLET | ORAL | Status: DC
  Administered 2018-01-30: 5 mg via ORAL
  Filled 2018-01-30: qty 1

## 2018-01-30 MED ORDER — WARFARIN SODIUM 2.5 MG PO TABS: 2.5000 mg | ORAL_TABLET | ORAL | Status: DC

## 2018-01-30 MED ORDER — BISACODYL 10 MG RE SUPP
10.0000 mg | Freq: Every day | RECTAL | Status: DC
  Administered 2018-01-31 – 2018-02-27 (×28): 10 mg via RECTAL
  Filled 2018-01-30 (×28): qty 1

## 2018-01-30 MED ORDER — MEGESTROL ACETATE 400 MG/10ML PO SUSP
400.0000 mg | Freq: Two times a day (BID) | ORAL | Status: DC
  Administered 2018-01-30 – 2018-02-27 (×53): 400 mg via ORAL
  Filled 2018-01-30 (×57): qty 10

## 2018-01-30 NOTE — Progress Notes (Signed)
Physical Therapy Session Note  Patient Details  Name: Brandon [name] MRN: [id number] Date of Birth: November 15, 1940  Today's Date: 01/30/2018 PT Individual Time: 1000-1045 PT Individual Time Calculation (min): 45 min   Short Term Goals: Week 2:  PT Short Term Goal 1 (Week 2): Pt will maintain static sitting balance with mod A PT Short Term Goal 2 (Week 2): Pt will perform sliding board transfer with assist x 1 consistently PT Short Term Goal 3 (Week 2): Pt will initiate standing with LRAD to work on standing tolerance  Skilled Therapeutic Interventions/Progress Updates:    Pt initially off unit for MRI, missed first 15 minutes of session.  Session focus on following 1-step commands, NMR for postural control, and transfers.    LB dressing at bed level with max assist for donning TEDs, pants, and socks.  Supine>sit with HOB elevated and max assist for LEs and trunk, pt able to hook RUE around therapist and assist with elevating trunk.  Slide board x3 throughout session, each time with total +2 assist, max multimodal cues for pt to push through LEs or UEs, but no effort perceived.  UB dressing in w/c with pt able to lift arms to sleeves but requiring assist for threading and pulling shirt over head/trunk.  On therapy mat focus on maintaining static sitting balance, pt ranges from min>max assist pending fatigue.  Able to use UEs for some support and max verbal/tactile cues for finding balance point.  Pt returned to nurses station at end of session, chair alarm intact, and needs met.   Therapy Documentation Precautions:  Precautions Precautions: Fall, Cervical Required Braces or Orthoses: Cervical Brace Cervical Brace: Hard collar, Other (comment)(Don/doff in sitting. Ok to doff in bed, when ambulating to bathroom, and when showering per MD order) Restrictions Weight Bearing Restrictions: No General: PT Amount of Missed Time (min): 15 Minutes PT Missed Treatment Reason: CT/MRI   See Function  Navigator for Current Functional Status.   Therapy/Group: Individual Therapy  [name] 01/30/2018, 10:44 AM
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
## 2018-01-30 NOTE — Progress Notes (Signed)
Team Conference Summary:  MRI of head reviewed by Dr. Riley KillSwartz, nothing new to suggest clear etiology for AMS (which is improving some).  Suppository added for routine bowel management since patient does not appear to be on any specific pattern.  Requiring intermittent catheterizations for urinary retention.  Pain controlled on scheduled tylenol.  Discussed BP fluctuations with MD and team.  Recommendations will be made by Dr. Riley KillSwartz if needed.  Poor appetite- megace supplement added.  Family education with fiance and her daughter and the patients daughter (POA) to begin next week.    Dani Gobbleeardon, Christean Silvestri J, RN

## 2018-01-30 NOTE — Plan of Care (Signed)
  Problem: RH SKIN INTEGRITY Goal: RH STG SKIN FREE OF INFECTION/BREAKDOWN Description Patients skin will remain free from further infection or breakdown with mod assist.  Outcome: Progressing Goal: RH STG MAINTAIN SKIN INTEGRITY WITH ASSISTANCE Description STG Maintain Skin Integrity With mod Assistance.  Outcome: Progressing Goal: RH STG ABLE TO PERFORM INCISION/WOUND CARE W/ASSISTANCE Description STG Able To Perform Incision/Wound Care With total Assistance from caregiver .  Outcome: Progressing   Problem: RH PAIN MANAGEMENT Goal: RH STG PAIN MANAGED AT OR BELOW PT'S PAIN GOAL Description < 4  Outcome: Progressing   Problem: Consults Goal: RH SPINAL CORD INJURY PATIENT EDUCATION Description  See Patient Education module for education specifics.  Outcome: Not Progressing Goal: Skin Care Protocol Initiated - if Braden Score 18 or less Description If consults are not indicated, leave blank or document N/A Outcome: Not Progressing   Problem: SCI BOWEL ELIMINATION Goal: RH STG MANAGE BOWEL WITH ASSISTANCE Description STG Manage Bowel with mod Assistance.  Outcome: Not Progressing Goal: RH STG SCI MANAGE BOWEL PROGRAM W/ASSIST OR AS APPROPRIATE Description STG SCI Manage bowel program w/mod assist or as appropriate.  Outcome: Not Progressing   Problem: SCI BLADDER ELIMINATION Goal: RH STG MANAGE BLADDER WITH ASSISTANCE Description STG Manage Bladder With mod Assistance  Outcome: Not Progressing Goal: RH STG MANAGE BLADDER WITH EQUIPMENT WITH ASSISTANCE Description STG Manage Bladder With Equipment With mod Assistance  Outcome: Not Progressing Goal: RH STG SCI MANAGE BLADDER PROGRAM W/ASSISTANCE Description Mod assist  Outcome: Not Progressing   Problem: RH SAFETY Goal: RH STG ADHERE TO SAFETY PRECAUTIONS W/ASSISTANCE/DEVICE Description STG Adhere to Safety Precautions With mod Assistance/Device.  Outcome: Not Progressing   Problem: RH KNOWLEDGE DEFICIT  SCI Goal: RH STG INCREASE KNOWLEDGE OF SELF CARE AFTER SCI Description Mod assist  Outcome: Not Progressing  Patient still with AMS.  Does not retain information taught.  Requires max/total assist for intermittent caths and starting 6am bowel program with suppository daily  Dani Gobbleeardon, Mavrick Mcquigg J, RN

## 2018-01-30 NOTE — Progress Notes (Signed)
Speech Language Pathology Daily Session Note  Patient Details  Name: Brandon [name] MRN: [id number] Date of Birth: 07-May-1941  Today's Date: 01/30/2018 SLP Individual Time: 7829-5621 SLP Individual Time Calculation (min): 64 min  Short Term Goals: Week 2: SLP Short Term Goal 1 (Week 2): Pt will consume therapeutic trials of Dysphagia III diet textures with timely oral phase + adequate oral clearance and without s/sx of aspiration across 3 consecutive sessions. SLP Short Term Goal 2 (Week 2): Pt will sustain attention to therapeutic tasks and/or conversation for ~15 minutes with less than 2 cues for redirection given Mod A cues. SLP Short Term Goal 3 (Week 2): Pt will complete functional, basic problem-solving tasks with Mod A cues.  SLP Short Term Goal 4 (Week 2): Pt will utilize external aids to recall functional information and daily events given Max A cues. SLP Short Term Goal 5 (Week 2): Pt will utilize speech intelligibility strategies to improve speech clarity/intelligibility to ~90% at the conversational level given Mod A cues.  Skilled Therapeutic Interventions:Skilled ST services focused on swallow and cognitive skills. Pt was in bed, alert and pleasant upon entering room. Pt recalled recent doctor visitatuion, with 5 minute delay given mod A verbal cues. SLP facilitated recall of orientation information given visual aid posted in room, pt recalled month, year without cuing consistently, while place and day of week min A verbal cues with 5 minute delay and mod A verbal cues with 10 minute delay. Pt demonstarted recall of doctor's name given supervision A questions cues following up to 50 minute delay. Pt demonstrated increase sustained attention in 10 minute intervals requiring mod A verbal cues, however continues with language of confusion and confabulation despite attempts to reason. Pt stated " I am able to do the things I did before, wash, shave, eat, and drive." SLP provided education on  current deficits, given return demonstration of impaired attempt to self feed, pt continued to denie deficits. SLP facilitated visual scanning and identified of objects, pt demonstrated inconsistent results ranging from 30% accuracy with max A verbal cues to 80% accuracy with min A verbal cues.SLP facilitated PO consumption of dys 2, only agreed to 1 bite and 1 whole graham crack to assess consumption of dys 3 diet. Pt demonstrated apporpirate clearance with dys 3, requesting liquid wash, however mild s/s aspiration, cough during mastication due to continued verbalization. SLP recommends to continue dys 3 trials, however upgrade of solid will conside with improved mentation. Pt demonstrated improved speech intelligible along with increase attention and longer periods of clear mentation, requiring mod-min A verbal cues at sentence level with 90% accuracy. Pt was left in room with call bell within reach and bed alaram set.ST reccomends to continue skilled ST services.     Function:  Eating Eating   Modified Consistency Diet: Yes(dys 3 trial ) Eating Assist Level: Hand over hand assist           Cognition Comprehension Comprehension assist level: Understands basic 50 - 74% of the time/ requires cueing 25 - 49% of the time  Expression   Expression assist level: Expresses basic 50 - 74% of the time/requires cueing 25 - 49% of the time. Needs to repeat parts of sentences.  Social Interaction Social Interaction assist level: Interacts appropriately 50 - 74% of the time - May be physically or verbally inappropriate.  Problem Solving Problem solving assist level: Solves basic 25 - 49% of the time - needs direction more than half the time to initiate, plan or  complete simple activities  Memory Memory assist level: Recognizes or recalls 50 - 74% of the time/requires cueing 25 - 49% of the time;Recognizes or recalls 25 - 49% of the time/requires cueing 50 - 75% of the time    Pain Pain Assessment Pain  Scale: Faces Pain Score: 0-No pain Faces Pain Scale: No hurt  Therapy/Group: Individual Therapy  Taejah Ohalloran  [facility] CenterCRATCH 01/30/2018, 9:56 AM
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
## 2018-01-30 NOTE — Progress Notes (Signed)
Occupational Therapy Session Note  Patient Details  Name: Brandon [name] MRN: [id number] Date of Birth: April 30, 1941  Today's Date: 01/30/2018 OT Individual Time: 1100-1200, 1220-1300 and 1425-1510 OT Individual Time Calculation (min):60 mins, 40 min, and 45 min    Short Term Goals: Week 2:  OT Short Term Goal 1 (Week 2): Pt will initiate washing 2 body areas during ADL session  OT Short Term Goal 2 (Week 2): Pt will scan for 1 ADL item with mod vcs during grooming tasks OT Short Term Goal 3 (Week 2): Pt will complete BSC transfer with 2 assist  Skilled Therapeutic Interventions/Progress Updates:    Session 1: Pt received at RN station with focus on upright positioning while monitoring BP, alertness, and cognition. Pt with eyes closed multiple times during session secondary to fatigue but otherwise was engaged. Pt transferred onto tilt table to maxi move +2 for safety. Pt has on abdominal binder, TEDs, and wraps applied to B LEs to address BP concerns. Pt tilted from supine>25 degrees with BP of 144/71. Pt adjusted to 45 degrees with immediate BP of 132/80. After 10 minutes, BP taken again with results of 99/69 and pt asymptomatic. While tilted up at 45 degrees pt engaged in headband game where he must describe word found on headband of other player so they can guess. Pt required mod multimodal cues and increased time to attend to task and problem solve. Pt verbalizing, " Ok, I'm done now." He was then returned to supine as assisted back into wheelchair in same manner. Pt returned to RN station and repositioned for comfort. All needs within reach.   Session 2:PT received at RN station and agreeable to OT intervention. Pt with c/o "tighness" in B shoulders this session. RN aware. OT applying heat for pain relief with pt verbalizing, "it feels better." Pt seated in wheelchair with B elbows propped on high low table for comfort and to decrease pain. Universal cuff donned to L UE and pt feeding self  multiple bites of food with encouragement and rest breaks. OT returned to sit at RN station for safety. Pt fatigued and falling asleep as pt left at station. All needs within reach.   Session 3: Pt received at RN station seated in wheelchair with increased fatigue. OT assisted pt back to room via wheelchair and transferred pt to bed with total A +2 slide board transfer. Pt was able to perform anterior weight shift with mod A and hook UEs onto therapist for transfer. Pt required total A for sit >supine. Pt rolling L <> R with max A and hooking UE onto bed rail with blocked practice. Pt's fiance arrives to room and pt giving accurate description of activities he has done with this therapist during today's sessions with verbal guidance cues. Pt repositioned in bed for comfort with soft call bell within reach.  Therapy Documentation Precautions:  Precautions Precautions: Fall, Cervical Required Braces or Orthoses: Cervical Brace Cervical Brace: Hard collar, Other (comment)(Don/doff in sitting. Ok to doff in bed, when ambulating to bathroom, and when showering per MD order) Restrictions Weight Bearing Restrictions: No General:   Vital Signs: Therapy Vitals Temp: 97.8 F (36.6 C) Temp Source: Oral Pulse Rate: (!) 54 Resp: 18 BP: (!) 150/72 Patient Position (if appropriate): Lying Oxygen Therapy SpO2: 99 % O2 Device: Room Air Pain:   ADL: ADL ADL Comments: Please see functional navigator for ADL status  See Function Navigator for Current Functional Status.   Therapy/Group: Individual Therapy  Alen BleacherBradsher, Malaisha Silliman P 01/30/2018,  4:16 PM

## 2018-01-31 ENCOUNTER — Inpatient Hospital Stay (HOSPITAL_COMMUNITY): Payer: Medicare Other

## 2018-01-31 ENCOUNTER — Inpatient Hospital Stay (HOSPITAL_COMMUNITY): Payer: Medicare Other | Admitting: Physical Therapy

## 2018-01-31 ENCOUNTER — Inpatient Hospital Stay (HOSPITAL_COMMUNITY): Payer: Medicare Other | Admitting: Occupational Therapy

## 2018-01-31 LAB — PROTIME-INR
INR: 2.71
Prothrombin Time: 28.6 seconds — ABNORMAL HIGH (ref 11.4–15.2)

## 2018-01-31 LAB — VITAMIN B1: VITAMIN B1 (THIAMINE): 233 nmol/L — AB (ref 66.5–200.0)

## 2018-01-31 MED ORDER — WARFARIN SODIUM 1 MG PO TABS
1.0000 mg | ORAL_TABLET | Freq: Once | ORAL | Status: AC
  Administered 2018-01-31: 1 mg via ORAL
  Filled 2018-01-31: qty 1
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
## 2018-01-31 NOTE — Progress Notes (Signed)
Physical Therapy Session Note  Patient Details  Name: Brandon [name] MRN: [id number] Date of Birth: [birth year]  Today's Date: 01/31/2018 PT Individual Time: 1120-1200 AND 1500-1530  PT Individual Time Calculation (min): 40 min 30 min   Short Term Goals: Week 2:  PT Short Term Goal 1 (Week 2): Pt will maintain static sitting balance with mod A PT Short Term Goal 2 (Week 2): Pt will perform sliding board transfer with assist x 1 consistently PT Short Term Goal 3 (Week 2): Pt will initiate standing with LRAD to work on standing tolerance  Skilled Therapeutic Interventions/Progress Updates:  Session 1.  Bed mobility to roll to the R with mod assist and total assist for supine>sit. Sitting balance EOB x 2 min to prepare for SB transfers. Lateral lean to the R with max assist to anterior LOB. SB Transfer to [facility] with total assist +2. Pt noted to not provide assist or initiate movement through LE to reduce friction on gluteal surface.   BP assessed sitting in WC prior to attempted use of standing frame 87/76. After sitting upright x 4 min PT reassessed BP 95/73. PT deferred standing frame due low BP compared to previous assessments by RN.   Seated NMR for BLE strength and coordination. AAROM for LAQ, hip flexion. Hip extension, hip abduction. All performed x 8 with prolonged stretch   Patient returned to RN station and left sitting in [facility] with all needs met.     Session 2.  Pt received supine in bed and agreeable to PT. Supine>sit transfer with total assist. Sitting balance EOB to perform lateral/anterior reaches 3x 5 BUE. Max assist to prevent LOB in all directions with poor righting reactions with and without cues to attend to LOB. Sit>supine completed with total assist +2 and left supine in bed with call bell in reach and all needs met.          Therapy Documentation Precautions:  Precautions Precautions: Fall, Cervical Required Braces or Orthoses: Cervical Brace Cervical Brace: Hard  collar, Other (comment)(Don/doff in sitting. Ok to doff in bed, when ambulating to bathroom, and when showering per MD order) Restrictions Weight Bearing Restrictions: No Vital Signs: Therapy Vitals Pulse Rate: 62 BP: 95/73 Pain: Pain Assessment Pain Scale: Faces Faces Pain Scale: Hurts little more Pain Type: Acute pain Pain Location: Back Pain Orientation: Right;Left;Upper Pain Descriptors / Indicators: Aching Pain Intervention(s): Repositioned;Heat applied   See Function Navigator for Current Functional Status.   Therapy/Group: Individual Therapy  Lorie Phenix 01/31/2018, 12:20 PM

## 2018-01-31 NOTE — Progress Notes (Signed)
Physical Therapy Session Note  Patient Details  Name: Brandon [name] MRN: [id number] Date of Birth: April 06, 1941  Today's Date: 01/31/2018 PT Individual Time: 2330-0762 PT Individual Time Calculation (min): 75 min   Short Term Goals: Week 2:  PT Short Term Goal 1 (Week 2): Pt will maintain static sitting balance with mod A PT Short Term Goal 2 (Week 2): Pt will perform sliding board transfer with assist x 1 consistently PT Short Term Goal 3 (Week 2): Pt will initiate standing with LRAD to work on standing tolerance  Skilled Therapeutic Interventions/Progress Updates:    no c/o pain at rest but does grimace in pain throughout session and indicates some discomfort in shoulders.  Heat applied throughout session and pain monitored.  Session focus on arousal, following 1-step commands, balance, UE GMC, and transfers.   Pt requires mod cues to maintain arousal throughout session, occasionally falling asleep and requiring tactile/verbal cues to stay awake.  UE [place] task sorting bean bags with 100% accuracy for color matching, but max assist needing for UE coordination.  PT assisted RN with med pass providing mod multimodal cues for arousal and cues for recall of medications given.  Transfer w/c<>therapy mat with slide board and +2 assist, no effort perceived from pt despite cues for "lifting bottom".  Sitting balance focus on alternating UE cone taps with mod assist for balance.  Transition to supine with +2 assist.  PT performs PROM stretching for BLEs 3x30 seconds for heel cords, hamstrings, glutes, and hip flexors.  Pt with spasticity in BLEs, especially with hamstring stretch, RLE>LLE.  Pt returned to sitting with max assist +1 for LEs and to elevate trunk.  End of session pt returned to bed with +2 for slide board transfer and sit>supine.  Pt positioned to comfort with call bell in reach and needs met.   Therapy Documentation Precautions:  Precautions Precautions: Fall, Cervical Required Braces or  Orthoses: Cervical Brace Cervical Brace: Hard collar, Other (comment)(Don/doff in sitting. Ok to doff in bed, when ambulating to bathroom, and when showering per MD order) Restrictions Weight Bearing Restrictions: No   See Function Navigator for Current Functional Status.   Therapy/Group: Individual Therapy  [name] 01/31/2018, 9:46 AM

## 2018-01-31 NOTE — Patient Care Conference (Signed)
Inpatient RehabilitationTeam Conference and Plan of Care Update Date: 01/30/2018   Time: 2:10 PM    Patient Name: Brandon AngerMichael [name]      Medical Record Number: [id number]  Date of Birth: 08/10/1940 Sex: Male         Room/Bed: 4W11C/4W11C-01 Payor Info: Payor: MEDICARE / Plan: MEDICARE PART A AND B / Product Type: *No Product type* /    Admitting Diagnosis: Trauma SCi Mild TBI  Admit Date/Time:  01/19/2018  4:39 PM Admission Comments: No comment available   Primary Diagnosis:  <principal problem not specified> Principal Problem: <principal problem not specified>  Patient Active Problem List   Diagnosis Date Noted  . Acute lower UTI   . Dysphagia   . ETOH abuse   . Neurogenic bowel   . Neurogenic bladder   . Essential hypertension   . PAF (paroxysmal atrial fibrillation) (HCC)   . Postoperative pain   . 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 Injury of cervical spine (HCC) 01/15/2018    Expected Discharge Date: Expected Discharge Date: (3 weeks)  Team Members Present: Physician leading conference: Dr. Faith RogueZachary Swartz Social Worker Present: Amada JupiterLucy Katelin Kutsch, LCSW Nurse Present: Ronny BaconWhitney Reardon, RN PT Present: Teodoro Kilaitlin Penven-Crew, PT OT Present: Callie FieldingKatie Pittman, OT SLP Present: Colin BentonMadison Cratch, SLP PPS Coordinator present : Tora DuckMarie Noel, RN, CRRN     Current Status/Progress Goal Weekly Team Focus  Medical   ongoing confusion but improved arousal. TBI is most likely cause. treated for presumptive UTI  improve cognition and functional mobility  work up for AMS, nutrition, pain control, SCI considerations   Bowel/Bladder   Total assist with bowel/bladder, I&O cath q 8hrs LBM  To be able to have normal bowel/bladder functions with mod assist  continue to monitor bowel/bladder function q shift and as neeeded   Swallow/Nutrition/ Hydration   dys 2 and thin, max-mod A  Min   dys 3 trials, lim ited by cognition    ADL's   Total A of 2 with self care + slideboard transfers   mod A overall   Trunk control, cognition, NMR, functional mobility, pt/family education    Mobility   still total +2 for transfers, progressing with rolling in bed, static sitting balance, and forward weight shift  Mod A for transfers with LRAD, supervision for power chair   sitting balance, slide board transfers, w/c mobility pending improvement in cognition/awareness   Communication   sentence max-mod A, inconsistent   Min A conversation  intelligibility strategies, biofeedback   Safety/Cognition/ Behavioral Observations  max-mod A   Mod A, Min A  sustained attention, orienattaion, problem solving, recall    Pain   Complians of headache, Tylenol PRN  <2  assess and treat pain q shift and as needed   Skin   Surgical incision to the neck with foam dsg, aspen collar all the time, redness to the butt, barrere cream  maintain skin integrity with mod assist  Assess skin q shift and as needed      *See Care Plan and progress notes for long and short-term goals.     Barriers to Discharge  Current Status/Progress Possible Resolutions Date Resolved   Physician    Medical stability;Neurogenic Bowel & Bladder        ongoing mgt of bowel and bladder considerations      Nursing                  PT  OT Medical stability;Home environment access/layout;Incontinence;Neurogenic Bowel & Bladder;Behavior;Nutrition means                SLP                SW                Discharge Planning/Teaching Needs:  Anticipate d/c plan to be SNF due to goals and daughter's wishes to pursue SNF in order to continue with daily therapies.    Team Discussion:  Still with significant confusion;  MRI today.  BP fluctuates even with abdominal binder.  Poor recall.  While still significant cognitive impairment, txs do feel that he is showing improvement.  OT teaching use of AE.  caths q 6-8 hrs and working on a bowel program.   Revisions to Treatment Plan:  Pending MRI report;  Doubt any change in tx plan.    Continued Need for Acute Rehabilitation Level of Care: The patient requires daily medical management by a physician with specialized training in physical medicine and rehabilitation for the following conditions: Daily direction of a multidisciplinary physical rehabilitation program to ensure safe treatment while eliciting the highest outcome that is of practical value to the patient.: Yes Daily medical management of patient stability for increased activity during participation in an intensive rehabilitation regime.: Yes Daily analysis of laboratory values and/or radiology reports with any subsequent need for medication adjustment of medical intervention for : Post surgical problems;Neurological problems   I attest that I was present, lead the team conference, and concur with the assessment and plan of the team.   Presly Steinruck 01/31/2018, 10:27 AM

## 2018-01-31 NOTE — Progress Notes (Signed)
Speech Language Pathology Daily Session Note  Patient Details  Name: Brandon [name] Brandon [name]: [id number] Date of Birth: 20-Sep-1940  Today's Date: 01/31/2018 SLP Individual Time: 1330-1400 SLP Individual Time Calculation (min): 30 min  Short Term Goals: Week 2: SLP Short Term Goal 1 (Week 2): Pt will consume therapeutic trials of Dysphagia III diet textures with timely oral phase + adequate oral clearance and without s/sx of aspiration across 3 consecutive sessions. SLP Short Term Goal 2 (Week 2): Pt will sustain attention to therapeutic tasks and/or conversation for ~15 minutes with less than 2 cues for redirection given Mod A cues. SLP Short Term Goal 3 (Week 2): Pt will complete functional, basic problem-solving tasks with Mod A cues.  SLP Short Term Goal 4 (Week 2): Pt will utilize external aids to recall functional information and daily events given Max A cues. SLP Short Term Goal 5 (Week 2): Pt will utilize speech intelligibility strategies to improve speech clarity/intelligibility to ~90% at the conversational level given Mod A cues.  Skilled Therapeutic Interventions:Skilled ST services focused on cognitive skills. Pt asleep behind nurse's desk, required max A verbal cues for arsoual. SLP facilitated orientation of place an day of week within familiar setting, pt's room, pt required max-min A verbal cues to recall utilizing visual aids, likely further impaired by reduced sustained attention requiring max A verbal cues in 1-2 minute intervals. SLP communicated with nursing staff to allow pt to return to bed before afternoon session with pt, nursing in agreement. SLP assisted nurse in transfer to bed. Pt was left in room with call bell within reach and bed alaram set.ST reccomends to continue skilled ST services.     Function:  Eating Eating   Modified Consistency Diet: Yes Eating Assist Level: Assistive Device;Helper scoops food on utensil;Helper brings food to mouth     Helper Scoops Food  on Utensil: Every scoop Helper Brings Food to Mouth: Every scoop   Cognition Comprehension Comprehension assist level: Understands basic 50 - 74% of the time/ requires cueing 25 - 49% of the time  Expression   Expression assist level: Expresses basic 50 - 74% of the time/requires cueing 25 - 49% of the time. Needs to repeat parts of sentences.  Social Interaction Social Interaction assist level: Interacts appropriately 50 - 74% of the time - May be physically or verbally inappropriate.  Problem Solving Problem solving assist level: Solves basic 25 - 49% of the time - needs direction more than half the time to initiate, plan or complete simple activities  Memory Memory assist level: Recognizes or recalls 25 - 49% of the time/requires cueing 50 - 75% of the time;Recognizes or recalls 50 - 74% of the time/requires cueing 25 - 49% of the time;Recognizes or recalls 75 - 89% of the time/requires cueing 10 - 24% of the time    Pain Pain Assessment Pain Score: 0-No pain  Therapy/Group: Individual Therapy  Brandon [name]  Brandon [name] 01/31/2018, 4:07 PM

## 2018-01-31 NOTE — Progress Notes (Signed)
Occupational Therapy Session Note  Patient Details  Name: Brandon [name] MRN: [id number] Date of Birth: Dec 08, 1940  Today's Date: 01/31/2018 OT Individual Time: [id number] OT Individual Time Calculation (min): 71 min    Short Term Goals: Week 2:  OT Short Term Goal 1 (Week 2): Pt will initiate washing 2 body areas during ADL session  OT Short Term Goal 2 (Week 2): Pt will scan for 1 ADL item with mod vcs during grooming tasks OT Short Term Goal 3 (Week 2): Pt will complete BSC transfer with 2 assist  Skilled Therapeutic Interventions/Progress Updates:    Upon entering the room, pt supine in bed but alert and remembers therapist from yesterday. Pt oriented to self and month. OT provided total A to don B TEDs, B ACE wraps, and pants. Pt rolling L <> R with max A and hooking UE around bed rails. Supine >sit with total A to EOB and transferred into wheelchair with slide board and +2 total A for safety. Pt able to discussed leisure interest of reading horror books. He correctly names authors, books, and plots within this genre. Universal cuff placed on L UE with elbows propped on high low table. Pt needing assistance to scoop food onto utensil and occasionally needing assistance to bring to mouth. Pt very fatigued but does not complain of pain. Pt tilted for pressure relief and then returned to RN station for safety and to increase alertness.   Therapy Documentation Precautions:  Precautions Precautions: Fall, Cervical Required Braces or Orthoses: Cervical Brace Cervical Brace: Hard collar, Other (comment)(Don/doff in sitting. Ok to doff in bed, when ambulating to bathroom, and when showering per MD order) Restrictions Weight Bearing Restrictions: No General:   Vital Signs: Therapy Vitals Pulse Rate: 62 BP: 95/73 Pain: Pain Assessment Pain Scale: Faces Faces Pain Scale: Hurts little more Pain Type: Acute pain Pain Location: Back Pain Orientation: Right;Left;Upper Pain Descriptors /  Indicators: Aching Pain Intervention(s): Repositioned;Heat applied ADL: ADL ADL Comments: Please see functional navigator for ADL status  See Function Navigator for Current Functional Status.   Therapy/Group: Individual Therapy  Alen BleacherBradsher, Onita Pfluger P 01/31/2018, 11:31 AM

## 2018-02-01 ENCOUNTER — Inpatient Hospital Stay (HOSPITAL_COMMUNITY): Payer: Medicare Other | Admitting: Occupational Therapy

## 2018-02-01 ENCOUNTER — Inpatient Hospital Stay (HOSPITAL_COMMUNITY): Payer: Medicare Other | Admitting: Physical Therapy

## 2018-02-01 ENCOUNTER — Inpatient Hospital Stay (HOSPITAL_COMMUNITY): Payer: Medicare Other | Admitting: Speech Pathology

## 2018-02-01 DIAGNOSIS — S069X3S Unspecified intracranial injury with loss of consciousness of 1 hour to 5 hours 59 minutes, sequela: Secondary | ICD-10-CM

## 2018-02-01 LAB — PROTIME-INR
INR: 2.71
PROTHROMBIN TIME: 28.6 s — AB (ref 11.4–15.2)

## 2018-02-01 MED ORDER — WARFARIN SODIUM 2.5 MG PO TABS
2.5000 mg | ORAL_TABLET | Freq: Once | ORAL | Status: AC
  Administered 2018-02-01: 2.5 mg via ORAL
  Filled 2018-02-01: qty 1
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
## 2018-02-01 NOTE — Progress Notes (Signed)
Physical Therapy Session Note  Patient Details  Name: Brandon [name] MRN: [id number] Date of Birth: 12/25/1940  Today's Date: 02/01/2018 PT Individual Time: 1013-1042 PT Individual Time Calculation (min): 29 min   Short Term Goals: Week 2:  PT Short Term Goal 1 (Week 2): Pt will maintain static sitting balance with mod A PT Short Term Goal 2 (Week 2): Pt will perform sliding board transfer with assist x 1 consistently PT Short Term Goal 3 (Week 2): Pt will initiate standing with LRAD to work on standing tolerance  Skilled Therapeutic Interventions/Progress Updates:    Patient received up in tilt in space WC, appearing slightly anxious due to loud noise in hallway. Transported patient totalA via WC to therapy gym, he is very somnolent and required Max cues for arousal and to interact with PT this session. Sliding board transfer to mat table with totalAx2, no effort noted by patient. Worked on sitting balance with UEs in ER and extension for support, briefly able to maintain upright sitting with min guardx2 for approximately 3-4 minutes but then required ModA x1-2 due to somnolence and lethargy. Attempted lateral leans to elbow on L side with totalA, patient with poor initiation. [facility] and totalA+[facility], and transported back to his room totalA via WC. He was left up in the chair in tilted position, chair alarm active, RN present and attending.   Therapy Documentation Precautions:  Precautions Precautions: Fall, Cervical Required Braces or Orthoses: Cervical Brace Cervical Brace: Hard collar, Other (comment)(Don/doff in sitting. Ok to doff in bed, when ambulating to bathroom, and when showering per MD order) Restrictions Weight Bearing Restrictions: No General: PT Amount of Missed Time (min): 1 Minutes Pain: Pain Assessment Pain Scale: 0-10 Pain Score: 0-No pain   See Function Navigator for Current Functional Status.   Therapy/Group: Individual  Therapy  Brandon [name] PT, DPT, CBIS  Supplemental Physical Therapist [facility]   Pager [phone]    02/01/2018, 10:50 AM

## 2018-02-01 NOTE — Progress Notes (Signed)
Physical Therapy Session Note  Patient Details  Name: Brandon [name] MRN: [id number] Date of Birth: 03/19/1941  Today's Date: 02/01/2018 PT Individual Time: 1300-1315 PT Individual Time Calculation (min): 15 min   Short Term Goals: Week 2:  PT Short Term Goal 1 (Week 2): Pt will maintain static sitting balance with mod A PT Short Term Goal 2 (Week 2): Pt will perform sliding board transfer with assist x 1 consistently PT Short Term Goal 3 (Week 2): Pt will initiate standing with LRAD to work on standing tolerance  Skilled Therapeutic Interventions/Progress Updates:    Patient received up in [facility], very lethargic and somnolent this afternoon; RN reports that patient has been very lethargic and has not woken up when she has been performing care. Attempted to rouse patient, able to do so very briefly with patient whispering and with unintelligible speech, then fell right back asleep. Unable to keep patient roused or alert enough to participate in therapy session this afternoon. Focused on stretching of hamstrings, hip adductors,and heel cords with spasticity noted B LEs. Brandon [name] was left up in the chair with all needs met, alarm active. 30 missed minutes due to patient lethargy/somnolence severely limiting participation.   Therapy Documentation Precautions:  Precautions Precautions: Fall, Cervical Required Braces or Orthoses: Cervical Brace Cervical Brace: Hard collar, Other (comment)(Don/doff in sitting. Ok to doff in bed, when ambulating to bathroom, and when showering per MD order) Restrictions Weight Bearing Restrictions: No General: PT Amount of Missed Time (min): 30 Minutes PT Missed Treatment Reason: Patient fatigue;Other (Comment)(lethargy/somnolence ) Pain: Pain Assessment Pain Scale: Faces Pain Score: 0-No pain Faces Pain Scale: No hurt   See Function Navigator for Current Functional Status.   Therapy/Group: Individual Therapy  Deniece Ree PT, DPT, CBIS  Supplemental Physical  Therapist [facility]   Pager [phone]   02/01/2018, 1:19 PM

## 2018-02-01 NOTE — Progress Notes (Signed)
Speech Language Pathology Daily Session Note  Patient Details  Name: Brandon [name] MRN: [id number] Date of Birth: 1940-09-04  Today's Date: 02/01/2018 SLP Individual Time: 1450-1550 SLP Individual Time Calculation (min): 60 min  Short Term Goals: Week 2: SLP Short Term Goal 1 (Week 2): Pt will consume therapeutic trials of Dysphagia III diet textures with timely oral phase + adequate oral clearance and without s/sx of aspiration across 3 consecutive sessions. SLP Short Term Goal 2 (Week 2): Pt will sustain attention to therapeutic tasks and/or conversation for ~15 minutes with less than 2 cues for redirection given Mod A cues. SLP Short Term Goal 3 (Week 2): Pt will complete functional, basic problem-solving tasks with Mod A cues.  SLP Short Term Goal 4 (Week 2): Pt will utilize external aids to recall functional information and daily events given Max A cues. SLP Short Term Goal 5 (Week 2): Pt will utilize speech intelligibility strategies to improve speech clarity/intelligibility to ~90% at the conversational level given Mod A cues.  Skilled Therapeutic Interventions: Skilled treatment sessio focused on cognition goals. Per chart review, it appears that pt has been lethargic and difficult to engage (possibly d/t depression over current situation). SLP received pt in bed, asleep but easily aroused. However pt with decreased motivation to actively engage in skilled tasks. With Total A emotional and cognitive support, pt able to sustain attention to high interest items in room (~ 5 minute intervals) and with encouragement able to reposition in bed and also express wants and needs for something to drink. Pt left supine in bed, bed alarm on and all needs within reach. Continue per current plan of care.      Function:  Eating Eating   Modified Consistency Diet: Yes Eating Assist Level: Helper feeds patient           Cognition Comprehension Comprehension assist level: Understands basic 25 -  49% of the time/ requires cueing 50 - 75% of the time  Expression   Expression assist level: Expresses basic 25 - 49% of the time/requires cueing 50 - 75% of the time. Uses single words/gestures.  Social Interaction Social Interaction assist level: Interacts appropriately 25 - 49% of time - Needs frequent redirection.  Problem Solving Problem solving assist level: Solves basic less than 25% of the time - needs direction nearly all the time or does not effectively solve problems and may need a restraint for safety  Memory Memory assist level: Recognizes or recalls less than 25% of the time/requires cueing greater than 75% of the time    Pain Pain Assessment Pain Scale: Faces Faces Pain Scale: No hurt  Therapy/Group: Individual Therapy  Brandon [name] 02/01/2018, 3:50 PM

## 2018-02-01 NOTE — Progress Notes (Signed)
Occupational Therapy Session Note  Patient Details  Name: Brandon [name] MRN: [id number] Date of Birth: 01-10-1941  Today's Date: 02/01/2018 OT Individual Time: 1100-1131 OT Individual Time Calculation (min): 31 min    Short Term Goals: Week 2:  OT Short Term Goal 1 (Week 2): Pt will initiate washing 2 body areas during ADL session  OT Short Term Goal 2 (Week 2): Pt will scan for 1 ADL item with mod vcs during grooming tasks OT Short Term Goal 3 (Week 2): Pt will complete BSC transfer with 2 assist  Skilled Therapeutic Interventions/Progress Updates:    Pt received in room and begins shaking and yelling. OT able to alert pt enough for him to state , " Nightmare". Pt agreeable to going outside for fresh air and more alerting spaces. However, once getting outside OT unable to arouse pt from sleep. He was unable to open eyes or acknowledge therapist in any ways. Pt returned to RN station via tilt in space wheelchair for safety.  30 missed minutes secondary to lethargy.   Therapy Documentation Precautions:  Precautions Precautions: Fall, Cervical Required Braces or Orthoses: Cervical Brace Cervical Brace: Hard collar, Other (comment)(Don/doff in sitting. Ok to doff in bed, when ambulating to bathroom, and when showering per MD order) Restrictions Weight Bearing Restrictions: No General: General OT Amount of Missed Time: 29 Minutes Vital Signs: Therapy Vitals Pulse Rate: 74 BP: (!) 78/65 Patient Position (if appropriate): Sitting Oxygen Therapy SpO2: 95 % O2 Device: Room Air Pain: Pain Assessment Pain Scale: 0-10 Pain Score: 0-No pain ADL: ADL ADL Comments: Please see functional navigator for ADL status  See Function Navigator for Current Functional Status.   Therapy/Group: Individual Therapy  Alen BleacherBradsher, [name] 02/01/2018, 12:08 PM

## 2018-02-01 NOTE — Progress Notes (Signed)
Pt daughter stopped by to visit. Pt cont to be lethargic. Unable to keep awaken in her presence. Daughter stated fiance may come by to see pt also to call if any changes were made. Notified Dan, PA of lethargy throughout shift. Will cont to monitor.   Ross LudwigKAYLA M Noelle Hoogland, LPN

## 2018-02-01 NOTE — Progress Notes (Signed)
Social Work Patient ID: Brandon [name], male   DOB: 31-Aug-1940, 77 y.o.   MRN: [id number]  Have reviewed team conference with pt/daughter and fiance.  Daughter and fiance aware team ELOS now at 3 weeks and that aim for pt to reach a +1 level of assistance prior to SNF transfer.  Have also scheduled for a family conference next Friday 9/6 @ 9:00 am per request of daughter.  Tx team aware.  Continue to follow.  Darlene Bartelt, LCSW

## 2018-02-01 NOTE — Progress Notes (Signed)
Physical Therapy Session Note  Patient Details  Name: Brandon [name] MRN: [id number] Date of Birth: January 28, 1941  Today's Date: 02/01/2018 PT Co-Treatment Time: 0830-0900 PT Co-Treatment Time Calculation (min): 30 min  Short Term Goals: Week 2:  PT Short Term Goal 1 (Week 2): Pt will maintain static sitting balance with mod A PT Short Term Goal 2 (Week 2): Pt will perform sliding board transfer with assist x 1 consistently PT Short Term Goal 3 (Week 2): Pt will initiate standing with LRAD to work on standing tolerance  Skilled Therapeutic Interventions/Progress Updates: Skilled co-treat with PT for focus on functional mobility and activity tolerance. Performed bed mobility maxA rolling R/L for changing brief after incontinent of bowel, donning brief. Supine>sit maxA for trunk control. Transfer bed>w/c with +2A; transfer to mat table with beasy board +2 totalA. During transfer, pt's hips came off beasy board disc and unable to demonstrate AROM scooting technique for level transfer. Remainder of session with focus on LE ROM in long sitting and circle sitting, rolling in supine. Remained on mat table with other therapist at end of session.      Therapy Documentation Precautions:  Precautions Precautions: Fall, Cervical Required Braces or Orthoses: Cervical Brace Cervical Brace: Hard collar, Other (comment)(Don/doff in sitting. Ok to doff in bed, when ambulating to bathroom, and when showering per MD order) Restrictions Weight Bearing Restrictions: No Pain: Pain Assessment Pain Scale: 0-10 Pain Score: 0-No pain   See Function Navigator for Current Functional Status.   Therapy/Group: Co-Treatment  Carolynn CommentElizabeth J Seigle 02/01/2018, 10:32 AM
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
## 2018-02-01 NOTE — Progress Notes (Signed)
Physical Therapy Session Note  Patient Details  Name: Brandon [name] MRN: [id number] Date of Birth: Jul 12, 1940  Today's Date: 02/01/2018 PT Co-Treatment Time: 900-945 (total time with pt 830-945) PT Co-Treatment Time Calculation: 45 minutes     Short Term Goals: Week 2:  PT Short Term Goal 1 (Week 2): Pt will maintain static sitting balance with mod A PT Short Term Goal 2 (Week 2): Pt will perform sliding board transfer with assist x 1 consistently PT Short Term Goal 3 (Week 2): Pt will initiate standing with LRAD to work on standing tolerance  Skilled Therapeutic Interventions/Progress Updates:    no c/o pain at rest, but does begin grimacing with movement ~1 hr into session, monitored pain and applied warm pack at end of session.  Session focus on following 1-step commands with functional mobility, balance, and ROM.  First 60 minutes of session skilled co-treat with 2nd PT.   Pt incontinent x3 during session.  Rolling in bed for clothing management/hygiene with mod assist overall, multimodal cues for LE positioning and use of UEs to hook onto bedrails.  PT donned teds, aces, pants, shoes, and abdominal binder total assist.  Supine>sit in bed with max assist +1 with HOB elevated.  Slide board transfer to w/c with +2 assist, no perceived effort from pt outside of forward weight shift.    Beasy board transfer to and from therapy mat with +2 assist.  Attempted to have pt initiate lateral scoot on beasy board, but pt unable 2/2 cognitive deficits and coordination/motor planning deficits.  Seated balance focus on finding balance point with UE posterior prop support progress from max assist to min assist with time.  +2 for position in long sitting, initially max assist for balance in this position, for hamstring stretch with pt grimacing.  Transition to circle sitting with UEs hooking on LEs for extended hip flexor stretch.  Pt able to hook to therapists UEs to pull self forward into stretch but  requires assist to maintain.  Long sit>supine with assist to control trunk.  Rolling from modified supine to L side progress from mod assist to supervision with max multimodal cues for use of UEs to reach and push through RLE to aid with rolling.  L side lying>supine>R side lying>sit with max assist 2/2 fatigue.  Beasy board transfer back to w/c in same manner as above, heat applied to R shoulder/upper trap for pain control and pt positioned in tilt in space and in nursing station for increased arousal and orientation.    Therapy Documentation Precautions:  Precautions Precautions: Fall, Cervical Required Braces or Orthoses: Cervical Brace Cervical Brace: Hard collar, Other (comment)(Don/doff in sitting. Ok to doff in bed, when ambulating to bathroom, and when showering per MD order) Restrictions Weight Bearing Restrictions: No   See Function Navigator for Current Functional Status.   Therapy/Group: Individual Therapy  Stephania FragminCaitlin E [name] 02/01/2018, 9:52 AM

## 2018-02-01 NOTE — Progress Notes (Signed)
Pt called Nurse into room. Pt asked to call his Daughter Brandon [name]. Nurse called daughter for patient and he told her they needed to talk that she needed to come here tonight to discuss some issues. He also told her he felt like "taking his life" After pt hung up phone, nurse asked questions regarding if pt felt depressed and what he meant by "wanting to take his life". Pt stated he was tired and didn't feel like he couldn't do anything else to help anyone. I asked if he had a plan to take his life and he stated "drive off a cliff, or some kind of poison". Nurse explained to pt that he was in a safe environment and Nurse would seek help for the patient. Encouraged pt to think about happy thoughts and if he felt like he needed to talk to someone to please let staff know. Nurse also called daughter. Pt was assessed for A&O x 4. Pt is aware of situation and awake and alert. Denied pain at present. Bed rails up x 3. Soft touch bell in reach. Will cont to monitor.   Brandon LudwigKAYLA M [name], LPN 16/03/9607/29/19, 0:456:42 PM

## 2018-02-01 NOTE — Progress Notes (Signed)
Pt has been drowsy throughout shift. Pt unable to stay awake and alert enough to take meds. Notified Pam Love, PA of meds not being given, encouraged nurse to try to admin BP meds. Assessed VS prior to giving meds and BP 78/65 sitting in WC. Meds not given at this time. Chair alarm present and soft call bell in reach. Will cont to monitor.   Ross LudwigKAYLA M Delrick Dehart, LPN

## 2018-02-02 ENCOUNTER — Inpatient Hospital Stay (HOSPITAL_COMMUNITY): Payer: Medicare Other | Admitting: Occupational Therapy

## 2018-02-02 ENCOUNTER — Inpatient Hospital Stay (HOSPITAL_COMMUNITY): Payer: Medicare Other | Admitting: Physical Therapy

## 2018-02-02 ENCOUNTER — Inpatient Hospital Stay (HOSPITAL_COMMUNITY): Payer: Medicare Other | Admitting: Speech Pathology

## 2018-02-02 LAB — CBC
HCT: 38.6 % — ABNORMAL LOW (ref 39.0–52.0)
HEMOGLOBIN: 13.1 g/dL (ref 13.0–17.0)
MCH: 32.3 pg (ref 26.0–34.0)
MCHC: 33.9 g/dL (ref 30.0–36.0)
MCV: 95.1 fL (ref 78.0–100.0)
PLATELETS: 379 10*3/uL (ref 150–400)
RBC: 4.06 MIL/uL — ABNORMAL LOW (ref 4.22–5.81)
RDW: 12.6 % (ref 11.5–15.5)
WBC: 8.6 10*3/uL (ref 4.0–10.5)

## 2018-02-02 LAB — PROTIME-INR
INR: 3.41
PROTHROMBIN TIME: 34.2 s — AB (ref 11.4–15.2)

## 2018-02-02 MED ORDER — METHOCARBAMOL 500 MG PO TABS
500.0000 mg | ORAL_TABLET | Freq: Once | ORAL | Status: AC
  Administered 2018-02-02: 500 mg via ORAL
  Filled 2018-02-02: qty 1

## 2018-02-02 NOTE — Progress Notes (Signed)
Occupational Therapy Session Note  Patient Details  Name: Brandon [name] MRN: [id number] Date of Birth: [birth year]  Today's Date: 02/02/2018 OT Individual Time: [id number] OT Individual Time Calculation (min): 70 min   Short Term Goals: Week 2:  OT Short Term Goal 1 (Week 2): Pt will initiate washing 2 body areas during ADL session  OT Short Term Goal 2 (Week 2): Pt will scan for 1 ADL item with mod vcs during grooming tasks OT Short Term Goal 3 (Week 2): Pt will complete BSC transfer with 2 assist  Skilled Therapeutic Interventions/Progress Updates:    Pt greeted supine in bed with no c/o pain. Appearing cheerful and motivated for tx. Reporting he spoke with dtr about his CIR progress, and felt much more encouraged after this talk. Therapist donned thigh high Teds and ACE wrapped LEs. Supine<sit completed with 2 helpers with vcs for anterior weight shift due to posterior lean. Pt able to maintain static sitting balance with Min-Mod A for 30 second intervals to don abdominal binder. Slideboard transfer<padded BSC completed with 2 assist. Rt>Lt weightbearing facilitated at elbow when brief and chuck pad were removed. He required Mod A and vcs to maintain upright trunk alignment. Pt with successful void of bowels! Total A for hygiene, and then pt transferred back to bed for brief/dressing. Max A rolling Rt>Lt with pt able to hook arms around therapist's arms. Worked on long sitting/circle sitting for threading LEs into pants and also during UB dressing/donning abdominal binder. Pt required 1 helper Total A for balance maintenance at this time due to fatigue. At end of session pt was repositioned for comfort in bed and left with soft call bell on chest. Bed alarm set.     Therapy Documentation Pain: In Lt side at end of session. RN made aware.  Pain Assessment Pain Scale: 0-10 Pain Score: 0-No pain ADL: ADL ADL Comments: Please see functional navigator for ADL status     See Function Navigator  for Current Functional Status.   Therapy/Group: Individual Therapy  Jerauld Bostwick A Tache Bobst 02/02/2018, 10:54 AM

## 2018-02-02 NOTE — Plan of Care (Signed)
  Problem: Consults Goal: RH SPINAL CORD INJURY PATIENT EDUCATION Description  See Patient Education module for education specifics.  Outcome: Progressing Goal: Skin Care Protocol Initiated - if Braden Score 18 or less Description If consults are not indicated, leave blank or document N/A Outcome: Progressing   Problem: SCI BOWEL ELIMINATION Goal: RH STG MANAGE BOWEL WITH ASSISTANCE Description STG Manage Bowel with mod Assistance.  Outcome: Progressing Goal: RH STG SCI MANAGE BOWEL PROGRAM W/ASSIST OR AS APPROPRIATE Description STG SCI Manage bowel program w/mod assist or as appropriate.  Outcome: Progressing   Problem: SCI BLADDER ELIMINATION Goal: RH STG MANAGE BLADDER WITH ASSISTANCE Description STG Manage Bladder With mod Assistance  Outcome: Progressing Goal: RH STG MANAGE BLADDER WITH EQUIPMENT WITH ASSISTANCE Description STG Manage Bladder With Equipment With mod Assistance  Outcome: Progressing Goal: RH STG SCI MANAGE BLADDER PROGRAM W/ASSISTANCE Description Mod assist  Outcome: Progressing   Problem: RH SKIN INTEGRITY Goal: RH STG SKIN FREE OF INFECTION/BREAKDOWN Description Patients skin will remain free from further infection or breakdown with mod assist.  Outcome: Progressing Goal: RH STG MAINTAIN SKIN INTEGRITY WITH ASSISTANCE Description STG Maintain Skin Integrity With mod Assistance.  Outcome: Progressing Goal: RH STG ABLE TO PERFORM INCISION/WOUND CARE W/ASSISTANCE Description STG Able To Perform Incision/Wound Care With total Assistance from caregiver .  Outcome: Progressing   Problem: RH SAFETY Goal: RH STG ADHERE TO SAFETY PRECAUTIONS W/ASSISTANCE/DEVICE Description STG Adhere to Safety Precautions With mod Assistance/Device.  Outcome: Progressing   Problem: RH PAIN MANAGEMENT Goal: RH STG PAIN MANAGED AT OR BELOW PT'S PAIN GOAL Description < 4  Outcome: Progressing   Problem: RH KNOWLEDGE DEFICIT SCI Goal: RH STG INCREASE KNOWLEDGE  OF SELF CARE AFTER SCI Description Mod assist  Outcome: Progressing   
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
## 2018-02-02 NOTE — Progress Notes (Addendum)
Pt step daughter, Shanda BumpsJessica present with pt. Concerned about pt c/o of spasm pain to Bilat LE. Explained that pain meds and muscle relaxer d/t increased confusion and sleeping all the time. She requested MD be called. Nurse called Jesusita Okaan, PA explained family concerns. Ordered 1 x dose Robaxin 500 mg. Will administer and monitor pt.  Ross LudwigKAYLA M Kaylan Friedmann, LPN

## 2018-02-02 NOTE — Plan of Care (Signed)
  Problem: SCI BLADDER ELIMINATION Goal: RH STG MANAGE BLADDER WITH EQUIPMENT WITH ASSISTANCE Description STG Manage Bladder With Equipment With mod Assistance  Outcome: Progressing  In and out cath as ordered

## 2018-02-02 NOTE — Progress Notes (Signed)
ANTICOAGULATION CONSULT NOTE - Follow Up Consult  Pharmacy Consult for warfarin Indication: atrial fibrillation  Allergies  Allergen Reactions  . Flecainide Palpitations and Other (See Comments)    TACHYCARDIA SYNCOPE > LOC  . Sulfamethoxazole Other (See Comments)    Internal bleeding    Patient Measurements: Height: 6\' 1"  (185.4 cm) Weight: 186 lb 1.1 oz (84.4 kg) IBW/kg (Calculated) : 79.9  Vital Signs: Temp: 97.6 F (36.4 C) (08/30 0521) Temp Source: Oral (08/30 0521) BP: 139/95 (08/30 0521) Pulse Rate: 86 (08/30 0521)  Labs: Recent Labs    01/31/18 0507 02/01/18 0658 02/02/18 0511  HGB  --   --  13.1  HCT  --   --  38.6*  PLT  --   --  379  LABPROT 28.6* 28.6* 34.2*  INR 2.71 2.71 3.41    Estimated Creatinine Clearance: 90.2 mL/min (by C-G formula based on SCr of 0.72 mg/dL).   Medical History: Past Medical History:  Diagnosis Date  . Atrial fibrillation (HCC)     Medications:  Medications Prior to Admission  Medication Sig Dispense Refill Last Dose  . escitalopram (LEXAPRO) 10 MG tablet Take 10 mg by mouth daily.  0 01/15/2018 at Unknown time  . lisinopril (PRINIVIL,ZESTRIL) 20 MG tablet Take 20 mg by mouth daily.  2 01/15/2018 at Unknown time  . metoprolol tartrate (LOPRESSOR) 50 MG tablet Take 75 mg by mouth daily.   0 01/15/2018 at Unknown time  . tamsulosin (FLOMAX) 0.4 MG CAPS capsule Take 0.4 mg by mouth daily with supper.  2 01/15/2018 at Unknown time  . warfarin (COUMADIN) 5 MG tablet Take 2.5-5 mg by mouth See admin instructions. Take 5 mg on Tues, Thurs. Saturday Take 2.5 mg on Mon. Wed. Fri. And Sunday Take in the Morning  0 01/15/2018 at Unknown time    Assessment: 77 y/o male on Coumadin 2.5mg  daily exc for 5mg  on TTSat PTA for Afib. INR was 2.66 on admit. Kcentra given due to epidural hematoma. Restarting Coumadin on 8/19 with no bridge. INR trending up to 3.41 with lower doses the last couple days. CBC stable.  Goal of Therapy:  INR  2-3 Monitor platelets by anticoagulation protocol: Yes   Plan:  Hold Coumadin tonight Monitor daily INR, CBC, s/s of bleed  Enzo BiNathan Cherice Glennie, PharmD, BCPS Clinical Pharmacist Phone number [phone] 02/02/2018 8:28 AM

## 2018-02-02 NOTE — Progress Notes (Signed)
Speech Language Pathology Weekly Progress and Session Note  Patient Details  Name: Brandon [name] MRN: [id number] Date of Birth: 1940-07-22  Beginning of progress report period: January 26, 2018 End of progress report period: February 02, 2018  Today's Date: 02/02/2018 SLP Individual Time: 1100-1200 SLP Individual Time Calculation (min): 60 min  Short Term Goals: Week 2: SLP Short Term Goal 1 (Week 2): Pt will consume therapeutic trials of Dysphagia III diet textures with timely oral phase + adequate oral clearance and without s/sx of aspiration across 3 consecutive sessions. SLP Short Term Goal 1 - Progress (Week 2): Discontinued (comment)(Deficits related to mentation, will target once mentation improves) SLP Short Term Goal 2 (Week 2): Pt will sustain attention to therapeutic tasks and/or conversation for ~15 minutes with less than 2 cues for redirection given Mod A cues. SLP Short Term Goal 2 - Progress (Week 2): Not met SLP Short Term Goal 3 (Week 2): Pt will complete functional, basic problem-solving tasks with Mod A cues.  SLP Short Term Goal 3 - Progress (Week 2): Not met SLP Short Term Goal 4 (Week 2): Pt will utilize external aids to recall functional information and daily events given Max A cues. SLP Short Term Goal 4 - Progress (Week 2): Not met SLP Short Term Goal 5 (Week 2): Pt will utilize speech intelligibility strategies to improve speech clarity/intelligibility to ~90% at the conversational level given Mod A cues. SLP Short Term Goal 5 - Progress (Week 2): Not met    New Short Term Goals: Week 3: SLP Short Term Goal 1 (Week 3): Pt will sustain attention to basic familiar tasks for ~ 10 minutes with Mod A cues.  SLP Short Term Goal 2 (Week 3): Pt will utilize speech intelligibility strategies to improve speech intelligibility to ~90% at the sentence level given Mod A cues. SLP Short Term Goal 3 (Week 3): Given 2 choices related to orientation information, pt will select  correct choice in 8 of 10 opportunities with Mod A cues.   Weekly Progress Updates: Pt has made minimal progress this reporting period and continues to experience significant cognitive deficits and language of confusion. Pt is difficult to redirect and has not demonstrated ability to carry over information d/t confusion. Pt's mentation impacts his ability to effectively consume enough PO intake and his ability to advance diet to more difficult textures. At this time, dysphagia goals will be discontinued and can be readdressed with mentaiton is improved. Pt has made progress towards his speech intelligibility goals as he is more intelligible when up in chair and when more alert. Pt continues to require complete care in all areas of ADLs and cognitive function. Functional goals are difficult to set d/t language of confusion and inability to redirect to correct information. Recommend targeting sustained attention, tasks for orientation and speech intelligibility. Pt's prognosis for improvement is guarded to poor given history of memory deficits at baseline, severity of confusion and time post onset. Recommend SNF placement as pt will require Total care at discharge from CIR.      Daily Session  Skilled Therapeutic Interventions: Skilled treatment session focused on cognition goals. SLP facilitated session by providing Max A redirection for tasks at hand/situation in setting of significant language of confusion. When given direct question "are you thirsty/do you want something to drink?" pt able to answer yes/no etc. However pt is not able to answer questions related to orientation, carry over information posted on TV regarding day of week or sustain attention to functional  tasks without Max A cues. Pt's level of confusion/delirium interferes with task completion and carry over of information.   Later in afternoon, pt was in bed yelling out into hallway that he "needed help with the mail." Attempted to  redirect/reposition/turn TV but pt began yelling out into hallway again after SLP left room. Nursing entering room to assist.    Function:     Cognition Comprehension Comprehension assist level: Understands basic 25 - 49% of the time/ requires cueing 50 - 75% of the time;Understands basic less than 25% of the time/ requires cueing >75% of the time  Expression   Expression assist level: Expresses basic 25 - 49% of the time/requires cueing 50 - 75% of the time. Uses single words/gestures.  Social Interaction Social Interaction assist level: Interacts appropriately less than 25% of the time. May be withdrawn or combative.;Interacts appropriately 25 - 49% of time - Needs frequent redirection.  Problem Solving Problem solving assist level: Solves basic less than 25% of the time - needs direction nearly all the time or does not effectively solve problems and may need a restraint for safety  Memory Memory assist level: Recognizes or recalls less than 25% of the time/requires cueing greater than 75% of the time   General    Pain    Therapy/Group: Individual Therapy  Brandon [name] 02/02/2018, 12:13 PM

## 2018-02-02 NOTE — Progress Notes (Signed)
Physical Therapy Session Note  Patient Details  Name: Brandon [name] MRN: [id number] Date of Birth: 28-Aug-1940  Today's Date: 02/02/2018 PT Individual Time: 0930-1025 PT Individual Time Calculation (min): 55 min   Short Term Goals: Week 2:  PT Short Term Goal 1 (Week 2): Pt will maintain static sitting balance with mod A PT Short Term Goal 2 (Week 2): Pt will perform sliding board transfer with assist x 1 consistently PT Short Term Goal 3 (Week 2): Pt will initiate standing with LRAD to work on standing tolerance  Skilled Therapeutic Interventions/Progress Updates:    pt c/o Rt shoulder pain at PT arrival.  Pt max A to roll into Lt sidelying which pt states helps relieve Rt shoulder pain.  Pt then max A for sidelying to sit.  Scooting forward edge of bed with max manual facilitation for wt shifts, cues to position UEs to assist with scooting.  Sliding board transfers during session with +2 assist for safety, manual facilitation for wt shifts and UE positioning.  Seated on mat in short sitting with focus on using UEs for balance with pt able to maintain with min A/close supervision.  Long sitting and circle sitting with pt using UEs for balance and to assist LEs into position.  Emerging strength in Lt UE and LE are able to assist with mobility.  Pt better able to tolerate sitting and stretching this session.  Pt able to perform sliding board transfer to w/c at end of session with max A using UEs to assist with lifting.  Pt left in w/c with alarm set, needs at hand.  Therapy Documentation Precautions:  Precautions Precautions: Fall, Cervical Required Braces or Orthoses: Cervical Brace Cervical Brace: Hard collar, Other (comment)(Don/doff in sitting. Ok to doff in bed, when ambulating to bathroom, and when showering per MD order) Restrictions Weight Bearing Restrictions: No Pain: Pt c/o right shoulder pain, rest and repositioned during session   Therapy/Group: Individual  Therapy  Azael Ragain 02/02/2018, 10:26 AM

## 2018-02-03 ENCOUNTER — Inpatient Hospital Stay (HOSPITAL_COMMUNITY): Payer: Medicare Other

## 2018-02-03 LAB — PROTIME-INR
INR: 3.51
Prothrombin Time: 35 seconds — ABNORMAL HIGH (ref 11.4–15.2)

## 2018-02-03 LAB — CBC
HEMATOCRIT: 39.2 % (ref 39.0–52.0)
Hemoglobin: 13.2 g/dL (ref 13.0–17.0)
MCH: 31.9 pg (ref 26.0–34.0)
MCHC: 33.7 g/dL (ref 30.0–36.0)
MCV: 94.7 fL (ref 78.0–100.0)
Platelets: 371 10*3/uL (ref 150–400)
RBC: 4.14 MIL/uL — AB (ref 4.22–5.81)
RDW: 12.7 % (ref 11.5–15.5)
WBC: 8.8 10*3/uL (ref 4.0–10.5)
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
## 2018-02-03 NOTE — Progress Notes (Addendum)
ANTICOAGULATION CONSULT NOTE - Follow Up Consult  Pharmacy Consult for warfarin Indication: atrial fibrillation  Allergies  Allergen Reactions  . Flecainide Palpitations and Other (See Comments)    TACHYCARDIA SYNCOPE > LOC  . Sulfamethoxazole Other (See Comments)    Internal bleeding    Patient Measurements: Height: 6\' 1"  (185.4 cm) Weight: 186 lb 1.1 oz (84.4 kg) IBW/kg (Calculated) : 79.9  Vital Signs: Temp: 97.6 F (36.4 C) (08/31 0600) BP: 148/91 (08/31 0600) Pulse Rate: 83 (08/31 0600)  Labs: Recent Labs    02/01/18 0658 02/02/18 0511 02/03/18 0654  HGB  --  13.1 13.2  HCT  --  38.6* 39.2  PLT  --  379 371  LABPROT 28.6* 34.2* 35.0*  INR 2.71 3.41 3.51    Estimated Creatinine Clearance: 90.2 mL/min (by C-G formula based on SCr of 0.72 mg/dL).   Medical History: Past Medical History:  Diagnosis Date  . Atrial fibrillation (HCC)     Medications:  Medications Prior to Admission  Medication Sig Dispense Refill Last Dose  . escitalopram (LEXAPRO) 10 MG tablet Take 10 mg by mouth daily.  0 01/15/2018 at Unknown time  . lisinopril (PRINIVIL,ZESTRIL) 20 MG tablet Take 20 mg by mouth daily.  2 01/15/2018 at Unknown time  . metoprolol tartrate (LOPRESSOR) 50 MG tablet Take 75 mg by mouth daily.   0 01/15/2018 at Unknown time  . tamsulosin (FLOMAX) 0.4 MG CAPS capsule Take 0.4 mg by mouth daily with supper.  2 01/15/2018 at Unknown time  . warfarin (COUMADIN) 5 MG tablet Take 2.5-5 mg by mouth See admin instructions. Take 5 mg on Tues, Thurs. Saturday Take 2.5 mg on Mon. Wed. Fri. And Sunday Take in the Morning  0 01/15/2018 at Unknown time    Assessment: 77 y/o male on Coumadin 2.5mg  daily except for 5mg  on TTSat PTA for Afib. INR was 2.66 on admit. Kcentra given due to epidural hematoma. Restarting Coumadin on 8/19 with no bridge.   INR continues to trend up to 3.51 today with held dose last night. Hgb stable at 13.2 today.  Spoke with nurse who reports no s/s  of bleeding.  Goal of Therapy:  INR 2-3 Monitor platelets by anticoagulation protocol: Yes   Plan:  Continue to hold Coumadin tonight Monitor daily INR, CBC, s/s of bleeding  Gwynneth AlbrightSara Kelce Bouton, [place] PGY1 Pharmacy Resident  Phone number [phone] 02/03/2018 9:10 AM

## 2018-02-03 NOTE — Progress Notes (Signed)
Occupational Therapy Session Note  Patient Details  Name: Brandon [name] MRN: [id number] Date of Birth: [birth year]  1100-1209 69 min  Short Term Goals: Week 2:  OT Short Term Goal 1 (Week 2): Pt will initiate washing 2 body areas during ADL session  OT Short Term Goal 2 (Week 2): Pt will scan for 1 ADL item with mod vcs during grooming tasks OT Short Term Goal 3 (Week 2): Pt will complete BSC transfer with 2 assist  Skilled Therapeutic Interventions/Progress Updates:    1:1. Pt with no c/o pain. Pt with soiled brief. Pt rolls B with min A for facilitating log rolling reach over with shoulder for clothing management and peri care. OT dons teds, pants and abdominal binder. Pt supine>sitting EOB>slide board TIS with total A of 2. Pt reporting dizziness in w/c, OT tilts back, BP101/75, tilted up 76/57, tilted up added teds 83/60. Pt no longer reporting dizziness after15 min of grooming seated in chair. OT facilitates orientation discussion while donning teds. Pt oriented to location and self, however also thinking he will be going home at end of session as well as he is holding objects in his hands. Pt brushes teeth with LUE and min A for coordination/grip. Pt washes face/applies shaving cream to face with min A. OT shaves pt cheeks with collar still donned d/t safety concerns. Exited session with pt tilted back in w/c, call light in lap, and safety belt donned  Therapy Documentation Precautions:  Precautions Precautions: Fall, Cervical Required Braces or Orthoses: Cervical Brace Cervical Brace: Hard collar, Other (comment)(Don/doff in sitting. Ok to doff in bed, when ambulating to bathroom, and when showering per MD order) Restrictions Weight Bearing Restrictions: No  See Function Navigator for Current Functional Status.   Therapy/Group: Individual Therapy  Shon HaleStephanie M Emory Leaver 02/03/2018, 12:15 PM

## 2018-02-03 NOTE — Progress Notes (Signed)
Physical Therapy Session Note  Patient Details  Name: Brandon [name] MRN: [id number] Date of Birth: 1941/04/30  Today's Date: 02/02/2018 PT Individual Time: 1500-1530   30 min   Short Term Goals: Week 1:  PT Short Term Goal 1 (Week 1): Pt will perform bed mobility max assist +1 PT Short Term Goal 1 - Progress (Week 1): Progressing toward goal PT Short Term Goal 2 (Week 1): Pt will tolerate sitting in w/c for 60 min in between therapy sessions w/o increase in fatigue  PT Short Term Goal 2 - Progress (Week 1): Met PT Short Term Goal 3 (Week 1): Pt will maintain static sitting balance w/ max assist PT Short Term Goal 3 - Progress (Week 1): Met PT Short Term Goal 4 (Week 1): Pt will initiate standing w/ LRAD to work on standing tolerance  PT Short Term Goal 4 - Progress (Week 1): Progressing toward goal Week 2:  PT Short Term Goal 1 (Week 2): Pt will maintain static sitting balance with mod A PT Short Term Goal 2 (Week 2): Pt will perform sliding board transfer with assist x 1 consistently PT Short Term Goal 3 (Week 2): Pt will initiate standing with LRAD to work on standing tolerance  Skilled Therapeutic Interventions/Progress Updates:   Pt reports being very tired upon PT entering room. PT instructed pt in supine therex for improve BLE Neuromotor control and spasticity control. All completed with AAROM. SLR, SAQ, hip flexion/extension, Hip abduction; x 8 each with prolonged stretch on last repetition. Pt noted to fall asleep during each bout of exercises and required increasing effort to arouse. RN states pt was recently given muscle relaxer and usually becomes extremely drowsy shortly after medication administration. Pt left asleep in bed with all needs met.      Therapy Documentation Precautions:  Precautions Precautions: Fall, Cervical Required Braces or Orthoses: Cervical Brace Cervical Brace: Hard collar, Other (comment)(Don/doff in sitting. Ok to doff in bed, when ambulating to  bathroom, and when showering per MD order) Restrictions Weight Bearing Restrictions: No General:   missed minutes: 60: fatigue/lethargy Vital Signs: Therapy Vitals Temp: 97.6 F (36.4 C) Pulse Rate: 83 Resp: 17 BP: (!) 148/91 Patient Position (if appropriate): Lying Oxygen Therapy SpO2: 98 % O2 Device: Room Air Pain:denies.   See Function Navigator for Current Functional Status.   Therapy/Group: Individual Therapy  Lorie Phenix 02/03/2018, 7:52 AM

## 2018-02-04 ENCOUNTER — Inpatient Hospital Stay (HOSPITAL_COMMUNITY): Payer: Medicare Other

## 2018-02-04 LAB — CBC
HCT: 39.8 % (ref 39.0–52.0)
HEMOGLOBIN: 13.2 g/dL (ref 13.0–17.0)
MCH: 32 pg (ref 26.0–34.0)
MCHC: 33.2 g/dL (ref 30.0–36.0)
MCV: 96.4 fL (ref 78.0–100.0)
PLATELETS: 372 10*3/uL (ref 150–400)
RBC: 4.13 MIL/uL — AB (ref 4.22–5.81)
RDW: 12.9 % (ref 11.5–15.5)
WBC: 7.7 10*3/uL (ref 4.0–10.5)

## 2018-02-04 LAB — PROTIME-INR
INR: 3.23
Prothrombin Time: 32.7 seconds — ABNORMAL HIGH (ref 11.4–15.2)

## 2018-02-04 NOTE — Progress Notes (Signed)
Occupational Therapy Session Note  Patient Details  Name: Brandon [name] MRN: [id number] Date of Birth: [birth year]  Today's Date: 02/04/2018 OT Individual Time: 1400-1430 OT Individual Time Calculation (min): 30 min    Short Term Goals: Week 2:  OT Short Term Goal 1 (Week 2): Pt will initiate washing 2 body areas during ADL session  OT Short Term Goal 2 (Week 2): Pt will scan for 1 ADL item with mod vcs during grooming tasks OT Short Term Goal 3 (Week 2): Pt will complete BSC transfer with 2 assist  Skilled Therapeutic Interventions/Progress Updates:    OT intervention with focus on bed mobility and BLE stretching to facilitate bed mobility.  Pt resting in bed with c/o B shoulder pain.  Pt requested pain meds but RN explained that he wasn't scheduled at this time.  Pt required max A for rolling in bed.  Therapist attempted to assist pt to sitting position EOB but pt with increased tone/spasticity in BLE. BLE stretching employed to facility sitting position.  Decrease in tone noted.  Pt remained in bed with soft call bell positioned for pt to activate and pillows placed for support of BUE. Bed alarm activated.   Therapy Documentation Precautions:  Precautions Precautions: Fall, Cervical Required Braces or Orthoses: Cervical Brace Cervical Brace: Hard collar, Other (comment)(Don/doff in sitting. Ok to doff in bed, when ambulating to bathroom, and when showering per MD order) Restrictions Weight Bearing Restrictions: No Pain: Pt c/o B shoulder pain (8/10); ice applied and repositioned See Function Navigator for Current Functional Status.   Therapy/Group: Individual Therapy  Rich Brave 02/04/2018, 2:33 PM

## 2018-02-04 NOTE — Progress Notes (Signed)
Occupational Therapy Session Note  Patient Details  Name: Koston Maione MRN: [id number] Date of Birth: Oct 10, 1940  Today's Date: 02/04/2018 OT Individual Time: 1000-1056 OT Individual Time Calculation (min): 56 min    Skilled Therapeutic Interventions/Progress Updates:    1;1. Pt letharigic throughout session. OT applied teds, ace wraps, abdominal binder, and pants. Pt rolls B with MOD A and increased difficulty following instructions d/t lethargy for clothing management. Pt completes supine>sitting total A of 1. To sit EOB. Pt transfers total A of 2 to TIS. Pt given HOH A to doff shirt off BUE and pt able to "Punch arms" through new shirt sleeves.  Pt combs hair with HOH A of LUE. Pts alertness continues to fluctuate and BP assessed 72/57. BP continued to remain low despite tilting back. Pt reporting 9/10 pain in B shoulders. Pt transferred back to bed as stated above d/t low BP/alertness not appropriate for tx. Pt already asleep prior to doffing binder and snoring by the time pillows placed under pressure points. Exited session with pt in bed, call light in reach and exit alarm on  Therapy Documentation Precautions:  Precautions Precautions: Fall, Cervical Required Braces or Orthoses: Cervical Brace Cervical Brace: Hard collar, Other (comment)(Don/doff in sitting. Ok to doff in bed, when ambulating to bathroom, and when showering per MD order) Restrictions Weight Bearing Restrictions: No General:   Vital Signs:   See Function Navigator for Current Functional Status.   Therapy/Group: Individual Therapy  Shon Hale 02/04/2018, 10:57 AM

## 2018-02-04 NOTE — Progress Notes (Signed)
ANTICOAGULATION CONSULT NOTE - Follow Up Consult  Pharmacy Consult for warfarin Indication: atrial fibrillation  Allergies  Allergen Reactions  . Flecainide Palpitations and Other (See Comments)    TACHYCARDIA SYNCOPE > LOC  . Sulfamethoxazole Other (See Comments)    Internal bleeding    Patient Measurements: Height: 6\' 1"  (185.4 cm) Weight: 186 lb 1.1 oz (84.4 kg) IBW/kg (Calculated) : 79.9  Vital Signs: Temp: 97.5 F (36.4 C) (09/01 0517) BP: 118/86 (09/01 0517) Pulse Rate: 64 (09/01 0517)  Labs: Recent Labs    02/02/18 0511 02/03/18 0654 02/04/18 0615  HGB 13.1 13.2 13.2  HCT 38.6* 39.2 39.8  PLT 379 371 372  LABPROT 34.2* 35.0* 32.7*  INR 3.41 3.51 3.23    Estimated Creatinine Clearance: 90.2 mL/min (by C-G formula based on SCr of 0.72 mg/dL).   Medical History: Past Medical History:  Diagnosis Date  . Atrial fibrillation (HCC)     Medications:  Medications Prior to Admission  Medication Sig Dispense Refill Last Dose  . escitalopram (LEXAPRO) 10 MG tablet Take 10 mg by mouth daily.  0 01/15/2018 at Unknown time  . lisinopril (PRINIVIL,ZESTRIL) 20 MG tablet Take 20 mg by mouth daily.  2 01/15/2018 at Unknown time  . metoprolol tartrate (LOPRESSOR) 50 MG tablet Take 75 mg by mouth daily.   0 01/15/2018 at Unknown time  . tamsulosin (FLOMAX) 0.4 MG CAPS capsule Take 0.4 mg by mouth daily with supper.  2 01/15/2018 at Unknown time  . warfarin (COUMADIN) 5 MG tablet Take 2.5-5 mg by mouth See admin instructions. Take 5 mg on Tues, Thurs. Saturday Take 2.5 mg on Mon. Wed. Fri. And Sunday Take in the Morning  0 01/15/2018 at Unknown time    Assessment: 77 y/o male on Coumadin 2.5mg  daily except for 5mg  on TTSat PTA for Afib. INR was 2.66 on admit. Kcentra given due to epidural hematoma. Restarting Coumadin on 8/19 with no bridge.   INR has slightly decreased but still supra therapeutic at 3.23 today with holding warfarin x2 days. Hgb stable at 13.2 today.  PO  intake reported has been consistently low throughout admission. No s/s of bleeding reported.  Goal of Therapy:  INR 2-3 Monitor platelets by anticoagulation protocol: Yes   Plan:  Continue to hold Coumadin tonight due to supratherapeutic INR and recent hematoma. Consider restarting at lower warfarin dose once INR is no longer supratherapeutic if PO intake remains consistently low.  Will continue to monitor daily INR, CBC, s/s of bleeding  Gwynneth Albright, [place] D PGY1 Pharmacy Resident  Phone number [phone] 02/04/2018 8:00 AM
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
## 2018-02-05 ENCOUNTER — Inpatient Hospital Stay (HOSPITAL_COMMUNITY): Payer: Medicare Other

## 2018-02-05 ENCOUNTER — Inpatient Hospital Stay (HOSPITAL_COMMUNITY): Payer: Medicare Other | Admitting: Physical Therapy

## 2018-02-05 ENCOUNTER — Inpatient Hospital Stay (HOSPITAL_COMMUNITY): Payer: Medicare Other | Admitting: Occupational Therapy

## 2018-02-05 DIAGNOSIS — R1312 Dysphagia, oropharyngeal phase: Secondary | ICD-10-CM

## 2018-02-05 LAB — BASIC METABOLIC PANEL
Anion gap: 7 (ref 5–15)
BUN: 19 mg/dL (ref 8–23)
CHLORIDE: 104 mmol/L (ref 98–111)
CO2: 26 mmol/L (ref 22–32)
CREATININE: 0.86 mg/dL (ref 0.61–1.24)
Calcium: 8.6 mg/dL — ABNORMAL LOW (ref 8.9–10.3)
GFR calc Af Amer: >60 mL/min (ref >60)
Glucose, Bld: 92 mg/dL (ref 70–99)
Potassium: 3.9 mmol/L (ref 3.5–5.1)
SODIUM: 137 mmol/L (ref 135–145)

## 2018-02-05 LAB — PROTIME-INR
INR: 2.73
Prothrombin Time: 28.7 seconds — ABNORMAL HIGH (ref 11.4–15.2)

## 2018-02-05 MED ORDER — WARFARIN SODIUM 2.5 MG PO TABS
2.5000 mg | ORAL_TABLET | Freq: Once | ORAL | Status: AC
  Administered 2018-02-05: 2.5 mg via ORAL
  Filled 2018-02-05: qty 1

## 2018-02-05 NOTE — Progress Notes (Signed)
Physical Therapy Weekly Progress Note  Patient Details  Name: Brandon [name] MRN: [id number] Date of Birth: 1940-10-09  Beginning of progress report period: January 27, 2018 End of progress report period: February 05, 2018  Today's Date: 02/05/2018 PT Individual Time: 6389-3734 PT Individual Time Calculation (min): 59 min   Patient has met 2 of 7} short term goals. Progress has been limited by patient's fluctuating level of alertness and participation with therapy.   Patient continues to demonstrate the following deficits muscle weakness, muscle joint tightness and muscle paralysis, decreased cardiorespiratoy endurance, impaired timing and sequencing, abnormal tone, unbalanced muscle activation, decreased coordination and decreased motor planning, decreased midline orientation and decreased motor planning, decreased initiation, decreased attention, decreased awareness, decreased problem solving, decreased safety awareness, decreased memory and delayed processing and decreased sitting balance, decreased standing balance, decreased postural control, decreased balance strategies and difficulty maintaining precautions and therefore will continue to benefit from skilled PT intervention to increase functional independence with mobility.  Patient is showing fluctuating levels of participation and progress with PT due to fluctuating levels of alertness and participation. Will continue to monitor and adjust LTGs as necessary. .  Continue plan of care.  PT Short Term Goals Week 3:  PT Short Term Goal 1 (Week 3): Patient to be able to maintain sitting balance with ModAx1  PT Short Term Goal 2 (Week 3): Patient to be able to perform sliding board transfer with assist of +1 consistently  PT Short Term Goal 3 (Week 3): Patient to initiate standing with MaxA and LRAD  PT Short Term Goal 4 (Week 3): Patient to be able to maintain consistent conversation and focus on simple tasks with therapist for at least 30  minute period without becoming fatigued or distracted   Skilled Therapeutic Interventions/Progress Updates:    Patient received in bed, seemingly alert and able to converse with PT, recall details about himself, his family, his wedding plans, and what college his grand-daughter will be going to. Applied TEDS, ACE wraps, and abdominal binder with totalA supine in bed to assist in BP control, then patient requested to be cathed prior to ongoing PT care. 16 minutes of PT session lost due to nursing care/cath procedure. Upon PT's return to room following cath, patient now very lethargic and sleeping in bed, unable to consistently rouse patient for participation in PT session but noted that he had had BM as well. He required TotalA for rolling in bed today due to lethargy, totalA for pericare and cleanup of multiple BMs as he had a second one right after he was cleaned after the first. He was left in bed with all needs met, bed alarm active, and RN attending this morning.    Therapy Documentation Precautions:  Precautions Precautions: Fall, Cervical Required Braces or Orthoses: Cervical Brace Cervical Brace: Hard collar, Other (comment)(Don/doff in sitting. Ok to doff in bed, when ambulating to bathroom, and when showering per MD order) Restrictions Weight Bearing Restrictions: No General: PT Amount of Missed Time (min): 16 Minutes PT Missed Treatment Reason: Nursing care Vital Signs:  Pain: Pain Assessment Pain Score: 8 shoulders, pre-medicated prior to session, repositioned  Vision/Perception     Mobility:   Locomotion :    Trunk/Postural Assessment :    Balance:   Exercises:   Other Treatments:     See Function Navigator for Current Functional Status.  Therapy/Group: Individual Therapy  Deniece Ree PT, DPT, CBIS  Supplemental Physical Therapist [facility]   Pager [phone]   02/05/2018, 12:04  PM  

## 2018-02-05 NOTE — Plan of Care (Signed)
  Problem: Consults Goal: RH SPINAL CORD INJURY PATIENT EDUCATION Description  See Patient Education module for education specifics.  Outcome: Progressing Goal: Skin Care Protocol Initiated - if Braden Score 18 or less Description If consults are not indicated, leave blank or document N/A Outcome: Progressing   Problem: SCI BOWEL ELIMINATION Goal: RH STG MANAGE BOWEL WITH ASSISTANCE Description STG Manage Bowel with mod Assistance.  Outcome: Progressing Goal: RH STG SCI MANAGE BOWEL PROGRAM W/ASSIST OR AS APPROPRIATE Description STG SCI Manage bowel program w/mod assist or as appropriate.  Outcome: Progressing   Problem: SCI BLADDER ELIMINATION Goal: RH STG MANAGE BLADDER WITH ASSISTANCE Description STG Manage Bladder With mod Assistance  Outcome: Progressing Goal: RH STG MANAGE BLADDER WITH EQUIPMENT WITH ASSISTANCE Description STG Manage Bladder With Equipment With mod Assistance  Outcome: Progressing Goal: RH STG SCI MANAGE BLADDER PROGRAM W/ASSISTANCE Description Mod assist  Outcome: Progressing   Problem: RH SKIN INTEGRITY Goal: RH STG SKIN FREE OF INFECTION/BREAKDOWN Description Patients skin will remain free from further infection or breakdown with mod assist.  Outcome: Progressing Goal: RH STG MAINTAIN SKIN INTEGRITY WITH ASSISTANCE Description STG Maintain Skin Integrity With mod Assistance.  Outcome: Progressing Goal: RH STG ABLE TO PERFORM INCISION/WOUND CARE W/ASSISTANCE Description STG Able To Perform Incision/Wound Care With total Assistance from caregiver .  Outcome: Progressing   Problem: RH SAFETY Goal: RH STG ADHERE TO SAFETY PRECAUTIONS W/ASSISTANCE/DEVICE Description STG Adhere to Safety Precautions With mod Assistance/Device.  Outcome: Progressing   Problem: RH PAIN MANAGEMENT Goal: RH STG PAIN MANAGED AT OR BELOW PT'S PAIN GOAL Description < 4  Outcome: Progressing   Problem: RH KNOWLEDGE DEFICIT SCI Goal: RH STG INCREASE KNOWLEDGE  OF SELF CARE AFTER SCI Description Mod assist  Outcome: Progressing   

## 2018-02-05 NOTE — Progress Notes (Signed)
ANTICOAGULATION CONSULT NOTE - Follow Up Consult  Pharmacy Consult for warfarin Indication: atrial fibrillation  Allergies  Allergen Reactions  . Flecainide Palpitations and Other (See Comments)    TACHYCARDIA SYNCOPE > LOC  . Sulfamethoxazole Other (See Comments)    Internal bleeding    Patient Measurements: Height: 6\' 1"  (185.4 cm) Weight: 186 lb 1.1 oz (84.4 kg) IBW/kg (Calculated) : 79.9  Vital Signs: Temp: 97.5 F (36.4 C) (09/02 0539) BP: 128/89 (09/02 0539) Pulse Rate: 75 (09/02 0539)  Labs: Recent Labs    02/03/18 0654 02/04/18 0615 02/05/18 0505  HGB 13.2 13.2  --   HCT 39.2 39.8  --   PLT 371 372  --   LABPROT 35.0* 32.7* 28.7*  INR 3.51 3.23 2.73  CREATININE  --   --  0.86    Estimated Creatinine Clearance: 83.9 mL/min (by C-G formula based on SCr of 0.86 mg/dL).   Medical History: Past Medical History:  Diagnosis Date  . Atrial fibrillation (HCC)     Medications:  Medications Prior to Admission  Medication Sig Dispense Refill Last Dose  . escitalopram (LEXAPRO) 10 MG tablet Take 10 mg by mouth daily.  0 01/15/2018 at Unknown time  . lisinopril (PRINIVIL,ZESTRIL) 20 MG tablet Take 20 mg by mouth daily.  2 01/15/2018 at Unknown time  . metoprolol tartrate (LOPRESSOR) 50 MG tablet Take 75 mg by mouth daily.   0 01/15/2018 at Unknown time  . tamsulosin (FLOMAX) 0.4 MG CAPS capsule Take 0.4 mg by mouth daily with supper.  2 01/15/2018 at Unknown time  . warfarin (COUMADIN) 5 MG tablet Take 2.5-5 mg by mouth See admin instructions. Take 5 mg on Tues, Thurs. Saturday Take 2.5 mg on Mon. Wed. Fri. And Sunday Take in the Morning  0 01/15/2018 at Unknown time    Assessment: 76 y/o male on Coumadin 2.5mg  daily except for 5mg  on TTSat PTA for Afib. INR was 2.66 on admit. Kcentra given due to epidural hematoma. Restarting Coumadin on 8/19 with no bridge.   INR is therapeutic today at 2.73 with holding warfarin x3 days. Hgb stable at 13.2 today.   PO intake  low but has slightly increased today.   No s/s of bleeding reported by nurse.   Goal of Therapy:  INR 2-3 Monitor platelets by anticoagulation protocol: Yes   Plan:  Restart Warfarin 2.5mg  x1 dose tonight  Will continue to monitor daily INR, CBC, s/s of bleeding, and PO intake  Gwynneth Albright, [place] D PGY1 Pharmacy Resident  Phone number [phone] 02/05/2018 8:39 AM
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
## 2018-02-05 NOTE — Progress Notes (Signed)
Speech Language Pathology Daily Session Note  Patient Details  Name: Brandon [name] MRN: [id number] Date of Birth: Aug 21, 1940  Today's Date: 02/05/2018 SLP Individual Time: 1000-1030 SLP Individual Time Calculation (min): 30 min  Short Term Goals: Week 3: SLP Short Term Goal 1 (Week 3): Pt will sustain attention to basic familiar tasks for ~ 10 minutes with Mod A cues.  SLP Short Term Goal 2 (Week 3): Pt will utilize speech intelligibility strategies to improve speech intelligibility to ~90% at the sentence level given Mod A cues. SLP Short Term Goal 3 (Week 3): Given 2 choices related to orientation information, pt will select correct choice in 8 of 10 opportunities with Mod A cues.   Skilled Therapeutic Interventions:Skilled ST services focused on cognitive skills. Pt was asleep upon entering room and required max A verbal and tactile cues for arousal. SLP facilitated orientation to place an day of week and sustained attention skills, pt required mod A verbal cues for 70% accuracy for orientation given visual and binary choice cues and mod A verbal cues for sustained attention. Pt continues to demonstrate language of confusion and confabulations. Pt requested to sleep and for therapist to leave. SLP provided thorough explanation of need for skilled ST services and impairment mentation, however pt stated " I don't care I am done" and closed eyes. treatment session ended earily due to refusal and fatigue.Pt was left in room with call bell within reach and bed alarm set. Recommend to continue skilled ST services.      Function:  Eating Eating                 Cognition Comprehension Comprehension assist level: Understands basic 25 - 49% of the time/ requires cueing 50 - 75% of the time  Expression   Expression assist level: Expresses basic 25 - 49% of the time/requires cueing 50 - 75% of the time. Uses single words/gestures.  Social Interaction Social Interaction assist level: Interacts  appropriately 25 - 49% of time - Needs frequent redirection.  Problem Solving Problem solving assist level: Solves basic 25 - 49% of the time - needs direction more than half the time to initiate, plan or complete simple activities  Memory Memory assist level: Recognizes or recalls less than 25% of the time/requires cueing greater than 75% of the time    Pain Pain Assessment Pain Score: 0-No pain  Therapy/Group: Individual Therapy  Jawad Wiacek  [facility] 02/05/2018, 10:33 AM

## 2018-02-05 NOTE — Progress Notes (Signed)
Occupational Therapy Session Note  Patient Details  Name: Brandon [name] MRN: [id number] Date of Birth: 1940-06-20  Today's Date: 02/05/2018 OT Individual Time: 1100-1155 OT Individual Time Calculation (min): 55 min    Short Term Goals: Week 1:  OT Short Term Goal 1 (Week 1): Pt will initiate washing 1 body area during ADL session  OT Short Term Goal 1 - Progress (Week 1): Met OT Short Term Goal 2 (Week 1): Pt will complete UB dressing EOB with 1 helper to improve balance OT Short Term Goal 2 - Progress (Week 1): Progressing toward goal OT Short Term Goal 3 (Week 1): Pt will complete 1 grooming task with AE as needed and Mod A OT Short Term Goal 3 - Progress (Week 1): Progressing toward goal Week 2:  OT Short Term Goal 1 (Week 2): Pt will initiate washing 2 body areas during ADL session  OT Short Term Goal 2 (Week 2): Pt will scan for 1 ADL item with mod vcs during grooming tasks OT Short Term Goal 3 (Week 2): Pt will complete BSC transfer with 2 assist    Skilled Therapeutic Interventions/Progress Updates:    Pt received in bed and initially was quite lethargic c/o headache, shoulder pain, feet pain.  Went to talk to RN about pain and when returned to pt's room he was awake and talking about a tv show. Pt already had TEDs, ACE wrap and abdominal binder on. Donned his pants for pt with pt working on rolling with max A.  LE PROM to stretch hips to reduce tone.  Pt sat to EOB and worked on sitting posture with input through low back to move from posterior pelvic tilt to anterior and with 2nd person assisting with supporting balance while pt worked on active trunk extension.  Integrated lateral leans with trunk extension. Pt then was transferred to w/c with sliding board with max A- total A of 2.  Once pt in chair, pt c/o B shoulder pain.  Attempted gentle scapular glides but pt felt this made his pain worse.  Positioned arms on pillows to support his arms.  A/AROM for B UE focusing on elbow flex  and extension control.  Pt resting in tilted back position in TIS chair with soft call alarm on his chest.   Therapy Documentation Precautions:  Precautions Precautions: Fall, Cervical Required Braces or Orthoses: Cervical Brace Cervical Brace: Hard collar, Other (comment)(Don/doff in sitting. Ok to doff in bed, when ambulating to bathroom, and when showering per MD order) Restrictions Weight Bearing Restrictions: No    Vital Signs: Therapy Vitals Temp: (!) 97.5 F (36.4 C) Pulse Rate: 75 Resp: 18 BP: 128/89 Patient Position (if appropriate): Lying Oxygen Therapy SpO2: 98 % O2 Device: Room Air Pain: Pain Assessment Pain Scale: 0-10 Pain Score: 7  Pain Type: Acute pain Pain Location: Neck Pain Orientation: Posterior Pain Descriptors / Indicators: Aching Pain Onset: On-going Patients Stated Pain Goal: 0 Pain Intervention(s): Medication (See eMAR) ADL: ADL ADL Comments: Please see functional navigator for ADL status  See Function Navigator for Current Functional Status.   Therapy/Group: Individual Therapy  [facility] 02/05/2018, 9:39 AM

## 2018-02-05 NOTE — Progress Notes (Signed)
Physical Therapy Session Note  Patient Details  Name: Brandon [name] MRN: [id number] Date of Birth: July 22, 1940  Today's Date: 02/05/2018 PT Individual Time: 1500-1543 PT Individual Time Calculation (min): 43 min   Short Term Goals: Week 3:  PT Short Term Goal 1 (Week 3): Patient to be able to maintain sitting balance with ModAx1  PT Short Term Goal 2 (Week 3): Patient to be able to perform sliding board transfer with assist of +1 consistently  PT Short Term Goal 3 (Week 3): Patient to initiate standing with MaxA and LRAD  PT Short Term Goal 4 (Week 3): Patient to be able to maintain consistent conversation and focus on simple tasks with therapist for at least 30 minute period without becoming fatigued or distracted   Skilled Therapeutic Interventions/Progress Updates:    Pt seated in TIS w/c upon PT arrival, agreeable to therapy tx and reports pain 5/10 in cervical region. Pt transported to the gym. Therapist performed manual strretches for LE hamstrings, adductors and heel cords, 2 x 30 seconds each working on ROM. Pt performed modified PNF while reclined in TIS, D2 flexion/extension with manual resistance, 2 x 10 per LE for neuro re-ed. Pt performed partial LAQ with verbal cues to kick therapist's hand, x 10 each LE for strengthening. Pt transferred back to bed with slideboard and max assist +2. Transferred to supine with max assist, left with needs in reach and bed alarm set.   Therapy Documentation Precautions:  Precautions Precautions: Fall, Cervical Required Braces or Orthoses: Cervical Brace Cervical Brace: Hard collar, Other (comment)(Don/doff in sitting. Ok to doff in bed, when ambulating to bathroom, and when showering per MD order) Restrictions Weight Bearing Restrictions: No   See Function Navigator for Current Functional Status.   Therapy/Group: Individual Therapy  Cresenciano Genre, PT, DPT 02/05/2018, 11:51 AM

## 2018-02-06 ENCOUNTER — Inpatient Hospital Stay (HOSPITAL_COMMUNITY): Payer: Medicare Other | Admitting: Speech Pathology

## 2018-02-06 ENCOUNTER — Inpatient Hospital Stay (HOSPITAL_COMMUNITY): Payer: Medicare Other | Admitting: Occupational Therapy

## 2018-02-06 ENCOUNTER — Inpatient Hospital Stay (HOSPITAL_COMMUNITY): Payer: Medicare Other | Admitting: Physical Therapy

## 2018-02-06 LAB — CBC
HEMATOCRIT: 39.5 % (ref 39.0–52.0)
HEMOGLOBIN: 13.2 g/dL (ref 13.0–17.0)
MCH: 31.8 pg (ref 26.0–34.0)
MCHC: 33.4 g/dL (ref 30.0–36.0)
MCV: 95.2 fL (ref 78.0–100.0)
PLATELETS: 347 10*3/uL (ref 150–400)
RBC: 4.15 MIL/uL — AB (ref 4.22–5.81)
RDW: 12.6 % (ref 11.5–15.5)
WBC: 6.8 10*3/uL (ref 4.0–10.5)

## 2018-02-06 LAB — PROTIME-INR
INR: 2.64
Prothrombin Time: 28 seconds — ABNORMAL HIGH (ref 11.4–15.2)

## 2018-02-06 MED ORDER — WARFARIN SODIUM 2.5 MG PO TABS
2.5000 mg | ORAL_TABLET | Freq: Once | ORAL | Status: AC
  Administered 2018-02-06: 2.5 mg via ORAL
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
## 2018-02-06 NOTE — Patient Care Conference (Signed)
Inpatient RehabilitationTeam Conference and Plan of Care Update Date: 02/06/2018   Time: 10:10 AM    Patient Name: Brandon [name]      Medical Record Number: [id number]  Date of Birth: [birth year] Sex: Male         Room/Bed: 4W11C/4W11C-01 Payor Info: Payor: MEDICARE / Plan: MEDICARE PART A AND B / Product Type: *No Product type* /    Admitting Diagnosis: Trauma SCi Mild TBI  Admit Date/Time:  01/19/2018  4:39 PM Admission Comments: No comment available   Primary Diagnosis:  <principal problem not specified> Principal Problem: <principal problem not specified>  Patient Active Problem List   Diagnosis Date Noted  . Dysphagia   . Neurogenic bowel   . Neurogenic bladder   . Essential hypertension   . PAF (paroxysmal atrial fibrillation) (HCC)   . Postoperative pain   . 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 Injury of cervical spine (HCC) 01/15/2018  . 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 Obstructive sleep apnea 01/31/2014    Expected Discharge Date: Expected Discharge Date: (SNF is plan;  2 weeks)  Team Members Present: Physician leading conference: Dr. Maryla Morrow Social Worker Present: Amada Jupiter, LCSW Nurse Present: Carmie End, RN PT Present: Teodoro Kil, PT OT Present: Callie Fielding, OT SLP Present: Jackalyn Lombard, SLP PPS Coordinator present : Tora Duck, RN, CRRN     Current Status/Progress Goal Weekly Team Focus  Medical   Intermittent hallucinations, per neuropsych these may be more illusions, patient also with orthostatic hypotension  Improve cognition and participation in therapy  Reduce blood pressure medications, increase fluid IV route   Bowel/Bladder   Total assist with bowl/bladder, I&O cath q 8hrs LBM 02/05/18  To be able to have normal bowel/bladder function with mod assist  continue to monitor bowel/bladder function q shift and as needed   Swallow/Nutrition/  Hydration   discontinued dysphagia goals due to mentation, will re-initiate as appropriate          ADL's   still total A of 2 for slide board transfers and self care from bed level. Pt continues to have defictis in the area of alertness  mod A overall   trunk control, cognition, NMR, functional mobility/transfers, balance, pt/family education - family conf scheduled 9/6   Mobility   total +2 for transfers, minimal improvements in sitting balance, still confused esp at night/in room  lofty mod A for transfers, ? power chair versus dependent w/c option (likely will need TBD at SNF)  sitting balance, transfers, standing tolerance as able   Communication   language of confusion, improving intelligibility   mod assist   continue to address intelligibility and coherent verbal output   Safety/Cognition/ Behavioral Observations  Max assist   Total  continue to address basic cognition: sustained attention, orientation, problem solving, recall    Pain   Complains of pain, Tylenol scheduled  <2  Assess and treat pain q shift and as needed   Skin   Surgical incision to the neck with foam dgs, aspen collar all the time, rediness to the butt, barrer cream  Maintain skin integrity with mod assist  assess skin q shift and as needed    Rehab Goals Patient on target to meet rehab goals: No Rehab Goals Revised: limited progress being made *See Care Plan and progress notes for long and short-term goals.     Barriers to Discharge  Current Status/Progress Possible Resolutions Date  Resolved   Physician    Medical stability;Neurogenic Bowel & Bladder;Nutrition means     Poor progress in therapy due to reduced level of alertness  Blood pressure management as above      Nursing                  PT                    OT                  SLP                SW                Discharge Planning/Teaching Needs:   d/c plan to be SNF due to goals and daughter's wishes to pursue SNF in order to continue  with daily therapies.  NA   Team Discussion:  ?still with some hallucinations but decreasing;  Still confused but also improved.  May need to recheck UA;  ?bladder spasms? Still +2 to slideboard transfers and pt very passive with txs.  Little change with rolling in bed.  Team still feels that goal for +1 assist is reasonable.  Mentation remains a large factor with progress.  Plan for SNF.  Revisions to Treatment Plan:  NA    Continued Need for Acute Rehabilitation Level of Care: The patient requires daily medical management by a physician with specialized training in physical medicine and rehabilitation for the following conditions: Daily direction of a multidisciplinary physical rehabilitation program to ensure safe treatment while eliciting the highest outcome that is of practical value to the patient.: Yes Daily analysis of laboratory values and/or radiology reports with any subsequent need for medication adjustment of medical intervention for : Neurological problems;Post surgical problems;Mood/behavior problems;Blood pressure problems;Nutritional problems   I attest that I was present, lead the team conference, and concur with the assessment and plan of the team. Maryla Morrow, MD  Alonza Bogus, Stpehanie Montroy 02/07/2018, 1:33 PM

## 2018-02-06 NOTE — Progress Notes (Signed)
ANTICOAGULATION CONSULT NOTE - Follow Up Consult  Pharmacy Consult for warfarin Indication: atrial fibrillation  Allergies  Allergen Reactions  . Flecainide Palpitations and Other (See Comments)    TACHYCARDIA SYNCOPE > LOC  . Sulfamethoxazole Other (See Comments)    Internal bleeding    Patient Measurements: Height: 6\' 1"  (185.4 cm) Weight: 186 lb 1.1 oz (84.4 kg) IBW/kg (Calculated) : 79.9  Vital Signs: Temp: 98.1 F (36.7 C) (09/03 0536) BP: 98/75 (09/03 0820) Pulse Rate: 75 (09/03 0820)  Labs: Recent Labs    02/04/18 0615 02/05/18 0505 02/06/18 0606  HGB 13.2  --  13.2  HCT 39.8  --  39.5  PLT 372  --  347  LABPROT 32.7* 28.7* 28.0*  INR 3.23 2.73 2.64  CREATININE  --  0.86  --     Estimated Creatinine Clearance: 83.9 mL/min (by C-G formula based on SCr of 0.86 mg/dL).   Medical History: Past Medical History:  Diagnosis Date  . Atrial fibrillation (HCC)     Medications:  Medications Prior to Admission  Medication Sig Dispense Refill Last Dose  . escitalopram (LEXAPRO) 10 MG tablet Take 10 mg by mouth daily.  0 01/15/2018 at Unknown time  . lisinopril (PRINIVIL,ZESTRIL) 20 MG tablet Take 20 mg by mouth daily.  2 01/15/2018 at Unknown time  . metoprolol tartrate (LOPRESSOR) 50 MG tablet Take 75 mg by mouth daily.   0 01/15/2018 at Unknown time  . tamsulosin (FLOMAX) 0.4 MG CAPS capsule Take 0.4 mg by mouth daily with supper.  2 01/15/2018 at Unknown time  . warfarin (COUMADIN) 5 MG tablet Take 2.5-5 mg by mouth See admin instructions. Take 5 mg on Tues, Thurs. Saturday Take 2.5 mg on Mon. Wed. Fri. And Sunday Take in the Morning  0 01/15/2018 at Unknown time    Assessment: 77 y/o male on Coumadin 2.5mg  daily except for 5mg  on TTSat PTA for Afib. INR was 2.66 on admit. Kcentra given due to epidural hematoma. Restarting Coumadin on 8/19 with no bridge.   INR is therapeutic today at 2.64  PO intake low but has slightly increased today.   No s/s of bleeding  reported by nurse.   Goal of Therapy:  INR 2-3 Monitor platelets by anticoagulation protocol: Yes   Plan:  Warfarin 2.5mg  x1 dose tonight  Will continue to monitor daily INR, CBC, s/s of bleeding, and PO intake  Ulyses Southward, PharmD, [place], AAHIVP, CPP Infectious Disease Pharmacist Pager: [phone] 02/06/2018 9:06 AM

## 2018-02-06 NOTE — Progress Notes (Signed)
Physical Therapy Session Note  Patient Details  Name: Brandon [name] MRN: [id number] Date of Birth: 01-23-1941  Today's Date: 02/06/2018 PT Individual Time: 1500-1550 PT Individual Time Calculation (min): 50 min   Short Term Goals: Week 3:  PT Short Term Goal 1 (Week 3): Patient to be able to maintain sitting balance with ModAx1  PT Short Term Goal 2 (Week 3): Patient to be able to perform sliding board transfer with assist of +1 consistently  PT Short Term Goal 3 (Week 3): Patient to initiate standing with MaxA and LRAD  PT Short Term Goal 4 (Week 3): Patient to be able to maintain consistent conversation and focus on simple tasks with therapist for at least 30 minute period without becoming fatigued or distracted   Skilled Therapeutic Interventions/Progress Updates:    no c/o pain, pt awake in bed on arrival, confused and alert, but becomes somnolent with change in position to sitting.  +2 for slide board transfer to w/c to attempt to increase arousal.  BP assessed in sitting and 84/70.  See below for full details of BP throughout session.  Pt unable to tolerate upright positions as noted by drop in BP (despite application of thigh high teds, ace wraps from feet>thighs, and abdominal binder), and immediate decrease in arousal with periods of non-responsiveness even to pain.  Transition to supine in tilt table, and increased to 25 degrees>40 degrees.  Pt continues to be somnolent throughout session, essentially non-responsive when upright.  Transferred back to bed via hoyer at end of session due to safety.  RN and PA aware of pt status and to f/u with MD.  Missed 40 minutes of skilled PT due to blood pressure drop and pt unable to participate.   BP throughout session with thigh high teds, thigh high aces wraps, and abdominal binder: Sitting: 84/70 25 degrees on tilt table: 112/82 25 degrees on tilt table after 3 minutes: 95/69 40 degrees on tilt table: 70/48 20 degrees on tilt table:  77/60  Therapy Documentation Precautions:  Precautions Precautions: Fall, Cervical Required Braces or Orthoses: Cervical Brace Cervical Brace: Hard collar, Other (comment)(Don/doff in sitting. Ok to doff in bed, when ambulating to bathroom, and when showering per MD order) Restrictions Weight Bearing Restrictions: No General: PT Amount of Missed Time (min): 40 Minutes PT Missed Treatment Reason: Patient fatigue;Other (Comment)(BP)   See Function Navigator for Current Functional Status.   Therapy/Group: Individual Therapy  Stephania Fragmin 02/06/2018, 4:20 PM

## 2018-02-06 NOTE — Progress Notes (Signed)
Speech Language Pathology Daily Session Note  Patient Details  Name: Brandon [name] MRN: [id number] Date of Birth: 05/21/1941  Today's Date: 02/06/2018 SLP Individual Time: 1305-1340 SLP Individual Time Calculation (min): 35 min  Short Term Goals: Week 3: SLP Short Term Goal 1 (Week 3): Pt will sustain attention to basic familiar tasks for ~ 10 minutes with Mod A cues.  SLP Short Term Goal 2 (Week 3): Pt will utilize speech intelligibility strategies to improve speech intelligibility to ~90% at the sentence level given Mod A cues. SLP Short Term Goal 3 (Week 3): Given 2 choices related to orientation information, pt will select correct choice in 8 of 10 opportunities with Mod A cues.   Skilled Therapeutic Interventions:  Pt was seen for skilled ST targeting cognitive goals.  Upon arrival, pt was soundly asleep while seated upright in wheelchair.  Pt was unarousable to loud voice, sternal rub, or cold compress.  Pt was taken out of room in the hopes of improving alertness without success.  Once returned to room, SLP provided total assist for oral care to continue attempts for increased alertness.  Pt would open his mouth to accept oral care and spit out residual toothpaste from oral cavity but began snoring once therapist stopped brushing pt's teeth.  Pt was transferred back to bed with +3 via [place] due to somnolence.  Pt was left with RN and nurse tech at bedside.  Continue per current plan of care.    Function:  Eating Eating                 Cognition Comprehension Comprehension assist level: Understands basic less than 25% of the time/ requires cueing >75% of the time  Expression   Expression assist level: Expresses basis less than 25% of the time/requires cueing >75% of the time.  Social Interaction Social Interaction assist level: Interacts appropriately less than 25% of the time. May be withdrawn or combative.  Problem Solving Problem solving assist level: Solves basic less than  25% of the time - needs direction nearly all the time or does not effectively solve problems and may need a restraint for safety  Memory Memory assist level: Recognizes or recalls less than 25% of the time/requires cueing greater than 75% of the time    Pain Pain Assessment Pain Score: Asleep  Therapy/Group: Individual Therapy  Kalik Hoare, Joni Reining L 02/06/2018, 1:40 PM

## 2018-02-06 NOTE — Progress Notes (Signed)
Physical Therapy Session Note  Patient Details  Name: Brandon [name] MRN: [id number] Date of Birth: 01/05/1941  Today's Date: 02/06/2018 PT Individual Time: 1030-1130 PT Individual Time Calculation (min): 60 min   Short Term Goals: Week 3:  PT Short Term Goal 1 (Week 3): Patient to be able to maintain sitting balance with ModAx1  PT Short Term Goal 2 (Week 3): Patient to be able to perform sliding board transfer with assist of +1 consistently  PT Short Term Goal 3 (Week 3): Patient to initiate standing with MaxA and LRAD  PT Short Term Goal 4 (Week 3): Patient to be able to maintain consistent conversation and focus on simple tasks with therapist for at least 30 minute period without becoming fatigued or distracted   Skilled Therapeutic Interventions/Progress Updates:    pt grimaces occasionally throughout session when RUE moved, monitored throughout session.  Pt somnolent<>lethargic throughout session with very few moments of alertness.  Session focus on arousal, following 1-step commands, and pec stretch.    Attempted self feeding task at high/low table for pt initiation and arousal, but even with hand over hand assist pt unable to complete task 2/2 lethargy.  BP assessed in sitting at table and 75/56 in sitting upright, 108/78 in reclined position.  Transition to sitting on therapy mat with +2 assist for slide board transfer, BP in sitting 79/57, transition to supine with +2 assist and BP 119/73.  Attempted rolling on mat, total assist with pt not initiating or opening eyes.  Able to roll L side lying>supine>R side lying with max assist 1 attempt each direction.  In supine, extended pec stretch for pain control and improved sitting tolerance/posture.  Returned to w/c in same manner as above, positioned in room with chair alarm intact, call bell in reach and needs met.   Therapy Documentation Precautions:  Precautions Precautions: Fall, Cervical Required Braces or Orthoses: Cervical  Brace Cervical Brace: Hard collar, Other (comment)(Don/doff in sitting. Ok to doff in bed, when ambulating to bathroom, and when showering per MD order) Restrictions Weight Bearing Restrictions: No   See Function Navigator for Current Functional Status.   Therapy/Group: Individual Therapy   E  02/06/2018, 12:18 PM  

## 2018-02-06 NOTE — Progress Notes (Signed)
Social Work Patient ID: Brandon [name], male   DOB: 1940/06/24, 77 y.o.   MRN: [id number]  Have reviewed team conference with pt's daughter, Clydie Braun today.  She understands that team feels pt is making some improvement in overall mentation, however, this continues to be a limiting factor in his progress and ability to fully engage in therapies.  She is aware that we have projected ~ 2 more weeks for CIR then plan transition to SNF.  Daughter continues to have concerns about "Why" pt still with significant cognitive deficits.  We do have a family conference planned for this Friday at 9am and will discuss further with MD present as well.  [name], LCSW

## 2018-02-06 NOTE — Plan of Care (Signed)
  Problem: Consults Goal: RH SPINAL CORD INJURY PATIENT EDUCATION Description  See Patient Education module for education specifics.  Outcome: Progressing Goal: Skin Care Protocol Initiated - if Braden Score 18 or less Description If consults are not indicated, leave blank or document N/A Outcome: Progressing   Problem: SCI BOWEL ELIMINATION Goal: RH STG MANAGE BOWEL WITH ASSISTANCE Description STG Manage Bowel with mod Assistance.  Outcome: Progressing Goal: RH STG SCI MANAGE BOWEL PROGRAM W/ASSIST OR AS APPROPRIATE Description STG SCI Manage bowel program w/mod assist or as appropriate.  Outcome: Progressing   Problem: SCI BLADDER ELIMINATION Goal: RH STG MANAGE BLADDER WITH ASSISTANCE Description STG Manage Bladder With mod Assistance  Outcome: Progressing Goal: RH STG MANAGE BLADDER WITH EQUIPMENT WITH ASSISTANCE Description STG Manage Bladder With Equipment With mod Assistance  Outcome: Progressing Goal: RH STG SCI MANAGE BLADDER PROGRAM W/ASSISTANCE Description Mod assist  Outcome: Progressing   Problem: RH SKIN INTEGRITY Goal: RH STG SKIN FREE OF INFECTION/BREAKDOWN Description Patients skin will remain free from further infection or breakdown with mod assist.  Outcome: Progressing Goal: RH STG MAINTAIN SKIN INTEGRITY WITH ASSISTANCE Description STG Maintain Skin Integrity With mod Assistance.  Outcome: Progressing Goal: RH STG ABLE TO PERFORM INCISION/WOUND CARE W/ASSISTANCE Description STG Able To Perform Incision/Wound Care With total Assistance from caregiver .  Outcome: Progressing   Problem: RH SAFETY Goal: RH STG ADHERE TO SAFETY PRECAUTIONS W/ASSISTANCE/DEVICE Description STG Adhere to Safety Precautions With mod Assistance/Device.  Outcome: Progressing   Problem: RH PAIN MANAGEMENT Goal: RH STG PAIN MANAGED AT OR BELOW PT'S PAIN GOAL Description < 4  Outcome: Progressing   Problem: RH KNOWLEDGE DEFICIT SCI Goal: RH STG INCREASE KNOWLEDGE  OF SELF CARE AFTER SCI Description Mod assist  Outcome: Progressing   

## 2018-02-06 NOTE — Progress Notes (Signed)
Occupational Therapy Session Note  Patient Details  Name: Brandon [name] MRN: [id number] Date of Birth: April 16, 1941  Today's Date: 02/06/2018 OT Individual Time: 8546-2703 OT Individual Time Calculation (min): 59 min  and Today's Date: 02/06/2018 OT Missed Time: 15 Minutes Missed Time Reason: Patient fatigue   Short Term Goals: Week 2:  OT Short Term Goal 1 (Week 2): Pt will initiate washing 2 body areas during ADL session  OT Short Term Goal 2 (Week 2): Pt will scan for 1 ADL item with mod vcs during grooming tasks OT Short Term Goal 3 (Week 2): Pt will complete BSC transfer with 2 assist  Skilled Therapeutic Interventions/Progress Updates:    Upon entering the room, pt supine in bed sleeping soundly. Pt is very lethargic throughout session and requiring max multimodal cues for alertness. OT provided total A to don B thigh high TEDs , abdominal binder, and ACE wraps. Pt unaware of bowel incontinence with pt requiring total of 2 to roll, hygiene, and don LE clothing. Supine >sit with total A to EOB. Pt needing max A - total A static sitting balance. Pt unable to verbalize placement of slide board. OT required second helper for board placement and +2 assistance for slide board transfer into wheelchair. Pt not assisting with transfer even when cued to do so. Once set up in wheelchair, pt sleeping soundly again and therapist unable to arouse pt for participation. Pt placed at RN station via wheelchair for safety. Alarm belt donned for safety and all needs within reach. 15 missed minutes secondary to lethargy.  Therapy Documentation Precautions:  Precautions Precautions: Fall, Cervical Required Braces or Orthoses: Cervical Brace Cervical Brace: Hard collar, Other (comment)(Don/doff in sitting. Ok to doff in bed, when ambulating to bathroom, and when showering per MD order) Restrictions Weight Bearing Restrictions: No General: General OT Amount of Missed Time: 15 Minutes Vital Signs: Therapy  Vitals Temp: 98.1 F (36.7 C) Pulse Rate: 75 Resp: 16 BP: 98/75 Patient Position (if appropriate): Lying Oxygen Therapy SpO2: 99 % O2 Device: Room Air Pain: Pain Assessment Pain Scale: 0-10 Pain Score: 0-No pain Pain Type: Acute pain Pain Location: Neck Pain Orientation: Posterior Pain Descriptors / Indicators: Aching Pain Onset: On-going Patients Stated Pain Goal: 1 Pain Intervention(s): Medication (See eMAR) ADL: ADL ADL Comments: Please see functional navigator for ADL status  See Function Navigator for Current Functional Status.   Therapy/Group: Individual Therapy  Alen Bleacher 02/06/2018, 9:17 AM

## 2018-02-07 ENCOUNTER — Inpatient Hospital Stay (HOSPITAL_COMMUNITY): Payer: Medicare Other | Admitting: Speech Pathology

## 2018-02-07 ENCOUNTER — Inpatient Hospital Stay (HOSPITAL_COMMUNITY): Payer: Medicare Other | Admitting: Occupational Therapy

## 2018-02-07 ENCOUNTER — Encounter (HOSPITAL_COMMUNITY): Payer: Medicare Other | Admitting: Psychology

## 2018-02-07 DIAGNOSIS — G4730 Sleep apnea, unspecified: Secondary | ICD-10-CM

## 2018-02-07 DIAGNOSIS — R442 Other hallucinations: Secondary | ICD-10-CM

## 2018-02-07 DIAGNOSIS — G4710 Hypersomnia, unspecified: Secondary | ICD-10-CM

## 2018-02-07 LAB — BASIC METABOLIC PANEL
Anion gap: 8 (ref 5–15)
BUN: 24 mg/dL — AB (ref 8–23)
CO2: 24 mmol/L (ref 22–32)
CREATININE: 0.77 mg/dL (ref 0.61–1.24)
Calcium: 8.8 mg/dL — ABNORMAL LOW (ref 8.9–10.3)
Chloride: 105 mmol/L (ref 98–111)
Glucose, Bld: 118 mg/dL — ABNORMAL HIGH (ref 70–99)
POTASSIUM: 3.8 mmol/L (ref 3.5–5.1)
SODIUM: 137 mmol/L (ref 135–145)

## 2018-02-07 LAB — CBC
HCT: 39.2 % (ref 39.0–52.0)
Hemoglobin: 13.5 g/dL (ref 13.0–17.0)
MCH: 32.5 pg (ref 26.0–34.0)
MCHC: 34.4 g/dL (ref 30.0–36.0)
MCV: 94.2 fL (ref 78.0–100.0)
Platelets: 331 10*3/uL (ref 150–400)
RBC: 4.16 MIL/uL — ABNORMAL LOW (ref 4.22–5.81)
RDW: 12.5 % (ref 11.5–15.5)
WBC: 6.2 10*3/uL (ref 4.0–10.5)

## 2018-02-07 LAB — PROTIME-INR
INR: 2.75
PROTHROMBIN TIME: 28.9 s — AB (ref 11.4–15.2)

## 2018-02-07 MED ORDER — SODIUM CHLORIDE 0.9 % IV BOLUS
500.0000 mL | Freq: Once | INTRAVENOUS | Status: AC
  Administered 2018-02-07: 500 mL via INTRAVENOUS

## 2018-02-07 MED ORDER — WARFARIN SODIUM 2.5 MG PO TABS
2.5000 mg | ORAL_TABLET | Freq: Once | ORAL | Status: AC
  Administered 2018-02-07: 2.5 mg via ORAL
  Filled 2018-02-07: qty 1

## 2018-02-07 MED ORDER — LISINOPRIL 10 MG PO TABS
10.0000 mg | ORAL_TABLET | Freq: Every day | ORAL | Status: DC
  Administered 2018-02-08 – 2018-02-27 (×17): 10 mg via ORAL
  Filled 2018-02-07 (×20): qty 1

## 2018-02-07 MED ORDER — METOPROLOL TARTRATE 12.5 MG HALF TABLET
12.5000 mg | ORAL_TABLET | Freq: Two times a day (BID) | ORAL | Status: DC
  Administered 2018-02-07 – 2018-02-27 (×37): 12.5 mg via ORAL
  Filled 2018-02-07 (×40): qty 1

## 2018-02-07 MED ORDER — SODIUM CHLORIDE 0.9 % IV SOLN: 75.0000 mL/h | INTRAVENOUS | Status: DC

## 2018-02-07 MED ORDER — POTASSIUM CHLORIDE IN NACL 20-0.9 MEQ/L-% IV SOLN
INTRAVENOUS | Status: DC
  Administered 2018-02-07: 75 mL/h via INTRAVENOUS
  Administered 2018-02-08 – 2018-02-11 (×10): via INTRAVENOUS
  Filled 2018-02-07 (×9): qty 1000

## 2018-02-07 MED ORDER — SODIUM CHLORIDE 0.9 % IV SOLN: INTRAVENOUS | Status: DC

## 2018-02-07 NOTE — Progress Notes (Signed)
Pt is placed on overnight pulse ox. RT will continue to monitor as needed.

## 2018-02-07 NOTE — Progress Notes (Signed)
Occupational Therapy Note  Patient Details  Name: Brandon [name] MRN: [id number] Date of Birth: 1941-02-06  Today's Date: 02/07/2018 OT Missed Time: 60 Minutes Missed Time Reason: Patient on bedrest  Pt missed 60 min of therapy time as pt is on bedrest. Physician orders placed at 950.   Erine Phenix 02/07/2018, 11:14 AM

## 2018-02-07 NOTE — Progress Notes (Signed)
ANTICOAGULATION CONSULT NOTE - Follow Up Consult  Pharmacy Consult for warfarin Indication: atrial fibrillation  Allergies  Allergen Reactions  . Flecainide Palpitations and Other (See Comments)    TACHYCARDIA SYNCOPE > LOC  . Sulfamethoxazole Other (See Comments)    Internal bleeding    Patient Measurements: Height: 6\' 1"  (185.4 cm) Weight: 186 lb 1.1 oz (84.4 kg) IBW/kg (Calculated) : 79.9  Vital Signs: Temp: 98.2 F (36.8 C) (09/04 0435) Temp Source: Oral (09/04 0435) BP: 108/72 (09/04 0840) Pulse Rate: 71 (09/04 0840)  Labs: Recent Labs    02/05/18 0505 02/06/18 0606 02/07/18 0547  HGB  --  13.2 13.5  HCT  --  39.5 39.2  PLT  --  347 331  LABPROT 28.7* 28.0* 28.9*  INR 2.73 2.64 2.75  CREATININE 0.86  --   --     Estimated Creatinine Clearance: 83.9 mL/min (by C-G formula based on SCr of 0.86 mg/dL).   Medical History: Past Medical History:  Diagnosis Date  . Atrial fibrillation (HCC)     Medications:  Medications Prior to Admission  Medication Sig Dispense Refill Last Dose  . escitalopram (LEXAPRO) 10 MG tablet Take 10 mg by mouth daily.  0 01/15/2018 at Unknown time  . lisinopril (PRINIVIL,ZESTRIL) 20 MG tablet Take 20 mg by mouth daily.  2 01/15/2018 at Unknown time  . metoprolol tartrate (LOPRESSOR) 50 MG tablet Take 75 mg by mouth daily.   0 01/15/2018 at Unknown time  . tamsulosin (FLOMAX) 0.4 MG CAPS capsule Take 0.4 mg by mouth daily with supper.  2 01/15/2018 at Unknown time  . warfarin (COUMADIN) 5 MG tablet Take 2.5-5 mg by mouth See admin instructions. Take 5 mg on Tues, Thurs. Saturday Take 2.5 mg on Mon. Wed. Fri. And Sunday Take in the Morning  0 01/15/2018 at Unknown time    Assessment: 77 y/o male on Coumadin 2.5mg  daily except for 5mg  on TTSat PTA for Afib. INR was 2.66 on admit. Kcentra given due to epidural hematoma. Restarting Coumadin on 8/19 with no bridge.   INR is therapeutic today at 2.75 and looks to be stabilizing.   PO  intake low but has slightly increased today.   No s/s of bleeding reported by nurse.   Goal of Therapy:  INR 2-3 Monitor platelets by anticoagulation protocol: Yes   Plan:  Warfarin 2.5mg  x1 dose tonight  Will continue to monitor daily INR, CBC, s/s of bleeding, and PO intake  Ulyses Southward, PharmD, [place], AAHIVP, CPP Infectious Disease Pharmacist Pager: [phone] 02/07/2018 8:52 AM

## 2018-02-07 NOTE — Progress Notes (Signed)
Speech Language Pathology Daily Session Note  Patient Details  Name: Brandon [name] MRN: [id number] Date of Birth: June 26, 1940  Today's Date: 02/07/2018 SLP Individual Time: 0830-0900 SLP Individual Time Calculation (min): 30 min  Short Term Goals: Week 3: SLP Short Term Goal 1 (Week 3): Pt will sustain attention to basic familiar tasks for ~ 10 minutes with Mod A cues.  SLP Short Term Goal 2 (Week 3): Pt will utilize speech intelligibility strategies to improve speech intelligibility to ~90% at the sentence level given Mod A cues. SLP Short Term Goal 3 (Week 3): Given 2 choices related to orientation information, pt will select correct choice in 8 of 10 opportunities with Mod A cues.   Skilled Therapeutic Interventions: Skilled treatment session focused on basic cognition. Pt continues to exhibit worsening hallucinations (states that a little put something in his hand wants me to place it on the table). Pt requires Max A cues location and Max A cues to recall after 10 seconds. Pt also with inappropriate moments of giddiness (when blood pressure cuff inflated - pt with inappropriate laughter). Pt also required Max A cues to sustained alertness. MD aware of continued worsening of pt's condition.      Function:    Cognition Comprehension Comprehension assist level: Understands basic less than 25% of the time/ requires cueing >75% of the time  Expression   Expression assist level: Expresses basis less than 25% of the time/requires cueing >75% of the time.  Social Interaction Social Interaction assist level: Interacts appropriately less than 25% of the time. May be withdrawn or combative.  Problem Solving Problem solving assist level: Solves basic less than 25% of the time - needs direction nearly all the time or does not effectively solve problems and may need a restraint for safety  Memory Memory assist level: Recognizes or recalls less than 25% of the time/requires cueing greater than 75% of  the time    Pain    Therapy/Group: Individual Therapy  Brandon [name] 02/07/2018, 2:28 PM

## 2018-02-07 NOTE — Progress Notes (Signed)
Occupational Therapy Weekly Progress Note  Patient Details  Name: Brandon [name] MRN: [id number] Date of Birth: 05/20/1941  Beginning of progress report period: January 29, 2018 End of progress report period: February 07, 2018  Today's Date: 02/07/2018 OT Individual Time: 0700-0755  OT Individual Time Calculation (min): 55 min  Today's Date: 02/07/2018 OT Missed Time: 20 Minutes Missed Time Reason: Other (comment)(pt unable to remain alert)   Patient has met 2 of 3 short term goals. Pt's barriers this week include lethargy and hypotension limiting pt's ability to participate. Pt continues to need +2 assistance slide board transfers. OT wrapping B LEs with ACE wraps, TED hose, and use of abdominal binders. Pt requires total A LB self care, max A UB self care, and max A rolling L <> R with pt able to hook arm onto bed rail. Pt able to feed self with min A and use of universal cuff on L UE. Family conference scheduled this week for continued discharge planning.   Patient continues to demonstrate the following deficits: muscle weakness, decreased cardiorespiratoy endurance, abnormal tone, decreased coordination and decreased motor planning, decreased initiation, decreased attention, decreased awareness, decreased problem solving, decreased safety awareness, decreased memory and delayed processing and decreased sitting balance, decreased standing balance, decreased postural control and decreased balance strategies and therefore will continue to benefit from skilled OT intervention to enhance overall performance with Reduce care partner burden.  Patient not progressing toward long term goals.  See goal revision..  Plan of care revisions: goals downgraded secondary to safety concerns and pt's level of participation due to alertness.  OT Short Term Goals Week 3:  OT Short Term Goal 1 (Week 3): STGS=LTGs secondary to upcoming discharge  Skilled Therapeutic Interventions/Progress Updates:    Session 1:  Upon entering the room, pt supine in bed and difficult to arouse for participation. OT provided total A to don B LE TEDs, wraps, and LE clothing while supine in bed. Pt required increased time with mod multimodal cues to follow directions and max A to roll L  <> R to don clothing items. Pt continues to fall asleep throughout session and OT unable to keep pt alert and oriented. Pt having hallucinations during this time as well. OT notified MD and RN. 20 missed minutes secondary to lethargy. Pt remained supine in bed with call bell and all needed items within reach. Bed alarm activated.     Therapy Documentation Precautions:  Precautions Precautions: Fall, Cervical Required Braces or Orthoses: Cervical Brace Cervical Brace: Hard collar, Other (comment)(Don/doff in sitting. Ok to doff in bed, when ambulating to bathroom, and when showering per MD order) Restrictions Weight Bearing Restrictions: No General: General OT Amount of Missed Time: 20 Minutes Vital Signs: Therapy Vitals Temp: 98.2 F (36.8 C) Temp Source: Oral Pulse Rate: 74 Resp: 18 BP: 110/76 Patient Position (if appropriate): Sitting Oxygen Therapy SpO2: 98 % O2 Device: Room Air Pain:   ADL: ADL ADL Comments: Please see functional navigator for ADL status  See Function Navigator for Current Functional Status.   Therapy/Group: Individual Therapy  Gypsy Decant 02/07/2018, 8:03 AM

## 2018-02-07 NOTE — Progress Notes (Signed)
Occupational Therapy Note  Patient Details  Name: Crash Lontz MRN: [id number] Date of Birth: Jul 01, 1940  Today's Date: 02/07/2018 OT Missed Time: 30 Minutes Missed Time Reason: Patient on bedrest  Pt currently placed on bedrest and unable to participate in OT intervention at this time.   Alen Bleacher 02/07/2018, 3:41 PM
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
## 2018-02-07 NOTE — Plan of Care (Signed)
  Problem: Consults Goal: RH SPINAL CORD INJURY PATIENT EDUCATION Description  See Patient Education module for education specifics.  Outcome: Not Progressing Goal: Skin Care Protocol Initiated - if Braden Score 18 or less Description If consults are not indicated, leave blank or document N/A Outcome: Progressing   Problem: SCI BOWEL ELIMINATION Goal: RH STG MANAGE BOWEL WITH ASSISTANCE Description STG Manage Bowel with mod Assistance.  Outcome: Progressing Goal: RH STG SCI MANAGE BOWEL PROGRAM W/ASSIST OR AS APPROPRIATE Description STG SCI Manage bowel program w/mod assist or as appropriate.  Outcome: Progressing   Problem: SCI BLADDER ELIMINATION Goal: RH STG MANAGE BLADDER WITH ASSISTANCE Description STG Manage Bladder With mod Assistance  Outcome: Not Progressing Goal: RH STG MANAGE BLADDER WITH EQUIPMENT WITH ASSISTANCE Description STG Manage Bladder With Equipment With mod Assistance  Outcome: Progressing Goal: RH STG SCI MANAGE BLADDER PROGRAM W/ASSISTANCE Description Mod assist  Outcome: Progressing   Problem: RH SKIN INTEGRITY Goal: RH STG SKIN FREE OF INFECTION/BREAKDOWN Description Patients skin will remain free from further infection or breakdown with mod assist.  Outcome: Progressing Goal: RH STG MAINTAIN SKIN INTEGRITY WITH ASSISTANCE Description STG Maintain Skin Integrity With mod Assistance.  Outcome: Progressing Goal: RH STG ABLE TO PERFORM INCISION/WOUND CARE W/ASSISTANCE Description STG Able To Perform Incision/Wound Care With total Assistance from caregiver .  Outcome: Not Progressing   Problem: RH SAFETY Goal: RH STG ADHERE TO SAFETY PRECAUTIONS W/ASSISTANCE/DEVICE Description STG Adhere to Safety Precautions With mod Assistance/Device.  Outcome: Progressing   Problem: RH PAIN MANAGEMENT Goal: RH STG PAIN MANAGED AT OR BELOW PT'S PAIN GOAL Description < 4  Outcome: Progressing   Problem: RH KNOWLEDGE DEFICIT SCI Goal: RH STG INCREASE  KNOWLEDGE OF SELF CARE AFTER SCI Description Mod assist  Outcome: Not Progressing

## 2018-02-08 ENCOUNTER — Inpatient Hospital Stay (HOSPITAL_COMMUNITY): Payer: Medicare Other | Admitting: Occupational Therapy

## 2018-02-08 ENCOUNTER — Inpatient Hospital Stay (HOSPITAL_COMMUNITY): Payer: Medicare Other | Admitting: Physical Therapy

## 2018-02-08 ENCOUNTER — Inpatient Hospital Stay (HOSPITAL_COMMUNITY): Payer: Medicare Other | Admitting: Speech Pathology

## 2018-02-08 DIAGNOSIS — I951 Orthostatic hypotension: Secondary | ICD-10-CM

## 2018-02-08 DIAGNOSIS — R443 Hallucinations, unspecified: Secondary | ICD-10-CM

## 2018-02-08 LAB — CBC
HEMATOCRIT: 39.4 % (ref 39.0–52.0)
Hemoglobin: 13.4 g/dL (ref 13.0–17.0)
MCH: 32.1 pg (ref 26.0–34.0)
MCHC: 34 g/dL (ref 30.0–36.0)
MCV: 94.3 fL (ref 78.0–100.0)
Platelets: 309 10*3/uL (ref 150–400)
RBC: 4.18 MIL/uL — ABNORMAL LOW (ref 4.22–5.81)
RDW: 12.6 % (ref 11.5–15.5)
WBC: 6.7 10*3/uL (ref 4.0–10.5)

## 2018-02-08 LAB — PROTIME-INR
INR: 2.28
Prothrombin Time: 24.9 seconds — ABNORMAL HIGH (ref 11.4–15.2)

## 2018-02-08 LAB — URINALYSIS, ROUTINE W REFLEX MICROSCOPIC
BILIRUBIN URINE: NEGATIVE
GLUCOSE, UA: NEGATIVE mg/dL
KETONES UR: 20 mg/dL — AB
NITRITE: NEGATIVE
PH: 5 (ref 5.0–8.0)
PROTEIN: NEGATIVE mg/dL
Specific Gravity, Urine: 1.023 (ref 1.005–1.030)

## 2018-02-08 MED ORDER — FLUDROCORTISONE ACETATE 0.1 MG PO TABS
0.1000 mg | ORAL_TABLET | Freq: Every day | ORAL | Status: DC
  Administered 2018-02-08 – 2018-02-27 (×19): 0.1 mg via ORAL
  Filled 2018-02-08 (×20): qty 1

## 2018-02-08 MED ORDER — WARFARIN SODIUM 5 MG PO TABS
5.0000 mg | ORAL_TABLET | Freq: Once | ORAL | Status: AC
  Administered 2018-02-08: 5 mg via ORAL
  Filled 2018-02-08: qty 1

## 2018-02-08 MED ORDER — QUETIAPINE FUMARATE 25 MG PO TABS
25.0000 mg | ORAL_TABLET | Freq: Every day | ORAL | Status: DC
  Administered 2018-02-08: 25 mg via ORAL
  Filled 2018-02-08: qty 1

## 2018-02-08 NOTE — Progress Notes (Signed)
Physical Therapy Session Note  Patient Details  Name: Westlee Wayment MRN: [id number] Date of Birth: [birth year]  Today's Date: 02/08/2018 PT Individual Time: 1010-1118 PT Individual Time Calculation (min): 68 min   Short Term Goals: Week 3:  PT Short Term Goal 1 (Week 3): Patient to be able to maintain sitting balance with ModAx1  PT Short Term Goal 2 (Week 3): Patient to be able to perform sliding board transfer with assist of +1 consistently  PT Short Term Goal 3 (Week 3): Patient to initiate standing with MaxA and LRAD  PT Short Term Goal 4 (Week 3): Patient to be able to maintain consistent conversation and focus on simple tasks with therapist for at least 30 minute period without becoming fatigued or distracted   Skilled Therapeutic Interventions/Progress Updates:    no indication of pain based on faces scale.  Pt confused and hallucinating throughout session, difficult to reorient, restless, and somnolent.  Session focus on upright tolerance and orientation.    BP assessed throughout session to monitor upright tolerance, see below and separate note from today's date for details.  Pt unable to maintain arousal for more than 1 minute at a time throughout session.  PT applied thigh high TEDS/ace wraps and abdominal binder for BP management.  Transition to sitting EOB with total assist +1 with HOB elevated.  +2 for slide board transfer to w/c.  Pt continues to be somnolent, muttering nonsensically, and restless.  BP orthostatic sitting upright so reclined chair with BP stabilizing.  Remainder of session spent attempting to maintain pt arousal >1 minute with max tactile/verbal cues while tolerating 20 degrees reclined in tilt in space w/c.  Positioned in nursing station at end of session with chair fully reclined for BP control.   BP: 98/57 with HOB at 30 degrees, TEDs applied 104/74 with HOB at 30 degrees, TEDs/aces/abdominal binder applied 73/54 sitting upright in w/c 116/83 reclined fully in  w/c 81/64 w/c tilted to 20 degrees 89/64 w/c tilted to 20 degrees at 5 minutes  Therapy Documentation Precautions:  Precautions Precautions: Fall, Cervical Required Braces or Orthoses: Cervical Brace Cervical Brace: Hard collar, Other (comment)(Don/doff in sitting. Ok to doff in bed, when ambulating to bathroom, and when showering per MD order) Restrictions Weight Bearing Restrictions: No   See Function Navigator for Current Functional Status.   Therapy/Group: Individual Therapy  Stephania Fragmin 02/08/2018, 11:19 AM

## 2018-02-08 NOTE — Plan of Care (Signed)
  Problem: RH Balance Goal: LTG Patient will maintain dynamic sitting balance (PT) Description LTG:  Patient will maintain dynamic sitting balance with assistance during mobility activities (PT) Flowsheets (Taken 02/08/2018 1603) LTG: Pt will maintain dynamic sitting balance during mobility activities with:: Maximal Assistance - Patient 25 - 49% (downgraded 9/5 cw) Note:  Downgraded due to pt progress   Problem: Sit to Stand Goal: LTG:  Patient will perform sit to stand with assistance level (PT) Description LTG:  Patient will perform sit to stand with assistance level (PT) Outcome: Not Applicable Flowsheets (Taken 02/08/2018 1603) LTG: PT will perform sit to stand in preparation for functional mobility with assistance level: -- (d/c 9/5 cw) Note:  Discontinued as not a focus right now   Problem: RH Bed Mobility Goal: LTG Patient will perform bed mobility with assist (PT) Description LTG: Patient will perform bed mobility with assistance, with/without cues (PT). Flowsheets (Taken 02/08/2018 1603) LTG: Pt will perform bed mobility with assistance level of: Maximal Assistance - Patient 25 - 49% (downgraded 9/5 cw) Note:  Downgraded due to pt progress   Problem: RH Bed to Chair Transfers Goal: LTG Patient will perform bed/chair transfers w/assist (PT) Description LTG: Patient will perform bed to chair transfers with assistance (PT). Flowsheets (Taken 02/08/2018 1603) LTG: Pt will perform Bed to Chair Transfers with assistance level  : Dependent - mechanical lift (downgraded 9/5 cw) Note:  Downgraded due to pt progress   Problem: RH Car Transfers Goal: LTG Patient will perform car transfers with assist (PT) Description LTG: Patient will perform car transfers with assistance (PT). Outcome: Not Applicable Flowsheets (Taken 02/08/2018 1603) LTG: Pt will perform car transfers with assist:: -- (d/c 9/5 cw) Note:  Discontinued due to pt progress and d/c to SNF   Problem: RH Furniture  Transfers Goal: LTG Patient will perform furniture transfers w/assist (OT/PT) Description LTG: Patient will perform furniture transfers  with assistance (OT/PT). Outcome: Not Applicable Flowsheets (Taken 02/08/2018 1603) LTG: Pt will perform furniture transfers with assist:: -- (d/c 9/5 cw) Note:  Discontinued due to pt progress   Problem: RH Wheelchair Mobility Goal: LTG Patient will propel w/c in controlled environment (PT) Description LTG: Patient will propel wheelchair in controlled environment, # of feet with assist (PT) Outcome: Not Applicable Flowsheets (Taken 02/08/2018 1603) LTG: Pt will propel w/c in controlled environ  assist needed:: -- (d/c 9/5 cw) Note:  Discontinued due to pt progress Goal: LTG Patient will propel w/c in home environment (PT) Description LTG: Patient will propel wheelchair in home environment, # of feet with assistance (PT). Outcome: Not Applicable Flowsheets (Taken 02/08/2018 1603) LTG: Pt will propel w/c in home environ  assist needed:: -- (d/c 9/5 cw) Note:  Discontinued due to pt progress   Problem: RH Memory Goal: LTG Patient will demonstrate ability for day to day recall/carry over during activities of daily living with assistance level (PT) Description LTG:  Patient will demonstrate ability for day to day recall/carry over during activities of daily living with assistance level (PT). Outcome: Not Applicable Flowsheets (Taken 02/08/2018 1603) LTG:  Patient will demonstrate ability for day to day recall/carry over during activities of daily living with assistance level (PT): -- (d/c 9/5 cw) Note:  Discontinued due to pt progress

## 2018-02-08 NOTE — Progress Notes (Signed)
Physical Therapy Note  Patient Details  Name: Brandon [name] MRN: [id number] Date of Birth: Feb 07, 1941 Today's Date: 02/08/2018  Documentation for BP monitoring throughout PT session on 9/5.   BP: 98/57 with HOB at 30 degrees, TEDs applied 104/74 with HOB at 30 degrees, TEDs/aces/abdominal binder applied 73/54 sitting upright in w/c 116/83 reclined fully in w/c 81/64 w/c tilted to 20 degrees 89/64 w/c tilted to 20 degrees at 5 minutes   Stephania Fragmin 02/08/2018, 1:27 PM

## 2018-02-08 NOTE — Progress Notes (Signed)
Occupational Therapy Session Note  Patient Details  Name: Brandon [name] MRN: [id number] Date of Birth: April 29, 1941  Today's Date: 02/08/2018 OT Individual Time: 9924-2683 OT Individual Time Calculation (min): 38 min  and Today's Date: 02/08/2018 OT Missed Time: 20 Minutes Missed Time Reason: Patient fatigue   Short Term Goals: Week 3:  OT Short Term Goal 1 (Week 3): STGS=LTGs secondary to upcoming discharge  Skilled Therapeutic Interventions/Progress Updates:    Upon entering the room, pt in wheelchair with RN present. Pt sleeping soundly but does not appear in any distress. OT making multiple attempts to arouse pt for participation but pt continues to have eyes closed and incoherent mumbling. Maxi move utilized for pt and therapist safety to return to pt bed with +2 assistance. Pt remained asleep throughout transfer. BP taken with pt in supine and results of 123/84. OT placed B UE resting hand splint on pt as well as B LE ankle contracture boots. Bed alarm activated and soft call bell placed within reach.   Therapy Documentation Precautions:  Precautions Precautions: Fall, Cervical Required Braces or Orthoses: Cervical Brace Cervical Brace: Hard collar, Other (comment)(Don/doff in sitting. Ok to doff in bed, when ambulating to bathroom, and when showering per MD order) Restrictions Weight Bearing Restrictions: No General: General OT Amount of Missed Time: 20 Minutes Vital Signs:  Pain:   ADL: ADL ADL Comments: Please see functional navigator for ADL status  See Function Navigator for Current Functional Status.   Therapy/Group: Individual Therapy  Alen Bleacher 02/08/2018, 4:18 PM

## 2018-02-08 NOTE — Progress Notes (Signed)
Overnight pulse oximetry report placed in pt's file.

## 2018-02-08 NOTE — Progress Notes (Signed)
Speech Language Pathology Weekly Progress and Session Note  Patient Details  Name: Brandon [name] MRN: [id number] Date of Birth: November 30, 1940  Beginning of progress report period: February 02, 2018 End of progress report period: February 08, 2018  Today's Date: 02/08/2018 SLP Individual Time: 0626-9485 SLP Individual Time Calculation (min): 30 min  Short Term Goals: Week 3: SLP Short Term Goal 1 (Week 3): Pt will sustain attention to basic familiar tasks for ~ 10 minutes with Mod A cues.  SLP Short Term Goal 1 - Progress (Week 3): Not met SLP Short Term Goal 2 (Week 3): Pt will utilize speech intelligibility strategies to improve speech intelligibility to ~90% at the sentence level given Mod A cues. SLP Short Term Goal 2 - Progress (Week 3): Not met SLP Short Term Goal 3 (Week 3): Given 2 choices related to orientation information, pt will select correct choice in 8 of 10 opportunities with Mod A cues.  SLP Short Term Goal 3 - Progress (Week 3): Not met    New Short Term Goals: Week 4: SLP Short Term Goal 1 (Week 4): Pt will sustain attention to basic familiar tasks for ~ 5 minutes with Max A cues.  SLP Short Term Goal 2 (Week 4): Pt will communicate intelligible basic wants and needs with Mod A.  SLP Short Term Goal 3 (Week 4): Given 2 choices related to orientation information, pt will select correct choice in 8 of 10 opportunities with Max A cues.   Weekly Progress Updates: Pt has made no progress towards goals during this reporting period d/t continued confusion, visual hallucinations and inability to maintain alertness. Currently pt requires Total A for brief sustained/focused attention which impacts his ability to perform tasks.      Intensity: Minumum of 1-2 x/day, 30 to 90 minutes Frequency: 3 to 5 out of 7 days Duration/Length of Stay: TBD determined Treatment/Interventions: Cognitive remediation/compensation;Cueing hierarchy;Internal/external aids;Patient/family  education;Functional tasks   Daily Session  Skilled Therapeutic Interventions: Skilled treatment session focused on cognition goals. SLP received pt upright in wheelchair asleep at the nursing station. SLP provided Total A for brief focused attention < 1 minute. SLP attempted functional task of consuming lunch but pt with visual hallucinations and was not able to be directed to task. Pt with no consumption of PO intake as a result. Despite Total A, pt was not able to focus attention. Nursing made aware of continued decreased intake d/t confusion. Pt was left upright in wheelchair at nurses station.      Function:     Cognition Comprehension Comprehension assist level: Understands basic less than 25% of the time/ requires cueing >75% of the time  Expression   Expression assist level: Expresses basis less than 25% of the time/requires cueing >75% of the time.  Social Interaction Social Interaction assist level: Interacts appropriately less than 25% of the time. May be withdrawn or combative.  Problem Solving Problem solving assist level: Solves basic less than 25% of the time - needs direction nearly all the time or does not effectively solve problems and may need a restraint for safety  Memory Memory assist level: Recognizes or recalls less than 25% of the time/requires cueing greater than 75% of the time   General    Pain Pain Assessment Pain Scale: Faces Faces Pain Scale: Hurts a little bit Pain Location: Bladder Pain Descriptors / Indicators: Burning Pain Intervention(s): (pt was cathed and voided )  Therapy/Group: Individual Therapy  Katlen Seyer 02/08/2018, 12:36 PM

## 2018-02-08 NOTE — Progress Notes (Signed)
ANTICOAGULATION CONSULT NOTE - Follow Up Consult  Pharmacy Consult for warfarin Indication: atrial fibrillation  Allergies  Allergen Reactions  . Flecainide Palpitations and Other (See Comments)    TACHYCARDIA SYNCOPE > LOC  . Sulfamethoxazole Other (See Comments)    Internal bleeding    Patient Measurements: Height: 6\' 1"  (185.4 cm) Weight: 186 lb 1.1 oz (84.4 kg) IBW/kg (Calculated) : 79.9  Vital Signs: Temp: 98.9 F (37.2 C) (09/05 0509) Temp Source: Oral (09/05 0509) BP: 124/94 (09/05 0908) Pulse Rate: 78 (09/05 0908)  Labs: Recent Labs    02/06/18 0606 02/07/18 0547 02/07/18 0955 02/08/18 0549  HGB 13.2 13.5  --  13.4  HCT 39.5 39.2  --  39.4  PLT 347 331  --  309  LABPROT 28.0* 28.9*  --  24.9*  INR 2.64 2.75  --  2.28  CREATININE  --   --  0.77  --     Estimated Creatinine Clearance: 90.2 mL/min (by C-G formula based on SCr of 0.77 mg/dL).   Medical History: Past Medical History:  Diagnosis Date  . Atrial fibrillation (HCC)     Medications:  Medications Prior to Admission  Medication Sig Dispense Refill Last Dose  . escitalopram (LEXAPRO) 10 MG tablet Take 10 mg by mouth daily.  0 01/15/2018 at Unknown time  . lisinopril (PRINIVIL,ZESTRIL) 20 MG tablet Take 20 mg by mouth daily.  2 01/15/2018 at Unknown time  . metoprolol tartrate (LOPRESSOR) 50 MG tablet Take 75 mg by mouth daily.   0 01/15/2018 at Unknown time  . tamsulosin (FLOMAX) 0.4 MG CAPS capsule Take 0.4 mg by mouth daily with supper.  2 01/15/2018 at Unknown time  . warfarin (COUMADIN) 5 MG tablet Take 2.5-5 mg by mouth See admin instructions. Take 5 mg on Tues, Thurs. Saturday Take 2.5 mg on Mon. Wed. Fri. And Sunday Take in the Morning  0 01/15/2018 at Unknown time    Assessment: 77 y/o male on Coumadin 2.5mg  daily except for 5mg  on TTSat PTA for Afib. INR was 2.66 on admit. Kcentra given due to epidural hematoma. Restarting Coumadin on 8/19 with no bridge.   INR is therapeutic today at  2.28 and looks to be stabilizing.   PO intake low but has slightly increased today.   No s/s of bleeding reported by nurse.   Goal of Therapy:  INR 2-3 Monitor platelets by anticoagulation protocol: Yes   Plan:  Warfarin 5mg  x1 dose tonight  Will continue to monitor daily INR, CBC, s/s of bleeding, and PO intake  Ulyses Southward, PharmD, [place], AAHIVP, CPP Infectious Disease Pharmacist Pager: [phone] 02/08/2018 9:20 AM

## 2018-02-08 NOTE — Plan of Care (Signed)
  Problem: Consults Goal: Skin Care Protocol Initiated - if Braden Score 18 or less Description If consults are not indicated, leave blank or document N/A Outcome: Progressing   Problem: SCI BOWEL ELIMINATION Goal: RH STG MANAGE BOWEL WITH ASSISTANCE Description STG Manage Bowel with mod Assistance.  Outcome: Progressing Goal: RH STG SCI MANAGE BOWEL PROGRAM W/ASSIST OR AS APPROPRIATE Description STG SCI Manage bowel program w/mod assist or as appropriate.  Outcome: Progressing   Problem: SCI BLADDER ELIMINATION Goal: RH STG MANAGE BLADDER WITH ASSISTANCE Description STG Manage Bladder With mod Assistance  Outcome: Progressing Goal: RH STG MANAGE BLADDER WITH EQUIPMENT WITH ASSISTANCE Description STG Manage Bladder With Equipment With mod Assistance  Outcome: Progressing Goal: RH STG SCI MANAGE BLADDER PROGRAM W/ASSISTANCE Description Mod assist  Outcome: Progressing   Problem: RH SKIN INTEGRITY Goal: RH STG SKIN FREE OF INFECTION/BREAKDOWN Description Patients skin will remain free from further infection or breakdown with mod assist.  Outcome: Progressing Goal: RH STG MAINTAIN SKIN INTEGRITY WITH ASSISTANCE Description STG Maintain Skin Integrity With mod Assistance.  Outcome: Progressing Goal: RH STG ABLE TO PERFORM INCISION/WOUND CARE W/ASSISTANCE Description STG Able To Perform Incision/Wound Care With total Assistance from caregiver .  Outcome: Progressing   Problem: RH SAFETY Goal: RH STG ADHERE TO SAFETY PRECAUTIONS W/ASSISTANCE/DEVICE Description STG Adhere to Safety Precautions With mod Assistance/Device.  Outcome: Progressing   Problem: RH PAIN MANAGEMENT Goal: RH STG PAIN MANAGED AT OR BELOW PT'S PAIN GOAL Description < 4  Outcome: Progressing

## 2018-02-08 NOTE — Progress Notes (Signed)
Spoke with Dorathy Daft RN re IV consult.  States pt is in chair and will reorder consult once back in bed.
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
## 2018-02-09 ENCOUNTER — Inpatient Hospital Stay (HOSPITAL_COMMUNITY): Payer: Medicare Other | Admitting: Speech Pathology

## 2018-02-09 ENCOUNTER — Inpatient Hospital Stay (HOSPITAL_COMMUNITY): Payer: Medicare Other | Admitting: Physical Therapy

## 2018-02-09 ENCOUNTER — Inpatient Hospital Stay (HOSPITAL_COMMUNITY): Payer: Medicare Other | Admitting: Occupational Therapy

## 2018-02-09 ENCOUNTER — Encounter (HOSPITAL_COMMUNITY): Payer: Medicare Other | Admitting: Speech Pathology

## 2018-02-09 LAB — CBC
HEMATOCRIT: 37.9 % — AB (ref 39.0–52.0)
Hemoglobin: 13 g/dL (ref 13.0–17.0)
MCH: 32.5 pg (ref 26.0–34.0)
MCHC: 34.3 g/dL (ref 30.0–36.0)
MCV: 94.8 fL (ref 78.0–100.0)
Platelets: 258 10*3/uL (ref 150–400)
RBC: 4 MIL/uL — ABNORMAL LOW (ref 4.22–5.81)
RDW: 12.4 % (ref 11.5–15.5)
WBC: 5.5 10*3/uL (ref 4.0–10.5)

## 2018-02-09 LAB — BASIC METABOLIC PANEL
Anion gap: 7 (ref 5–15)
BUN: 12 mg/dL (ref 8–23)
CHLORIDE: 108 mmol/L (ref 98–111)
CO2: 21 mmol/L — AB (ref 22–32)
CREATININE: 0.68 mg/dL (ref 0.61–1.24)
Calcium: 8.4 mg/dL — ABNORMAL LOW (ref 8.9–10.3)
GFR calc Af Amer: >60 mL/min (ref >60)
GFR calc non Af Amer: >60 mL/min (ref >60)
GLUCOSE: 92 mg/dL (ref 70–99)
Potassium: 4 mmol/L (ref 3.5–5.1)
Sodium: 136 mmol/L (ref 135–145)

## 2018-02-09 LAB — TSH: TSH: 4.392 u[IU]/mL (ref 0.350–4.500)

## 2018-02-09 LAB — PROTIME-INR
INR: 3.32
Prothrombin Time: 33.4 seconds — ABNORMAL HIGH (ref 11.4–15.2)

## 2018-02-09 LAB — CORTISOL-AM, BLOOD: Cortisol - AM: 7.4 ug/dL (ref 6.7–22.6)

## 2018-02-09 LAB — FOLATE: FOLATE: 21.6 ng/mL (ref >5.9)

## 2018-02-09 LAB — VITAMIN B12: VITAMIN B 12: 885 pg/mL (ref 180–914)

## 2018-02-09 MED ORDER — SODIUM CHLORIDE 0.9 % IV SOLN
1.0000 g | INTRAVENOUS | Status: DC
  Administered 2018-02-09 – 2018-02-12 (×5): 1 g via INTRAVENOUS
  Filled 2018-02-09 (×4): qty 10

## 2018-02-09 MED ORDER — QUETIAPINE FUMARATE 25 MG PO TABS
25.0000 mg | ORAL_TABLET | Freq: Every day | ORAL | Status: DC
  Administered 2018-02-09 – 2018-02-26 (×18): 25 mg via ORAL
  Filled 2018-02-09 (×19): qty 1

## 2018-02-09 MED ORDER — ENSURE ENLIVE PO LIQD
237.0000 mL | Freq: Two times a day (BID) | ORAL | Status: DC
  Administered 2018-02-09 – 2018-02-27 (×30): 237 mL via ORAL

## 2018-02-09 NOTE — Progress Notes (Signed)
Speech Language Pathology Daily Session Note  Patient Details  Name: Brandon [name] MRN: [id number] Date of Birth: [birth year]  Today's Date: 02/09/2018 SLP Individual Time:  -     Short Term Goals: Week 4: SLP Short Term Goal 1 (Week 4): Pt will sustain attention to basic familiar tasks for ~ 5 minutes with Max A cues.  SLP Short Term Goal 2 (Week 4): Pt will communicate intelligible basic wants and needs with Mod A.  SLP Short Term Goal 3 (Week 4): Given 2 choices related to orientation information, pt will select correct choice in 8 of 10 opportunities with Max A cues.   Skilled Therapeutic Interventions: Skilled treatment provided with PT to maximize cognitive function within physical movement. SLP facilitated session by providing Max A cues for orientation, to sustained attention and to initiate movements within PT objective driven tasks. Pt was left upright in bed with PT.      Function:    Cognition Comprehension Comprehension assist level: Understands basic less than 25% of the time/ requires cueing >75% of the time  Expression   Expression assist level: Expresses basis less than 25% of the time/requires cueing >75% of the time.  Social Interaction Social Interaction assist level: Interacts appropriately less than 25% of the time. May be withdrawn or combative.  Problem Solving Problem solving assist level: Solves basic less than 25% of the time - needs direction nearly all the time or does not effectively solve problems and may need a restraint for safety  Memory Memory assist level: Recognizes or recalls less than 25% of the time/requires cueing greater than 75% of the time    Pain    Therapy/Group: Individual Therapy  [name] 02/09/2018, 2:59 PM
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
## 2018-02-09 NOTE — Progress Notes (Signed)
Social Work Patient ID: Brandon [name], male   DOB: [birth year], 77 y.o.   MRN: [id number]    Patient/Family Conference  Patient/family in attendance: Daughter, son-in-law and pt's fiance, Personnel officer in attendance: Dr. Riley Kill, Carmie End, RN; Amada Jupiter, LCSW;  Reuel Derby, SLP;  Roney Mans, OT;  Estill Dooms, PT;  Renda Rolls, OT  Main focus:  To review pt's injuries, current medical and therapy status and goal expectations.  Synopsis of information shared:  All team members shared their information of current functional status and goals.  MD and RN address medical issues and prognosis for longer term recovery.  Made family and fiance aware that goals are set for an overall level of +1 assistance, wheelchair level for pt and hope for cognitive improvements that may allow pt to direct some of his care in the SNF.  Barriers/concerns expressed by patient and family:  No concerns or barriers noted by family, however, they did ask clarifying questions.  Patient/family response:  Family very appreciative of team information shared and note they feel that their questions had been answered fully.  They are aware that we will continue to update information in weekly team conferences and prn.  Follow-up/action plans:  Plan for f/u after weekly conferences.  Plan for d/c is for pt to transition to SNF from CIR.  Hansel Devan, LCSW

## 2018-02-09 NOTE — Progress Notes (Signed)
Call returned to [facility] instructed to notify other family members for updates

## 2018-02-09 NOTE — Progress Notes (Signed)
Occupational Therapy Session Note  Patient Details  Name: Brandon [name] MRN: [id number] Date of Birth: 12/19/1940  Today's Date: 02/09/2018 OT Individual Time: 7169-6789 OT Individual Time Calculation (min): 34 min    Skilled Therapeutic Interventions/Progress Updates: Upon approach for therapy, patient was talking with his eyes closed.   THis clinician attempted to inform patient of place and situation, but he continued to speak of non oriented situations.    THis clinician was unable to engage patient in meaningful conversation at all.   He stated he wanted to 'go to the bathroom,' but RN and this clinician felt transferring to [facility] at that moment was not safe.   As well, nurse reminded this clinician that patient was incontinent.   But by the time this clinician went to patient to offer a bedpan, he had fallen back to sleep.     Patient was wearing a brief that appeared wet; so, RN and this clinician attempted to change and clean his brief.     He rearoused for a bout 5 minutes long enough to attempt to assist with the lateral rolls and then again fell back to sleep.  Patient was left lying in bed sleeping with call bell in place.     Therapy Documentation Precautions:  Precautions Precautions: Fall, Cervical Required Braces or Orthoses: Cervical Brace Cervical Brace: Hard collar, Other (comment)(Don/doff in sitting. Ok to doff in bed, when ambulating to bathroom, and when showering per MD order) Restrictions Weight Bearing Restrictions: No General: General OT Amount of Missed Time: 11 Minutes(11)  Pain:not sure - patient did not answer the question    Therapy/Group: Individual Therapy  Bud Face [facility] 02/09/2018, 8:36 PM

## 2018-02-09 NOTE — Progress Notes (Signed)
Physical Therapy Session Note  Patient Details  Name: Brandon [name] MRN: [id number] Date of Birth: [birth year]  Today's Date: 02/09/2018 PT Individual Time:1400-1410 and 1445-1500 PT Individual Time Calculation (min): 10 + 15 min = 25 min   and  Today's Date: 02/09/2018 PT Co-Treatment Time: 1425-1445 PT Co-Treatment Time Calculation (min): 20 min  Short Term Goals: Week 3:  PT Short Term Goal 1 (Week 3): Patient to be able to maintain sitting balance with ModAx1  PT Short Term Goal 2 (Week 3): Patient to be able to perform sliding board transfer with assist of +1 consistently  PT Short Term Goal 3 (Week 3): Patient to initiate standing with MaxA and LRAD  PT Short Term Goal 4 (Week 3): Patient to be able to maintain consistent conversation and focus on simple tasks with therapist for at least 30 minute period without becoming fatigued or distracted   Skilled Therapeutic Interventions/Progress Updates:    no indication of pain based on faces scale.  Part of session skilled cotreat with SLP.  Session focus on upright tolerance in bed, cognitive remediation, following 1-step commands, and LE stretching.    Pt intermittently alert throughout session but requires max cues to maintain, frequently falling sleep but able to maintain arousal up to 2 minutes this session.  PT/SLP applied teds, aces, abdominal binder total assist.  BP 118/80 in supine.  HOB elevated to 60 degrees and BP drops to 99/78 but remains at this level for 25 minutes.  Pt positioned in supported circle sitting, with PT providing hand over hand assist to facilitate LE stretch in this position.  Attempted following 1-step commands for reaching for colored bean bag but pt requiring hand over hand assist and unable to attend to task.  PT and SLP providing max cues for re-orientation and attention to task throughout entire session.  At end of session, pt becoming anxious/tearful stating his bladder is very painful and he needs to use the  bathroom.  RN alerted for cath and states she will be in shortly.  Soft call bell in reach and needs met.   Therapy Documentation Precautions:  Precautions Precautions: Fall, Cervical Required Braces or Orthoses: Cervical Brace Cervical Brace: Hard collar, Other (comment)(Don/doff in sitting. Ok to doff in bed, when ambulating to bathroom, and when showering per MD order) Restrictions Weight Bearing Restrictions: No   See Function Navigator for Current Functional Status.   Therapy/Group: Individual Therapy  [name] 02/09/2018, 4:40 PM
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
## 2018-02-09 NOTE — Progress Notes (Signed)
Occupational Therapy Session Note  Patient Details  Name: Brandon [name] MRN: [id number] Date of Birth: 1940/10/26  Today's Date: 02/09/2018 OT Individual Time: 1100-1110 OT Individual Time Calculation (min): 10 min  50 minutes missed.   Short Term Goals: Week 3:  OT Short Term Goal 1 (Week 3): STGS=LTGs secondary to upcoming discharge  Skilled Therapeutic Interventions/Progress Updates:    Pt greeted supine in bed via OT handoff. Sleeping with mouth open. Unable to arouse with auditory stimulation, sternal rub and cold wash cloth to face. Pt muttering nonsensical speech with eyes closed, appeared to grimace however unable to state where/if he felt pain. RN made aware. BP 84/57. OT applied bilateral hands splints and elevated arms on pillows. 50 minutes missed due to lethargy.   Therapy Documentation Precautions:  Precautions Precautions: Fall, Cervical Required Braces or Orthoses: Cervical Brace Cervical Brace: Hard collar, Other (comment)(Don/doff in sitting. Ok to doff in bed, when ambulating to bathroom, and when showering per MD order) Restrictions Weight Bearing Restrictions: No Pain: Facial grimacing. RN made aware.    ADL: ADL ADL Comments: Please see functional navigator for ADL status     See Function Navigator for Current Functional Status.   Therapy/Group: Individual Therapy  Davidson Palmieri A Willadeen Colantuono 02/09/2018, 11:35 AM

## 2018-02-09 NOTE — Progress Notes (Signed)
ANTICOAGULATION CONSULT NOTE - Follow Up Consult  Pharmacy Consult for warfarin Indication: atrial fibrillation  Allergies  Allergen Reactions  . Flecainide Palpitations and Other (See Comments)    TACHYCARDIA SYNCOPE > LOC  . Sulfamethoxazole Other (See Comments)    Internal bleeding    Patient Measurements: Height: 6\' 1"  (185.4 cm) Weight: 186 lb 1.1 oz (84.4 kg) IBW/kg (Calculated) : 79.9  Vital Signs: Temp: 97.7 F (36.5 C) (09/06 0507) Temp Source: Oral (09/06 0507) BP: 143/87 (09/06 0507) Pulse Rate: 76 (09/06 0507)  Labs: Recent Labs    02/07/18 0547 02/07/18 0955 02/08/18 0549 02/09/18 0731  HGB 13.5  --  13.4 13.0  HCT 39.2  --  39.4 37.9*  PLT 331  --  309 258  LABPROT 28.9*  --  24.9* 33.4*  INR 2.75  --  2.28 3.32  CREATININE  --  0.77  --  0.68    Estimated Creatinine Clearance: 90.2 mL/min (by C-G formula based on SCr of 0.68 mg/dL).   Medical History: Past Medical History:  Diagnosis Date  . Atrial fibrillation (HCC)     Medications:  Medications Prior to Admission  Medication Sig Dispense Refill Last Dose  . escitalopram (LEXAPRO) 10 MG tablet Take 10 mg by mouth daily.  0 01/15/2018 at Unknown time  . lisinopril (PRINIVIL,ZESTRIL) 20 MG tablet Take 20 mg by mouth daily.  2 01/15/2018 at Unknown time  . metoprolol tartrate (LOPRESSOR) 50 MG tablet Take 75 mg by mouth daily.   0 01/15/2018 at Unknown time  . tamsulosin (FLOMAX) 0.4 MG CAPS capsule Take 0.4 mg by mouth daily with supper.  2 01/15/2018 at Unknown time  . warfarin (COUMADIN) 5 MG tablet Take 2.5-5 mg by mouth See admin instructions. Take 5 mg on Tues, Thurs. Saturday Take 2.5 mg on Mon. Wed. Fri. And Sunday Take in the Morning  0 01/15/2018 at Unknown time    Assessment: 77 y/o male on Coumadin 2.5mg  daily except for 5mg  on TTSat PTA for Afib. INR was 2.66 on admit. Kcentra given due to epidural hematoma. Restarting Coumadin on 8/19 with no bridge.   INR back up to above 3  again. Pt intake is still low. This may have made the INR more sensitive. We will hold coumadin today.   No s/s of bleeding reported by nurse.   Goal of Therapy:  INR 2-3 Monitor platelets by anticoagulation protocol: Yes   Plan:  No coumadin today Will continue to monitor daily INR, CBC, s/s of bleeding, and PO intake  Ulyses Southward, PharmD, [place], AAHIVP, CPP Infectious Disease Pharmacist Pager: [phone] 02/09/2018 9:03 AM

## 2018-02-10 ENCOUNTER — Inpatient Hospital Stay (HOSPITAL_COMMUNITY): Payer: Medicare Other | Admitting: Physical Therapy

## 2018-02-10 LAB — PROTIME-INR
INR: 3.99
PROTHROMBIN TIME: 38.7 s — AB (ref 11.4–15.2)

## 2018-02-10 NOTE — Progress Notes (Signed)
ANTICOAGULATION CONSULT NOTE - Follow Up Consult  Pharmacy Consult for warfarin Indication: atrial fibrillation  Allergies  Allergen Reactions  . Flecainide Palpitations and Other (See Comments)    TACHYCARDIA SYNCOPE > LOC  . Sulfamethoxazole Other (See Comments)    Internal bleeding    Patient Measurements: Height: 6\' 1"  (185.4 cm) Weight: 186 lb 1.1 oz (84.4 kg) IBW/kg (Calculated) : 79.9  Vital Signs: Temp: 97.7 F (36.5 C) (09/07 0700) Temp Source: Oral (09/07 0700) BP: 102/76 (09/07 0948) Pulse Rate: 89 (09/07 0700)  Labs: Recent Labs    02/08/18 0549 02/09/18 0731 02/10/18 0546  HGB 13.4 13.0  --   HCT 39.4 37.9*  --   PLT 309 258  --   LABPROT 24.9* 33.4* 38.7*  INR 2.28 3.32 3.99  CREATININE  --  0.68  --     Estimated Creatinine Clearance: 90.2 mL/min (by C-G formula based on SCr of 0.68 mg/dL).   Medical History: Past Medical History:  Diagnosis Date  . Atrial fibrillation (HCC)     Medications:  Medications Prior to Admission  Medication Sig Dispense Refill Last Dose  . escitalopram (LEXAPRO) 10 MG tablet Take 10 mg by mouth daily.  0 01/15/2018 at Unknown time  . lisinopril (PRINIVIL,ZESTRIL) 20 MG tablet Take 20 mg by mouth daily.  2 01/15/2018 at Unknown time  . metoprolol tartrate (LOPRESSOR) 50 MG tablet Take 75 mg by mouth daily.   0 01/15/2018 at Unknown time  . tamsulosin (FLOMAX) 0.4 MG CAPS capsule Take 0.4 mg by mouth daily with supper.  2 01/15/2018 at Unknown time  . warfarin (COUMADIN) 5 MG tablet Take 2.5-5 mg by mouth See admin instructions. Take 5 mg on Tues, Thurs. Saturday Take 2.5 mg on Mon. Wed. Fri. And Sunday Take in the Morning  0 01/15/2018 at Unknown time    Assessment: 77 y/o male on Coumadin 2.5mg  daily except for 5mg  on TTSat PTA for Afib. INR was 2.66 on admit. Kcentra given due to epidural hematoma. Restarting Coumadin on 8/19 with no bridge. 9/6 INR jumped 2.28>3.32 and dose was held.  9/7 Today INR continues to  rise despite dose being held last night. INR 3.99. Patient's intake is still poor, which will effect coumadin dosing. Per RN no s/s of bleeding reported by nurse.  Goal of Therapy:  INR 2-3 Monitor platelets by anticoagulation protocol: Yes   Plan:  No coumadin again today Will continue to monitor daily INR, CBC, s/sx of bleeding, and PO intake  Thank you for involving pharmacy in this patient's care.  Wendelyn Breslow, PharmD PGY1 Pharmacy Resident Phone: [phone] 02/10/2018 10:07 AM

## 2018-02-10 NOTE — Progress Notes (Addendum)
Brandon [name] is a 77 y.o. male 12/07/1940 [id number]  Subjective: No new complaints. No new problems.  Objective: Vital signs in last 24 hours: Temp:  [97.7 F (36.5 C)-97.9 F (36.6 C)] 97.9 F (36.6 C) (09/07 1445) Pulse Rate:  [68-89] 68 (09/07 1445) Resp:  [18] 18 (09/07 1445) BP: (90-158)/(61-96) 90/61 (09/07 1445) SpO2:  [93 %-98 %] 97 % (09/07 1445) Weight change:  Last BM Date: 02/08/18  Intake/Output from previous day: 09/06 0701 - 09/07 0700 In: 1150 [P.O.:250; I.V.:900] Out: 208 [Urine:208] Last cbgs: CBG (last 3)  No results for input(s): GLUCAP in the last 72 hours.   Physical Exam General: No apparent distress   HEENT: not dry Lungs: Normal effort. Lungs clear to auscultation, no crackles or wheezes. Cardiovascular: Regular rate and rhythm, no edema Abdomen: S/NT/ND; BS(+) Musculoskeletal:  unchanged Neurological: No new neurological deficits.  Confused.  Mumbling incoherently at times. Wounds: Neck collar Skin: clear  Aging changes Mental state: Alert, disoriented    Lab Results: BMET    Component Value Date/Time   NA 136 02/09/2018 0731   K 4.0 02/09/2018 0731   CL 108 02/09/2018 0731   CO2 21 (L) 02/09/2018 0731   GLUCOSE 92 02/09/2018 0731   BUN 12 02/09/2018 0731   CREATININE 0.68 02/09/2018 0731   CALCIUM 8.4 (L) 02/09/2018 0731   GFRNONAA >60 02/09/2018 0731   GFRAA >60 02/09/2018 0731   CBC    Component Value Date/Time   WBC 5.5 02/09/2018 0731   RBC 4.00 (L) 02/09/2018 0731   HGB 13.0 02/09/2018 0731   HCT 37.9 (L) 02/09/2018 0731   PLT 258 02/09/2018 0731   MCV 94.8 02/09/2018 0731   MCH 32.5 02/09/2018 0731   MCHC 34.3 02/09/2018 0731   RDW 12.4 02/09/2018 0731   LYMPHSABS 0.6 (L) 01/22/2018 0659   MONOABS 0.9 01/22/2018 0659   EOSABS 0.5 01/22/2018 0659   BASOSABS 0.0 01/22/2018 0659    Studies/Results: No results found.  Medications: I have reviewed the patient's current medications.  Assessment/Plan:        1.  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  Status post C3-4, 4-5 and 5-6 with C6-7 laminectomy posterior lateral arthrodesis.  Cervical collar.  The patient is Quadri paretic.  CIR. 2.  DVT prophylaxis with Coumadin 3.  Pain management with Tylenol PRN 4.  TBI.  CIR.  On Lexapro and Seroquel 5.  Dysphagia type II.  Advance diet as tolerated from thins.  Megace for poor intake low volume IV fluids 6.  Atrial fibrillation continue with Coumadin 7.  Orthostatic hypotension.  Continue with Florinef and TED stockings.  Abdominal binder.  Blood pressure meds were reduced 8.  Hypertension.  Lopressor and lisinopril were reduced due to orthostasis 9.  Neurogenic bowel and neurogenic bladder.  In and out catheterization.  Bowel program. 10.  History of obstructive sleep apnea.  Monitoring at night.    Length of stay, days: 22  Sonda Primes , MD 02/10/2018, 7:00 PM

## 2018-02-10 NOTE — Progress Notes (Signed)
Physical Therapy Session Note  Patient Details  Name: Brandon [name] MRN: [id number] Date of Birth: 1940/07/30  Today's Date: 02/10/2018 PT Individual Time: 8466-5993 PT Individual Time Calculation (min): 56 min    Skilled Therapeutic Interventions/Progress Updates:    Pt supine in bed when therapist enters room. Pt reports pain at left side of head but unable to rate. Bed at approx 25 degrees elevation and BP:  135/86.  Pt confabulates when answering questions and reports being fatigued.  Session initiated with PT donning pt TED hose and wrapping legs.  Pt noted to be straining to urinate and reporting severe pain.  Nursing notified and came to scan bladder, however, pt did not need cath.  Pt had soiled depends so nursing and PT rolled pt with +2 and dependently changed depends and donned abdominal binder.  Pt then returned to supine and raised HOB to 45 degrees.  BP decreased to 116/80, however, pt asymmptomatic and then raised to 60 degrees.  BP decreased 106/89.  The decreased to 45 degrees and BP remained at 106/89.  Gentle AAROM into b/l ankle DF, hip abduction, and knee/hip flexion was performed.  Nursing notified of pt response to treatment.  Due to pt intermittently falling asleep for long period of times and pt needing to eat, session terminated early.  Following session, pt left in care of fiance and nursing to return to room for feeding.  Brace was donned t/o session.  Therapy Documentation Precautions:  Precautions Precautions: Fall, Cervical Required Braces or Orthoses: Cervical Brace Cervical Brace: Hard collar, Other (comment)(Don/doff in sitting. Ok to doff in bed, when ambulating to bathroom, and when showering per MD order) Restrictions Weight Bearing Restrictions: No General: PT Amount of Missed Time (min): 19 Minutes PT Missed Treatment Reason: Other (Comment)   See Function Navigator for Current Functional Status.   Therapy/Group: Individual Therapy  Brandon [name] 02/10/2018, 12:23 PM

## 2018-02-10 NOTE — Progress Notes (Signed)
Patient was calling into the hall way earlier. Patient stated that he needed to get up & wake up his daughter. He was asked how old his daughter was to check his orientation & he stated 89. He was reoriented to place & he state " Oh yeah, now I remember", but then wanted to walk to the bathroom. He was reoriented to situation. He tolerated his medications well. At approximately 2200, patient was again calking into the hallway. Nurse tech went into the room & stated that the patient stated that there was a policeman in his room with a gun. At this time he is sleeping. Will continue to monitor

## 2018-02-11 LAB — CBC
HCT: 37.9 % — ABNORMAL LOW (ref 39.0–52.0)
Hemoglobin: 12.7 g/dL — ABNORMAL LOW (ref 13.0–17.0)
MCH: 32.2 pg (ref 26.0–34.0)
MCHC: 33.5 g/dL (ref 30.0–36.0)
MCV: 96.2 fL (ref 78.0–100.0)
PLATELETS: 231 10*3/uL (ref 150–400)
RBC: 3.94 MIL/uL — ABNORMAL LOW (ref 4.22–5.81)
RDW: 12.6 % (ref 11.5–15.5)
WBC: 6 10*3/uL (ref 4.0–10.5)

## 2018-02-11 LAB — URINE CULTURE

## 2018-02-11 LAB — PROTIME-INR
INR: 3.44
PROTHROMBIN TIME: 34.4 s — AB (ref 11.4–15.2)

## 2018-02-11 NOTE — Progress Notes (Signed)
Brandon [name] is a 77 y.o. male 1941-02-22 [id number]  Subjective: No new problems.   Objective: Vital signs in last 24 hours: Temp:  [97.7 F (36.5 C)-98.9 F (37.2 C)] 98.9 F (37.2 C) (09/08 0500) Pulse Rate:  [61-77] 61 (09/08 0616) Resp:  [15-18] 15 (09/08 0500) BP: (90-167)/(61-123) 148/94 (09/08 0616) SpO2:  [96 %-100 %] 100 % (09/08 0616) Weight change:  Last BM Date: 02/10/18  Intake/Output from previous day: 09/07 0701 - 09/08 0700 In: 180 [P.O.:180] Out: -  Last cbgs: CBG (last 3)  No results for input(s): GLUCAP in the last 72 hours.   Physical Exam General: No apparent distress   HEENT: not dry Lungs: Normal effort. Lungs clear to auscultation, no crackles or wheezes. Cardiovascular: Regular rate and rhythm, no edema Abdomen: S/NT/ND; BS(+) Musculoskeletal:  unchanged Neurological: No new neurological deficits Wounds: Neck collar Skin: clear  Aging changes Mental state: Somnolent, confused    Lab Results: BMET    Component Value Date/Time   NA 136 02/09/2018 0731   K 4.0 02/09/2018 0731   CL 108 02/09/2018 0731   CO2 21 (L) 02/09/2018 0731   GLUCOSE 92 02/09/2018 0731   BUN 12 02/09/2018 0731   CREATININE 0.68 02/09/2018 0731   CALCIUM 8.4 (L) 02/09/2018 0731   GFRNONAA >60 02/09/2018 0731   GFRAA >60 02/09/2018 0731   CBC    Component Value Date/Time   WBC 6.0 02/11/2018 0643   RBC 3.94 (L) 02/11/2018 0643   HGB 12.7 (L) 02/11/2018 0643   HCT 37.9 (L) 02/11/2018 0643   PLT 231 02/11/2018 0643   MCV 96.2 02/11/2018 0643   MCH 32.2 02/11/2018 0643   MCHC 33.5 02/11/2018 0643   RDW 12.6 02/11/2018 0643   LYMPHSABS 0.6 (L) 01/22/2018 0659   MONOABS 0.9 01/22/2018 0659   EOSABS 0.5 01/22/2018 0659   BASOSABS 0.0 01/22/2018 0659    Studies/Results: No results found.  Medications: I have reviewed the patient's current medications.  Assessment/Plan:    1.  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  Status post C3-4, 4-5,  5-6, 6-7 laminectomy and posterior lateral arthrodesis.  Cervical collar.  The patient has quadriparesis.  Continue with CIR. 2.  DVT prophylaxis with Coumadin 3.  Pain management with Tylenol PRN 4.  TBI.  Continue with CIR.  On Lexapro and Seroquel for mood stabilization. 5.  Dysphagia type II.  Megace for poor appetite 6.  Atrial fibrillation.  On Coumadin 7.  Orthostatic hypotension.  Will continue Florinef and TED stockings.  Abdominal binder. 8.  Hypertension.  The dosages of Lopressor and lisinopril were reduced due to orthostasis. 9.  Neurogenic bowel and neurogenic bladder.  In and out catheterization.  Bowel program 10.  History of obstructive sleep apnea.  Monitoring for apnea at night     Length of stay, days: 23  Sonda Primes , MD 02/11/2018, 2:21 PM

## 2018-02-11 NOTE — Progress Notes (Signed)
ANTICOAGULATION CONSULT NOTE - Follow Up Consult  Pharmacy Consult for warfarin Indication: atrial fibrillation  Allergies  Allergen Reactions  . Flecainide Palpitations and Other (See Comments)    TACHYCARDIA SYNCOPE > LOC  . Sulfamethoxazole Other (See Comments)    Internal bleeding    Patient Measurements: Height: 6\' 1"  (185.4 cm) Weight: 186 lb 1.1 oz (84.4 kg) IBW/kg (Calculated) : 79.9  Vital Signs: Temp: 98.9 F (37.2 C) (09/08 0500) BP: 148/94 (09/08 0616) Pulse Rate: 61 (09/08 0616)  Labs: Recent Labs    02/09/18 0731 02/10/18 0546 02/11/18 0643  HGB 13.0  --  12.7*  HCT 37.9*  --  37.9*  PLT 258  --  231  LABPROT 33.4* 38.7* 34.4*  INR 3.32 3.99 3.44  CREATININE 0.68  --   --     Estimated Creatinine Clearance: 90.2 mL/min (by C-G formula based on SCr of 0.68 mg/dL).   Medical History: Past Medical History:  Diagnosis Date  . Atrial fibrillation (HCC)     Medications:  Medications Prior to Admission  Medication Sig Dispense Refill Last Dose  . escitalopram (LEXAPRO) 10 MG tablet Take 10 mg by mouth daily.  0 01/15/2018 at Unknown time  . lisinopril (PRINIVIL,ZESTRIL) 20 MG tablet Take 20 mg by mouth daily.  2 01/15/2018 at Unknown time  . metoprolol tartrate (LOPRESSOR) 50 MG tablet Take 75 mg by mouth daily.   0 01/15/2018 at Unknown time  . tamsulosin (FLOMAX) 0.4 MG CAPS capsule Take 0.4 mg by mouth daily with supper.  2 01/15/2018 at Unknown time  . warfarin (COUMADIN) 5 MG tablet Take 2.5-5 mg by mouth See admin instructions. Take 5 mg on Tues, Thurs. Saturday Take 2.5 mg on Mon. Wed. Fri. And Sunday Take in the Morning  0 01/15/2018 at Unknown time    Assessment: 77 y/o male on Coumadin 2.5mg  daily except for 5mg  on TTSat PTA for Afib. INR was 2.66 on admit. Kcentra given due to epidural hematoma. Restarting Coumadin on 8/19 with no bridge. 9/7 INR jumped 3.32>>3.99 and dose was held.  9/8 Today INR continues to be supratherapeutic at 3.44  despite warfarin dose being held x2 days. Patient's intake is still poor, which will effect coumadin dosing. No s/sx of bleeding noted. Will hold again tonight and anticipate restarting warfarin at reduced dose 9/9  Goal of Therapy:  INR 2-3 Monitor platelets by anticoagulation protocol: Yes   Plan:  No coumadin again today Will continue to monitor daily INR, CBC, s/sx of bleeding, and PO intake  Thank you for involving pharmacy in this patient's care.  Wendelyn Breslow, PharmD PGY1 Pharmacy Resident Phone: [phone] 02/11/2018 10:40 AM

## 2018-02-11 NOTE — Progress Notes (Signed)
Patient continued to call out last night. He awoke at approximately 4am & was calling out into the hallway, was seen hitting his IV pump & talking to unseen people. No signs of acute distress noted.

## 2018-02-11 NOTE — Progress Notes (Signed)
Pt has been yelling and agitated most of the shift. Each time pt has c/o pain to the shoulders, back and legs. Tylenol did not decrease pain.

## 2018-02-12 ENCOUNTER — Inpatient Hospital Stay (HOSPITAL_COMMUNITY): Payer: Medicare Other | Admitting: Occupational Therapy

## 2018-02-12 ENCOUNTER — Inpatient Hospital Stay (HOSPITAL_COMMUNITY): Payer: Medicare Other | Admitting: Physical Therapy

## 2018-02-12 ENCOUNTER — Inpatient Hospital Stay (HOSPITAL_COMMUNITY): Payer: Medicare Other

## 2018-02-12 DIAGNOSIS — A499 Bacterial infection, unspecified: Secondary | ICD-10-CM

## 2018-02-12 LAB — CBC
HEMATOCRIT: 40 % (ref 39.0–52.0)
Hemoglobin: 13.7 g/dL (ref 13.0–17.0)
MCH: 32.5 pg (ref 26.0–34.0)
MCHC: 34.3 g/dL (ref 30.0–36.0)
MCV: 95 fL (ref 78.0–100.0)
PLATELETS: 253 10*3/uL (ref 150–400)
RBC: 4.21 MIL/uL — AB (ref 4.22–5.81)
RDW: 12.9 % (ref 11.5–15.5)
WBC: 6.2 10*3/uL (ref 4.0–10.5)

## 2018-02-12 LAB — PROTIME-INR
INR: 3.06
Prothrombin Time: 31.4 seconds — ABNORMAL HIGH (ref 11.4–15.2)

## 2018-02-12 MED ORDER — AMOXICILLIN 250 MG PO CAPS
500.0000 mg | ORAL_CAPSULE | Freq: Three times a day (TID) | ORAL | Status: AC
  Administered 2018-02-12 – 2018-02-19 (×24): 500 mg via ORAL
  Filled 2018-02-12 (×26): qty 2

## 2018-02-12 MED ORDER — WARFARIN SODIUM 1 MG PO TABS
1.0000 mg | ORAL_TABLET | Freq: Once | ORAL | Status: AC
  Administered 2018-02-12: 1 mg via ORAL
  Filled 2018-02-12: qty 1

## 2018-02-12 NOTE — Progress Notes (Signed)
Physical Therapy Weekly Progress Note  Patient Details  Name: Brandon [name] MRN: [id number] Date of Birth: 1941-01-04  Beginning of progress report period: February 05, 2018 End of progress report period: February 12, 2018  Today's Date: 02/12/2018 PT Individual Time: 1100-1200 PT Individual Time Calculation (min): 60 min   Patient has met 0 of 4 short term goals.  Pt continues to be limited in therapy by changes in level of arousal with postural changes.  Difficult to maintain BP with thigh high teds, aces, and abdominal binder with orthostatic changes noted with supine>sit and decreased arousal in sitting position.  Pt also continues to demonstrate severe cognitive deficits which limit any carry over within session for education.  Pt continues to require total > +2 assist for all mobility.    Patient continues to demonstrate the following deficits muscle weakness and muscle paralysis, impaired timing and sequencing, abnormal tone, unbalanced muscle activation and decreased coordination, decreased motor planning, decreased initiation, decreased attention, decreased awareness, decreased problem solving, decreased safety awareness, decreased memory and delayed processing and decreased sitting balance, decreased standing balance, decreased postural control, decreased balance strategies and tetraplegia and therefore will continue to benefit from skilled PT intervention to increase functional independence with mobility.  Patient goals were downgraded on 9/5 to reflect progression from +2 caregiver assist to +1 caregiver assist in preparation for transition to SNF for further rehab at d/c from CIR.Marland Kitchen  Continue plan of care.  PT Short Term Goals Week 4:  PT Short Term Goal 1 (Week 4): Pt will maintain arousal for 3 minutes with min cues.  PT Short Term Goal 2 (Week 4): Pt will roll L and R in bed using UE hooking with consistent mod assist.  PT Short Term Goal 3 (Week 4): Pt will tolerate out of bed at  30 degrees of tilt in chair without orthostatic change in blood pressure from supine.    Skilled Therapeutic Interventions/Progress Updates:    no c/o pain at rest but does grimace some with supine>sit.  Pain monitored throughout session.  Session focus on increasing pt independence with functional mobility, sitting balance, and functional stretch positions.    Pt alert in bed on PT arrival, BP 103/71, conversing with PT with occasional language of confusion and tangential but easily re-directed.  Rolling L<>R for total assist LB dressing.  Pt able to hook UEs onto bedrails with manual facilitation and roll with min assist this session.  Supine>sit total assist to bring LEs off EOB and to elevate trunk with HOB elevated.  Slide board transfers throughout session with total +2 assist, no effort perceived from pt, and pt with strong adductor/flexor tone noted in BLEs with transfer.  Sitting balance edge of mat focus on use of core musculature and compensatory reaching/pushing with UEs to transition from reclined<>midline, and R/L elbow<>midline.  Pt progresses from requiring max assist for return to midline from elbows, to min assist.  Transition to long sitting for PROM stretching to tolerance with pt following 1-step commands for reaching UEs towards toes, occasional min reminders to attend to task.  Transition to circle sitting with assist to position LEs and cues for reaching for ankles with UEs.  Pt again requires cues to attend to task but able to follow 1-step directions.  Pt returned to w/c at end of session in same manner as above, positioned in room with w/c tilted back for out of bed tolerance, chair alarm intact, call bell in reach and needs met.   Therapy Documentation  Precautions:  Precautions Precautions: Fall, Cervical Required Braces or Orthoses: Cervical Brace Cervical Brace: Hard collar, Other (comment)(Don/doff in sitting. Ok to doff in bed, when ambulating to bathroom, and when  showering per MD order) Restrictions Weight Bearing Restrictions: No General:   Vital Signs: Therapy Vitals Temp: 97.6 F (36.4 C) Temp Source: Oral Pulse Rate: 78 Resp: 16 BP: (!) 134/93 Patient Position (if appropriate): Lying Oxygen Therapy SpO2: 98 % O2 Device: Room Air Pain: Pain Assessment Pain Scale: 0-10 Pain Score: 0-No pain Vision/Perception     Mobility:   Locomotion :    Trunk/Postural Assessment :    Balance:   Exercises:   Other Treatments:     See Function Navigator for Current Functional Status.  Therapy/Group: Individual Therapy  [name] 02/12/2018, 12:11 PM

## 2018-02-12 NOTE — Progress Notes (Signed)
Speech Language Pathology Daily Session Note  Patient Details  Name: Brandon [name] MRN: [id number] Date of Birth: 02-19-1941  Today's Date: 02/12/2018 SLP Individual Time: 1345-1430 SLP Individual Time Calculation (min): 45 min  Short Term Goals: Week 4: SLP Short Term Goal 1 (Week 4): Pt will sustain attention to basic familiar tasks for ~ 5 minutes with Max A cues.  SLP Short Term Goal 2 (Week 4): Pt will communicate intelligible basic wants and needs with Mod A.  SLP Short Term Goal 3 (Week 4): Given 2 choices related to orientation information, pt will select correct choice in 8 of 10 opportunities with Max A cues.   Skilled Therapeutic Interventions:Skilled ST services focused on cognitive skills. SLP facilitated orientation of place, situation with mod A binary choice cues with 80% accuracy. SLP facilitated sustained attention skills utilizing picture cards for verbal descriptions, pt maintained attention in 5 minute intervals with mod-min A verbal cues for redirection, although reduce tolernace as session continued requiring max A verbal cues due to distraction from pain. Pt demonstrated  ability to express wants/needs requesting reposition, ice and relaxing music with mod A verbal cues for intelligibility, with moderate language of confusion noted. SLP notified nurse of pain medication request. Pt was left in room with call bell within reach and bed alarm set. Recommend to continue skilled ST services.      Function:  Eating Eating                 Cognition Comprehension Comprehension assist level: Understands basic 25 - 49% of the time/ requires cueing 50 - 75% of the time;Understands basic 50 - 74% of the time/ requires cueing 25 - 49% of the time  Expression   Expression assist level: Expresses basic 50 - 74% of the time/requires cueing 25 - 49% of the time. Needs to repeat parts of sentences.  Social Interaction Social Interaction assist level: Interacts appropriately 25 -  49% of time - Needs frequent redirection.  Problem Solving Problem solving assist level: Solves basic less than 25% of the time - needs direction nearly all the time or does not effectively solve problems and may need a restraint for safety;Solves basic 25 - 49% of the time - needs direction more than half the time to initiate, plan or complete simple activities  Memory Memory assist level: Recognizes or recalls less than 25% of the time/requires cueing greater than 75% of the time;Recognizes or recalls 25 - 49% of the time/requires cueing 50 - 75% of the time    Pain Pain Assessment Faces Pain Scale: Hurts even more Pain Type: Acute pain;Chronic pain Pain Location: Shoulder Pain Orientation: Posterior;Right Pain Intervention(s): RN made aware;Cold applied;Relaxation;Repositioned  Therapy/Group: Individual Therapy  Leslieanne Cobarrubias  [facility] 02/12/2018, 2:44 PM
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
## 2018-02-12 NOTE — Progress Notes (Signed)
ANTICOAGULATION CONSULT NOTE - Follow Up Consult  Pharmacy Consult for warfarin Indication: atrial fibrillation  Allergies  Allergen Reactions  . Flecainide Palpitations and Other (See Comments)    TACHYCARDIA SYNCOPE > LOC  . Sulfamethoxazole Other (See Comments)    Internal bleeding    Patient Measurements: Height: 6\' 1"  (185.4 cm) Weight: 186 lb 1.1 oz (84.4 kg) IBW/kg (Calculated) : 79.9  Vital Signs: Temp: 98.1 F (36.7 C) (09/09 0842) Temp Source: Oral (09/09 0842) BP: 162/117 (09/09 0842) Pulse Rate: 75 (09/09 0842)  Labs: Recent Labs    02/10/18 0546 02/11/18 0643 02/12/18 0641  HGB  --  12.7* 13.7  HCT  --  37.9* 40.0  PLT  --  231 253  LABPROT 38.7* 34.4* 31.4*  INR 3.99 3.44 3.06    Estimated Creatinine Clearance: 90.2 mL/min (by C-G formula based on SCr of 0.68 mg/dL).   Medical History: Past Medical History:  Diagnosis Date  . Atrial fibrillation (HCC)     Medications:  Medications Prior to Admission  Medication Sig Dispense Refill Last Dose  . escitalopram (LEXAPRO) 10 MG tablet Take 10 mg by mouth daily.  0 01/15/2018 at Unknown time  . lisinopril (PRINIVIL,ZESTRIL) 20 MG tablet Take 20 mg by mouth daily.  2 01/15/2018 at Unknown time  . metoprolol tartrate (LOPRESSOR) 50 MG tablet Take 75 mg by mouth daily.   0 01/15/2018 at Unknown time  . tamsulosin (FLOMAX) 0.4 MG CAPS capsule Take 0.4 mg by mouth daily with supper.  2 01/15/2018 at Unknown time  . warfarin (COUMADIN) 5 MG tablet Take 2.5-5 mg by mouth See admin instructions. Take 5 mg on Tues, Thurs. Saturday Take 2.5 mg on Mon. Wed. Fri. And Sunday Take in the Morning  0 01/15/2018 at Unknown time    Assessment: 77 y/o male on Coumadin 2.5mg  daily except for 5mg  on TTSat PTA for Afib. INR was 2.66 on admit. Kcentra given due to epidural hematoma. Restarting Coumadin on 8/19 with no bridge. 9/6 INR jumped 2.28>3.32 and dose was held.  9/7 INR has trended down to 3.06. We will give a low  dose coumadin today. Patient's intake is still poor, which will effect coumadin dosing. Per RN no s/s of bleeding reported by nurse.  Goal of Therapy:  INR 2-3 Monitor platelets by anticoagulation protocol: Yes   Plan:  Coumadin 1mg  PO today Will continue to monitor daily INR, CBC, s/sx of bleeding, and PO intake  Ulyses Southward, PharmD, [employer], AAHIVP, CPP Infectious Disease Pharmacist Pager: [phone] 02/12/2018 8:56 AM

## 2018-02-12 NOTE — Progress Notes (Signed)
Occupational Therapy Session Note  Patient Details  Name: Brandon [name] MRN: [id number] Date of Birth: 1941/06/02  Today's Date: 02/12/2018 OT Individual Time: 0800-0900 OT Individual Time Calculation (min): 60 min    Short Term Goals: Week 3:  OT Short Term Goal 1 (Week 3): STGS=LTGs secondary to upcoming discharge  Skilled Therapeutic Interventions/Progress Updates:    Upon entering the room, pt in side lying position sleeping soundly. OT unable to arouse pt for participation. Pt was found to have been incontinent of BM this session. Pt required +2 assistance to roll  L <> R secondary to lethargy. Pt did not wake up during hygiene and clothing management. When pt returned to supine position, pt opening eyes and verbalizing visual hallucinations of "animals on the walls". Pt stating he felt "scared" and then returning to sleep. OT threading B LEs with thigh high TED hose and ACE wraps for BP management. Pt's BP taken while in supine with results of 162/117. NT present in the room and requesting assisting with position of pt for I and O cath. Pt not active participant in therapy session secondary to lethargy this session. Pt remained in bed with bed alarm activated, bed rails up, soft call bell within reach, and matts placed on floor for safety.    Therapy Documentation Precautions:  Precautions Precautions: Fall, Cervical Required Braces or Orthoses: Cervical Brace Cervical Brace: Hard collar, Other (comment)(Don/doff in sitting. Ok to doff in bed, when ambulating to bathroom, and when showering per MD order) Restrictions Weight Bearing Restrictions: No General:   Vital Signs: Therapy Vitals Temp: 97.6 F (36.4 C) Temp Source: Oral Pulse Rate: 78 Resp: 16 BP: (!) 134/93 Patient Position (if appropriate): Lying Oxygen Therapy SpO2: 98 % O2 Device: Room Air Pain: Pain Assessment Pain Scale: 0-10 Pain Score: 0-No pain ADL: ADL ADL Comments: Please see functional navigator for  ADL status  See Function Navigator for Current Functional Status.   Therapy/Group: Individual Therapy  Alen Bleacher 02/12/2018, 10:29 AM

## 2018-02-12 NOTE — Progress Notes (Signed)
Occupational Therapy Session Note  Patient Details  Name: Brandon [name] MRN: [id number] Date of Birth: 1940-07-27  Today's Date: 02/12/2018 OT Individual Time: 6606-3016 OT Individual Time Calculation (min): 30 min    Short Term Goals: Week 3:  OT Short Term Goal 1 (Week 3): STGS=LTGs secondary to upcoming discharge  Skilled Therapeutic Interventions/Progress Updates:    Pt received supine in bed agreeable to therapy session. Pt with fluctuating lethargy during session, but able to arouse easily. Pt reports pain in RUE with certain movements, alleviated with rest and repositioning. Pt initially engaged in P/AAROM to bil UEs at bed level both against gravity and with gravity eliminated. Pt performing simple grooming ADLs using LUE with mod-max hand over hand assist for task completion. BP at start of session 132/79, elevated HOB to approx 50* during session with BP 128/83. Pt left supine in bed end of session, call bell and needs within reach.   Therapy Documentation Precautions:  Precautions Precautions: Fall, Cervical Required Braces or Orthoses: Cervical Brace Cervical Brace: Hard collar, Other (comment)(Don/doff in sitting. Ok to doff in bed, when ambulating to bathroom, and when showering per MD order) Restrictions Weight Bearing Restrictions: No    ADL: ADL ADL Comments: Please see functional navigator for ADL status     See Function Navigator for Current Functional Status.   Therapy/Group: Individual Therapy  Orlando Penner 02/12/2018, 11:18 AM

## 2018-02-12 NOTE — Progress Notes (Signed)
Physical Therapy Session Note  Patient Details  Name: Brandon [name] MRN: [id number] Date of Birth: 1940/11/15  Today's Date: 02/12/2018 PT Individual Time: 1500-1530 PT Individual Time Calculation (min): 30 min   Short Term Goals: Week 4:  PT Short Term Goal 1 (Week 4): Pt will maintain arousal for 3 minutes with min cues.  PT Short Term Goal 2 (Week 4): Pt will roll L and R in bed using UE hooking with consistent mod assist.  PT Short Term Goal 3 (Week 4): Pt will tolerate out of bed at 30 degrees of tilt in chair without orthostatic change in blood pressure from supine.    Skilled Therapeutic Interventions/Progress Updates:    c/o discomfort on entering room but improved with repositioning in w/c.  Session focus on upright tolerance and weight bearing in standing frame.  Pt requires +2 assist to manage UEs when transitioning sit<>stand in standing frame.  Able to tolerate standing x2 minutes +1 minute.  BP not assessed this session, but pt stays alert and conversive (though not appropriately so) throughout session, even in standing.  Pt returned to room at end of session and positioned tilted back in w/c, NT in to assist pt back to bed for cathing and daughter present at bedside.   Therapy Documentation Precautions:  Precautions Precautions: Fall, Cervical Required Braces or Orthoses: Cervical Brace Cervical Brace: Hard collar, Other (comment)(Don/doff in sitting. Ok to doff in bed, when ambulating to bathroom, and when showering per MD order) Restrictions Weight Bearing Restrictions: No   See Function Navigator for Current Functional Status.   Therapy/Group: Individual Therapy  Stephania Fragmin 02/12/2018, 3:32 PM

## 2018-02-13 ENCOUNTER — Inpatient Hospital Stay (HOSPITAL_COMMUNITY): Payer: Medicare Other

## 2018-02-13 ENCOUNTER — Inpatient Hospital Stay (HOSPITAL_COMMUNITY): Payer: Medicare Other | Admitting: Physical Therapy

## 2018-02-13 ENCOUNTER — Inpatient Hospital Stay (HOSPITAL_COMMUNITY): Payer: Medicare Other | Admitting: Occupational Therapy

## 2018-02-13 LAB — CBC
HEMATOCRIT: 37.7 % — AB (ref 39.0–52.0)
Hemoglobin: 12.6 g/dL — ABNORMAL LOW (ref 13.0–17.0)
MCH: 32 pg (ref 26.0–34.0)
MCHC: 33.4 g/dL (ref 30.0–36.0)
MCV: 95.7 fL (ref 78.0–100.0)
Platelets: 231 10*3/uL (ref 150–400)
RBC: 3.94 MIL/uL — ABNORMAL LOW (ref 4.22–5.81)
RDW: 12.8 % (ref 11.5–15.5)
WBC: 6.1 10*3/uL (ref 4.0–10.5)

## 2018-02-13 LAB — PROTIME-INR
INR: 2.75
Prothrombin Time: 28.8 seconds — ABNORMAL HIGH (ref 11.4–15.2)

## 2018-02-13 MED ORDER — WARFARIN SODIUM 2.5 MG PO TABS
2.5000 mg | ORAL_TABLET | Freq: Once | ORAL | Status: AC
  Administered 2018-02-13: 2.5 mg via ORAL
  Filled 2018-02-13: qty 1

## 2018-02-13 NOTE — Progress Notes (Signed)
Physical Therapy Session Note  Patient Details  Name: Brandon [name] MRN: [id number] Date of Birth: [birth year]  Today's Date: 02/13/2018 PT Individual Time: 1045-1140 PT Individual Time Calculation (min): 55 min   Short Term Goals: Week 4:  PT Short Term Goal 1 (Week 4): Pt will maintain arousal for 3 minutes with min cues.  PT Short Term Goal 2 (Week 4): Pt will roll L and R in bed using UE hooking with consistent mod assist.  PT Short Term Goal 3 (Week 4): Pt will tolerate out of bed at 30 degrees of tilt in chair without orthostatic change in blood pressure from supine.    Skilled Therapeutic Interventions/Progress Updates:    no c/o pain.  Session focus on sitting balance initially, fade to focusing on upright tolerance given significant fluctuations in blood pressure.  Pt transferred w/c<>therapy mat with +2 for slide board transfer.  Pt able to initiate forward weight shift on first transfer, but not on second 2/2 blood pressure levels.  Sitting balance edge of mat focus on forward reaching with UEs to engage core and find balance point.  Pt able to reach for 2 horseshoes before becoming unresponsive.  BP assessed in sitting and pt returned to supine with LEs propped on ball.  Pt returned to prior level of arousal within 2 minutes and able to hold appropriate conversation with PT.  3x10 reps of knee and hip flex/extension with LEs propped on ball focus on motor control and strengthening.  Pt returned to w/c with +2, again becomes somnolent with upright positioning.  Tilted back in w/c and positioned in nursing station with chair alarm intact and needs met.   BP:  Sitting edge of mat: 57/42 Supine with LEs propped: 109/68 Supine with LEs propped at 5 minutes: 105/86 Immediately after sitting: 117/88 After mat>w/c transfer: 84/59  Therapy Documentation Precautions:  Precautions Precautions: Fall, Cervical Required Braces or Orthoses: Cervical Brace Cervical Brace: Hard collar, Other  (comment)(Don/doff in sitting. Ok to doff in bed, when ambulating to bathroom, and when showering per MD order) Restrictions Weight Bearing Restrictions: No General: PT Amount of Missed Time (min): 20 Minutes PT Missed Treatment Reason: Other (Comment)(BP)   See Function Navigator for Current Functional Status.   Therapy/Group: Individual Therapy  [name] 02/13/2018, 12:03 PM

## 2018-02-13 NOTE — Progress Notes (Signed)
Speech Language Pathology Daily Session Note  Patient Details  Name: Brandon [name] MRN: [id number] Date of Birth: 07/28/1940  Today's Date: 02/13/2018 SLP Individual Time: 0930-1015 SLP Individual Time Calculation (min): 45 min  Short Term Goals: Week 4: SLP Short Term Goal 1 (Week 4): Pt will sustain attention to basic familiar tasks for ~ 5 minutes with Max A cues.  SLP Short Term Goal 2 (Week 4): Pt will communicate intelligible basic wants and needs with Mod A.  SLP Short Term Goal 3 (Week 4): Given 2 choices related to orientation information, pt will select correct choice in 8 of 10 opportunities with Max A cues.   Skilled Therapeutic Interventions:Skilled ST services focused on cognitive skills. Pt requested to use the bathroom, however upon questing had already produced a BM, expressing wants and needs min A. SLP facilitated transfer from tilting WC to bed with maximove and nurse tech. To change brief. Pt demonstrated ability to follow basic commands, however sustained attention required max A verbal cues. SLP facilitated recall of orientation when back in tilting WC, pt required binary choices and visual aids with mod A verbal cues for 80% accuracy. Pt was returned to nurse's station. Pt expressed need to urinate and burning sensation, SLP notified nursing staff. ST reccomends to continue skilled ST services.     Function:  Eating Eating                 Cognition Comprehension Comprehension assist level: Understands basic 25 - 49% of the time/ requires cueing 50 - 75% of the time  Expression   Expression assist level: Expresses basic 50 - 74% of the time/requires cueing 25 - 49% of the time. Needs to repeat parts of sentences.  Social Interaction Social Interaction assist level: Interacts appropriately 25 - 49% of time - Needs frequent redirection.  Problem Solving Problem solving assist level: Solves basic 25 - 49% of the time - needs direction more than half the time to  initiate, plan or complete simple activities;Solves basic less than 25% of the time - needs direction nearly all the time or does not effectively solve problems and may need a restraint for safety  Memory Memory assist level: Recognizes or recalls less than 25% of the time/requires cueing greater than 75% of the time    Pain Pain Assessment Pain Scale: 0-10 Pain Score: 0-No pain Faces Pain Scale: Hurts even more Pain Type: Acute pain Pain Location: Scrotum Pain Descriptors / Indicators: Burning Pain Onset: Progressive Pain Intervention(s): RN made aware  Therapy/Group: Individual Therapy  Brandon [name]  [facility] 02/13/2018, 12:54 PM

## 2018-02-13 NOTE — Progress Notes (Signed)
ANTICOAGULATION CONSULT NOTE - Follow Up Consult  Pharmacy Consult for warfarin Indication: atrial fibrillation  Allergies  Allergen Reactions  . Flecainide Palpitations and Other (See Comments)    TACHYCARDIA SYNCOPE > LOC  . Sulfamethoxazole Other (See Comments)    Internal bleeding    Patient Measurements: Height: 6\' 1"  (185.4 cm) Weight: 186 lb 1.1 oz (84.4 kg) IBW/kg (Calculated) : 79.9  Vital Signs: Temp: 99 F (37.2 C) (09/10 0516) Temp Source: Oral (09/10 0516) BP: 146/86 (09/10 0516) Pulse Rate: 63 (09/10 0516)  Labs: Recent Labs    02/11/18 0643 02/12/18 0641 02/13/18 0732  HGB 12.7* 13.7 12.6*  HCT 37.9* 40.0 37.7*  PLT 231 253 231  LABPROT 34.4* 31.4* 28.8*  INR 3.44 3.06 2.75    Estimated Creatinine Clearance: 90.2 mL/min (by C-G formula based on SCr of 0.68 mg/dL).   Medical History: Past Medical History:  Diagnosis Date  . Atrial fibrillation (HCC)     Medications:  Medications Prior to Admission  Medication Sig Dispense Refill Last Dose  . escitalopram (LEXAPRO) 10 MG tablet Take 10 mg by mouth daily.  0 01/15/2018 at Unknown time  . lisinopril (PRINIVIL,ZESTRIL) 20 MG tablet Take 20 mg by mouth daily.  2 01/15/2018 at Unknown time  . metoprolol tartrate (LOPRESSOR) 50 MG tablet Take 75 mg by mouth daily.   0 01/15/2018 at Unknown time  . tamsulosin (FLOMAX) 0.4 MG CAPS capsule Take 0.4 mg by mouth daily with supper.  2 01/15/2018 at Unknown time  . warfarin (COUMADIN) 5 MG tablet Take 2.5-5 mg by mouth See admin instructions. Take 5 mg on Tues, Thurs. Saturday Take 2.5 mg on Mon. Wed. Fri. And Sunday Take in the Morning  0 01/15/2018 at Unknown time    Assessment: 77 y/o male on Coumadin 2.5mg  daily except for 5mg  on TTSat PTA for Afib. INR was 2.66 on admit. Kcentra given due to epidural hematoma. Restarting Coumadin on 8/19 with no bridge. 9/6 INR jumped 2.28>3.32 and dose was held.  INR is therapeutic today at 2.75. Patient's intake is  still poor at 50%, which will effect coumadin dosing. Per RN no s/s of bleeding reported by nurse.  Goal of Therapy:  INR 2-3 Monitor platelets by anticoagulation protocol: Yes   Plan:  Coumadin 2.5mg  PO today Will continue to monitor daily INR, CBC, s/sx of bleeding, and PO intake  Ulyses Southward, PharmD, [place], AAHIVP, CPP Infectious Disease Pharmacist Pager: [phone] 02/13/2018 8:42 AM
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
## 2018-02-13 NOTE — Progress Notes (Signed)
Occupational Therapy Weekly Progress Note  Patient Details  Name: Brandon [name] MRN: [id number] Date of Birth: [birth year]  Beginning of progress report period: February 07, 2018 End of progress report period: February 13, 2018  Today's Date: 02/13/2018 OT Individual Time: 4562-5638 OT Individual Time Calculation (min): 69 min    Patient has met 0 of 9 long term goals. Pt making limited progress this week towards goals secondary to decreased arousal and hypotension. Pt continues to require max - total A for slide board transfers. Total A for LB and UB self care from bed level. Pt has significant cognitive deficits.   Patient continues to demonstrate the following deficits: muscle weakness, decreased cardiorespiratoy endurance, abnormal tone, unbalanced muscle activation, ataxia, decreased coordination and decreased motor planning, decreased initiation, decreased attention, decreased awareness, decreased problem solving, decreased safety awareness, decreased memory and delayed processing and decreased sitting balance, decreased standing balance, decreased postural control and decreased balance strategies and therefore will continue to benefit from skilled OT intervention to enhance overall performance with BADL, iADL and Reduce care partner burden.  See Patient's Care Plan for progression toward long term goals.  Patient progressing toward long term goals..  Continue plan of care.  Skilled Therapeutic Interventions/Progress Updates:    Upon entering the room, pt alert and supine in bed. OT provided pt with donning B TEDs, ACE wraps, and abdominal binder. Pt incontinent of BM this session and rolling L <> R with total A for hygiene and clothing management. Pt unaware of BM. Pt's bed inclined to 30 degrees with pt remaining 137/100. Supine with total A and BP 124/92. Slide board transfer with +2 assistance for safety. After sitting in wheelchair for 5 minutes BP continues WNL of 133/85. Pt tilted  back in wheelchair and returned to RN station for safety.   Therapy Documentation Precautions:  Precautions Precautions: Fall, Cervical Required Braces or Orthoses: Cervical Brace Cervical Brace: Hard collar, Other (comment)(Don/doff in sitting. Ok to doff in bed, when ambulating to bathroom, and when showering per MD order) Restrictions Weight Bearing Restrictions: No General: General PT Missed Treatment Reason: Other (Comment)(BP) Pain: Pain Assessment Pain Scale: 0-10 Pain Score: 0-No pain Faces Pain Scale: Hurts even more Pain Type: Acute pain Pain Location: Scrotum Pain Descriptors / Indicators: Burning Pain Onset: Progressive Pain Intervention(s): RN made aware ADL: ADL ADL Comments: Please see functional navigator for ADL status Vision   Perception    Praxis   Exercises:   Other Treatments:    See Function Navigator for Current Functional Status.   Therapy/Group: Individual Therapy  Gypsy Decant 02/13/2018, 12:49 PM

## 2018-02-14 ENCOUNTER — Inpatient Hospital Stay (HOSPITAL_COMMUNITY): Payer: Medicare Other | Admitting: Physical Therapy

## 2018-02-14 ENCOUNTER — Inpatient Hospital Stay (HOSPITAL_COMMUNITY): Payer: Medicare Other | Admitting: Occupational Therapy

## 2018-02-14 ENCOUNTER — Inpatient Hospital Stay (HOSPITAL_COMMUNITY): Payer: Medicare Other

## 2018-02-14 LAB — PROTIME-INR
INR: 2.68
PROTHROMBIN TIME: 28.3 s — AB (ref 11.4–15.2)

## 2018-02-14 LAB — CBC
HCT: 38 % — ABNORMAL LOW (ref 39.0–52.0)
Hemoglobin: 12.9 g/dL — ABNORMAL LOW (ref 13.0–17.0)
MCH: 31.9 pg (ref 26.0–34.0)
MCHC: 33.9 g/dL (ref 30.0–36.0)
MCV: 94.1 fL (ref 78.0–100.0)
PLATELETS: 215 10*3/uL (ref 150–400)
RBC: 4.04 MIL/uL — AB (ref 4.22–5.81)
RDW: 12.6 % (ref 11.5–15.5)
WBC: 6.1 10*3/uL (ref 4.0–10.5)

## 2018-02-14 MED ORDER — MIDODRINE HCL 5 MG PO TABS
2.5000 mg | ORAL_TABLET | Freq: Two times a day (BID) | ORAL | Status: DC
  Administered 2018-02-14 – 2018-02-21 (×14): 2.5 mg via ORAL
  Filled 2018-02-14 (×14): qty 1

## 2018-02-14 MED ORDER — WARFARIN SODIUM 2.5 MG PO TABS
2.5000 mg | ORAL_TABLET | Freq: Once | ORAL | Status: AC
  Administered 2018-02-14: 2.5 mg via ORAL
  Filled 2018-02-14: qty 1

## 2018-02-14 NOTE — Progress Notes (Signed)
Occupational Therapy Session Note  Patient Details  Name: Brandon [name] MRN: [id number] Date of Birth: 1940/10/20  Today's Date: 02/14/2018 OT Individual Time: 1103-1594 OT Individual Time Calculation (min): 25 min  and Today's Date: 02/14/2018 OT Missed Time: 55 Minutes Missed Time Reason: Patient fatigue   Short Term Goals: Week 4:  OT Short Term Goal 1 (Week 4): Pt will maintain sustained focus for 5 minutes during functional task.  OT Short Term Goal 2 (Week 4): Pt will verbalize hygiene needs to staff when given min verbal questioning cues.  Skilled Therapeutic Interventions/Progress Updates:    Upon entering the room, pt sitting in bed with HOB at 55 degrees. Pt sleeping soundly. OT attempting to wake pt who opened eyes once and demanded tomato soup and asked about ski trip. OT orienting pt as he is only oriented to himself at this time. Pt promptly falling asleep and OT unable to arouse pt further for participation. OT donned B TED hose and ACE wraps to assist pt with BP. Bed lowered, bed alarm set, and matts places on floor for pt safety. Call bell and all needed items within reach.   Therapy Documentation Precautions:  Precautions Precautions: Fall, Cervical Required Braces or Orthoses: Cervical Brace Cervical Brace: Hard collar, Other (comment)(Don/doff in sitting. Ok to doff in bed, when ambulating to bathroom, and when showering per MD order) Restrictions Weight Bearing Restrictions: No General: General OT Amount of Missed Time: 55 Minutes Vital Signs: Therapy Vitals Temp: 98.1 F (36.7 C) Temp Source: Oral Pulse Rate: 100 Resp: 18 BP: 133/89 Patient Position (if appropriate): Sitting Oxygen Therapy SpO2: 97 % O2 Device: Room Air Pain:   ADL: ADL ADL Comments: Please see functional navigator for ADL status  See Function Navigator for Current Functional Status.   Therapy/Group: Individual Therapy  Alen Bleacher 02/14/2018, 8:51 AM

## 2018-02-14 NOTE — Progress Notes (Signed)
ANTICOAGULATION CONSULT NOTE - Follow Up Consult  Pharmacy Consult for warfarin Indication: atrial fibrillation  Allergies  Allergen Reactions  . Flecainide Palpitations and Other (See Comments)    TACHYCARDIA SYNCOPE > LOC  . Sulfamethoxazole Other (See Comments)    Internal bleeding    Patient Measurements: Height: 6\' 1"  (185.4 cm) Weight: 185 lb 10 oz (84.2 kg) IBW/kg (Calculated) : 79.9  Vital Signs: Temp: 98.1 F (36.7 C) (09/11 0555) Temp Source: Oral (09/11 0555) BP: 133/89 (09/11 0846) Pulse Rate: 100 (09/11 0555)  Labs: Recent Labs    02/12/18 0641 02/13/18 0732 02/14/18 0507  HGB 13.7 12.6* 12.9*  HCT 40.0 37.7* 38.0*  PLT 253 231 215  LABPROT 31.4* 28.8* 28.3*  INR 3.06 2.75 2.68    Estimated Creatinine Clearance: 90.2 mL/min (by C-G formula based on SCr of 0.68 mg/dL).   Medical History: Past Medical History:  Diagnosis Date  . Atrial fibrillation (HCC)     Medications:  Medications Prior to Admission  Medication Sig Dispense Refill Last Dose  . escitalopram (LEXAPRO) 10 MG tablet Take 10 mg by mouth daily.  0 01/15/2018 at Unknown time  . lisinopril (PRINIVIL,ZESTRIL) 20 MG tablet Take 20 mg by mouth daily.  2 01/15/2018 at Unknown time  . metoprolol tartrate (LOPRESSOR) 50 MG tablet Take 75 mg by mouth daily.   0 01/15/2018 at Unknown time  . tamsulosin (FLOMAX) 0.4 MG CAPS capsule Take 0.4 mg by mouth daily with supper.  2 01/15/2018 at Unknown time  . warfarin (COUMADIN) 5 MG tablet Take 2.5-5 mg by mouth See admin instructions. Take 5 mg on Tues, Thurs. Saturday Take 2.5 mg on Mon. Wed. Fri. And Sunday Take in the Morning  0 01/15/2018 at Unknown time    Assessment: 77 y/o male on Coumadin 2.5mg  daily except for 5mg  on TTSat PTA for Afib. INR was 2.66 on admit. Kcentra given due to epidural hematoma. Restarting Coumadin on 8/19 with no bridge. 9/6 INR jumped 2.28>3.32 and dose was held.  INR is therapeutic today at 2.68. Patient's intake is  still poor at 30%, which will effect coumadin dosing.   Goal of Therapy:  INR 2-3 Monitor platelets by anticoagulation protocol: Yes   Plan:  Coumadin 2.5mg  PO today Will continue to monitor daily INR, CBC, s/sx of bleeding, and PO intake  Ulyses Southward, PharmD, [place], AAHIVP, CPP Infectious Disease Pharmacist Pager: [phone] 02/14/2018 9:28 AM

## 2018-02-14 NOTE — Progress Notes (Signed)
Physical Therapy Session Note  Patient Details  Name: Brandon [name] MRN: [id number] Date of Birth: March 18, 1941  Today's Date: 02/14/2018 PT Individual Time: 1505-6979 PT Individual Time Calculation (min): 42 min   Short Term Goals: Week 4:  PT Short Term Goal 1 (Week 4): Pt will maintain arousal for 3 minutes with min cues.  PT Short Term Goal 2 (Week 4): Pt will roll L and R in bed using UE hooking with consistent mod assist.  PT Short Term Goal 3 (Week 4): Pt will tolerate out of bed at 30 degrees of tilt in chair without orthostatic change in blood pressure from supine.    Skilled Therapeutic Interventions/Progress Updates:    c/o pain in shoulders but not able to rate.  Pt lethargic throughout session, incontinent of mucous bowel x1 and incontinent of foul smelling urine x4 during session.  Session focus on rolling R<>L for peri-care and brief/clothing management.  Pt intermittently requiring +2 to roll 2/2 arousal and LE tone.  At best, able to roll with mod assist to R.  BP assessed throughout session and WNL, but pt remains somnolent by end of session.  Reapplied ace wraps and abdominal binder with total assist.  Positioned with HOB to 45 with no change in arousal.  Left upright with call bell in reach and needs met.  Missed 18 minutes due to lethargy/somnolence.   Therapy Documentation Precautions:  Precautions Precautions: Fall, Cervical Required Braces or Orthoses: Cervical Brace Cervical Brace: Hard collar, Other (comment)(Don/doff in sitting. Ok to doff in bed, when ambulating to bathroom, and when showering per MD order) Restrictions Weight Bearing Restrictions: No General: PT Amount of Missed Time (min): 18 Minutes PT Missed Treatment Reason: Patient fatigue   See Function Navigator for Current Functional Status.   Therapy/Group: Individual Therapy  [name] 02/14/2018, 1:46 PM

## 2018-02-14 NOTE — Progress Notes (Signed)
Physical Therapy Session Note  Patient Details  Name: Brandon [name] MRN: [id number] Date of Birth: 1940-10-20  Today's Date: 02/14/2018 PT Individual Time: 1500-1530 PT Individual Time Calculation (min): 30 min   Short Term Goals: Week 4:  PT Short Term Goal 1 (Week 4): Pt will maintain arousal for 3 minutes with min cues.  PT Short Term Goal 2 (Week 4): Pt will roll L and R in bed using UE hooking with consistent mod assist.  PT Short Term Goal 3 (Week 4): Pt will tolerate out of bed at 30 degrees of tilt in chair without orthostatic change in blood pressure from supine.    Skilled Therapeutic Interventions/Progress Updates:    no c/o pain this session.  Pt more alert and engaged, though continues with confused conversation throughout session.  Focus on increased pt independence with rolling, and transfers.  Pt requesting urinal, but again noted to be incontinent of foul smelling urine.  Rolling L and R with min assist to initiate UE hooking on bed rails.  Total assist for clothing management, peri-care, and donning clean brief.  BP 116/84 Supine>sit with total assist to bring trunk forward, with pt demonstrating a strong posterior lean this session.  +2 for slide board transfer to w/c.  Pt does not appear to initiate for scooting or forward lean this session. Attention to task during UB dressing from w/c with max multimodal cues to doff gown, assist to thread LUE and pull shirt over trunk, and max cues to stay attended to task.  Pt BP at end of session 84/62, pt still alert. Tilted back in w/c for pressure relief and BP management.  Call bell in reach and chair alarm activated.   Therapy Documentation Precautions:  Precautions Precautions: Fall, Cervical Required Braces or Orthoses: Cervical Brace Cervical Brace: Hard collar, Other (comment)(Don/doff in sitting. Ok to doff in bed, when ambulating to bathroom, and when showering per MD order) Restrictions Weight Bearing Restrictions:  No   See Function Navigator for Current Functional Status.   Therapy/Group: Individual Therapy  Stephania Fragmin 02/14/2018, 9:55 PM
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
## 2018-02-14 NOTE — Progress Notes (Signed)
Speech Language Pathology Daily Session Note  Patient Details  Name: Brandon [name] MRN: [id number] Date of Birth: 1940/06/14  Today's Date: 02/14/2018 SLP Individual Time: 0945-1010 SLP Individual Time Calculation (min): 25 min  Short Term Goals: Week 4: SLP Short Term Goal 1 (Week 4): Pt will sustain attention to basic familiar tasks for ~ 5 minutes with Max A cues.  SLP Short Term Goal 2 (Week 4): Pt will communicate intelligible basic wants and needs with Mod A.  SLP Short Term Goal 3 (Week 4): Given 2 choices related to orientation information, pt will select correct choice in 8 of 10 opportunities with Max A cues.   Skilled Therapeutic Interventions:Skilled ST services focused on cognitive skills. Pt was asleep upon entering room and requiring max A verbal cues for sustained attention for 1 minutes with increase endurance up to 2-3 minutes with max A verbal cthroughhout session. SLP communicated with nursing staff, reported poor sleep pattern last night. Pt demonstrated reduced speech intelligibility and unable to compensate due to cognitive deficit. SLp facilitated orientation, pt required mod A binary choices fo 70% accuracy. SLP facilitated sustained attention skills utilizing picture description cards, pt demonstrated 3 minutes sustained attention with max-mod A verbal before falling asleep. SLP attempted to wake pt, however pt became agitated and requested therapy to end. Treatment session was ended early due to fatigue and limited partcipation. Pt was left in room with call bell within reach and bed alaram set.ST reccomends to continue skilled ST services.       Function:  Eating Eating                 Cognition Comprehension Comprehension assist level: Understands basic 25 - 49% of the time/ requires cueing 50 - 75% of the time  Expression   Expression assist level: Expresses basic 25 - 49% of the time/requires cueing 50 - 75% of the time. Uses single words/gestures.   Social Interaction Social Interaction assist level: Interacts appropriately 25 - 49% of time - Needs frequent redirection.  Problem Solving Problem solving assist level: Solves basic less than 25% of the time - needs direction nearly all the time or does not effectively solve problems and may need a restraint for safety  Memory Memory assist level: Recognizes or recalls less than 25% of the time/requires cueing greater than 75% of the time    Pain Pain Assessment Pain Score: 0-No pain  Therapy/Group: Individual Therapy  Cyrstal Leitz  [facility] 02/14/2018, 12:25 PM

## 2018-02-15 ENCOUNTER — Inpatient Hospital Stay (HOSPITAL_COMMUNITY): Payer: Medicare Other | Admitting: Speech Pathology

## 2018-02-15 ENCOUNTER — Inpatient Hospital Stay (HOSPITAL_COMMUNITY): Payer: Medicare Other | Admitting: Physical Therapy

## 2018-02-15 ENCOUNTER — Inpatient Hospital Stay (HOSPITAL_COMMUNITY): Payer: Medicare Other | Admitting: Occupational Therapy

## 2018-02-15 LAB — CBC
HCT: 36.3 % — ABNORMAL LOW (ref 39.0–52.0)
Hemoglobin: 12.3 g/dL — ABNORMAL LOW (ref 13.0–17.0)
MCH: 32.2 pg (ref 26.0–34.0)
MCHC: 33.9 g/dL (ref 30.0–36.0)
MCV: 95 fL (ref 78.0–100.0)
PLATELETS: 215 10*3/uL (ref 150–400)
RBC: 3.82 MIL/uL — AB (ref 4.22–5.81)
RDW: 13 % (ref 11.5–15.5)
WBC: 5.9 10*3/uL (ref 4.0–10.5)

## 2018-02-15 LAB — PROTIME-INR
INR: 2.85
PROTHROMBIN TIME: 29.7 s — AB (ref 11.4–15.2)

## 2018-02-15 MED ORDER — WARFARIN SODIUM 2 MG PO TABS
2.0000 mg | ORAL_TABLET | Freq: Once | ORAL | Status: AC
  Administered 2018-02-15: 2 mg via ORAL
  Filled 2018-02-15: qty 1

## 2018-02-15 NOTE — Progress Notes (Signed)
Physical Therapy Session Note  Patient Details  Name: Brandon [name] MRN: [id number] Date of Birth: 1940-10-21  Today's Date: 02/15/2018 PT Individual Time: 1000-1045 PT Individual Time Calculation (min): 45 min   Short Term Goals: Week 4:  PT Short Term Goal 1 (Week 4): Pt will maintain arousal for 3 minutes with min cues.  PT Short Term Goal 2 (Week 4): Pt will roll L and R in bed using UE hooking with consistent mod assist.  PT Short Term Goal 3 (Week 4): Pt will tolerate out of bed at 30 degrees of tilt in chair without orthostatic change in blood pressure from supine.    Skilled Therapeutic Interventions/Progress Updates:    Patient received in bed, awake but speaking enthusiastically to PT, unable to follow patient's story as he speaks very quickly and tends to mumble this morning. He required TotalA for application of TEDS and Ace wraps, was already in abdominal binder. Performed rolling in bed for change of brief and application of pajama pants with MaxA, totalA required for donning clothing. Able to perform supine to sit with MaxAx2 and use of bed features, and sliding board transfer with totalAx2 to [facility] today.   BP was measured during session as is as follows:  Supine 150/104 Sitting initially 115/83 Sitting after 3-5 minutes 106/82 Sitting in WC at EOS 100/82 (appeared to stabilize, patient asymptomatic)  He was left up in the chair with all needs met, alarm activated this morning.   Therapy Documentation Precautions:  Precautions Precautions: Fall, Cervical Required Braces or Orthoses: Cervical Brace Cervical Brace: Hard collar, Other (comment)(Don/doff in sitting. Ok to doff in bed, when ambulating to bathroom, and when showering per MD order) Restrictions Weight Bearing Restrictions: No General:   Vital Signs: Therapy Vitals BP: (!) 120/94 Pain: Pain Assessment Pain Scale: 0-10 Pain Score: 0-No pain   See Function Navigator for Current Functional  Status.   Therapy/Group: Individual Therapy   Deniece Ree PT, DPT, CBIS  Supplemental Physical Therapist [facility]    Pager [phone] Acute Rehab Office [phone]    02/15/2018, 12:21 PM

## 2018-02-15 NOTE — Progress Notes (Signed)
Physical Therapy Session Note  Patient Details  Name: Brandon [name] MRN: [id number] Date of Birth: 1940/11/25  Today's Date: 02/15/2018 PT Individual Time: 1330-1430 PT Individual Time Calculation (min): 60 min   Short Term Goals: Week 4:  PT Short Term Goal 1 (Week 4): Pt will maintain arousal for 3 minutes with min cues.  PT Short Term Goal 2 (Week 4): Pt will roll L and R in bed using UE hooking with consistent mod assist.  PT Short Term Goal 3 (Week 4): Pt will tolerate out of bed at 30 degrees of tilt in chair without orthostatic change in blood pressure from supine.    Skilled Therapeutic Interventions/Progress Updates:    handoff from OT, no c/o pain but reporting need to have a BM.  Pt initially stating he would like to sit on the commode with his crossword to try and go.  Education and orientation provided to remind pt of current location and situation and pt simply states, "oh right."  Transition to bed with total +2 assist for slide board transfer, pt does not appear to assist with scooting, power up, or weight shift forward this session.  SIt>supine +2 assist to bring LEs and pivot shoulders into bed.  Rolling L and R throughout session for positioning of bed pan (pt with + void, but no BM), clothing management, hygiene, and positioning of draw sheet.  Pt initially mod assist to roll, progress to min assist to initiate pelvic rotation.  Pt becoming progressively fatigued in bed.  PT re-wrapped ace wraps and pt sleeping soundly by end of session.  Positioned upright in bed for upright tolerance and BP.  Call bell in reach and needs met.   Therapy Documentation Precautions:  Precautions Precautions: Fall, Cervical Required Braces or Orthoses: Cervical Brace Cervical Brace: Hard collar, Other (comment)(Don/doff in sitting. Ok to doff in bed, when ambulating to bathroom, and when showering per MD order) Restrictions Weight Bearing Restrictions: No   See Function Navigator for  Current Functional Status.   Therapy/Group: Individual Therapy  [name] 02/15/2018, 2:27 PM
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
## 2018-02-15 NOTE — Progress Notes (Signed)
ANTICOAGULATION CONSULT NOTE - Follow Up Consult  Pharmacy Consult for warfarin Indication: atrial fibrillation  Allergies  Allergen Reactions  . Flecainide Palpitations and Other (See Comments)    TACHYCARDIA SYNCOPE > LOC  . Sulfamethoxazole Other (See Comments)    Internal bleeding    Patient Measurements: Height: 6\' 1"  (185.4 cm) Weight: 185 lb 10 oz (84.2 kg) IBW/kg (Calculated) : 79.9  Vital Signs: Temp: 97.8 F (36.6 C) (09/12 0524) Temp Source: Axillary (09/12 0524) BP: 120/94 (09/12 0828) Pulse Rate: 81 (09/12 0524)  Labs: Recent Labs    02/13/18 0732 02/14/18 0507 02/15/18 0601  HGB 12.6* 12.9* 12.3*  HCT 37.7* 38.0* 36.3*  PLT 231 215 215  LABPROT 28.8* 28.3* 29.7*  INR 2.75 2.68 2.85    Estimated Creatinine Clearance: 90.2 mL/min (by C-G formula based on SCr of 0.68 mg/dL).   Medical History: Past Medical History:  Diagnosis Date  . Atrial fibrillation (HCC)     Medications:  Medications Prior to Admission  Medication Sig Dispense Refill Last Dose  . escitalopram (LEXAPRO) 10 MG tablet Take 10 mg by mouth daily.  0 01/15/2018 at Unknown time  . lisinopril (PRINIVIL,ZESTRIL) 20 MG tablet Take 20 mg by mouth daily.  2 01/15/2018 at Unknown time  . metoprolol tartrate (LOPRESSOR) 50 MG tablet Take 75 mg by mouth daily.   0 01/15/2018 at Unknown time  . tamsulosin (FLOMAX) 0.4 MG CAPS capsule Take 0.4 mg by mouth daily with supper.  2 01/15/2018 at Unknown time  . warfarin (COUMADIN) 5 MG tablet Take 2.5-5 mg by mouth See admin instructions. Take 5 mg on Tues, Thurs. Saturday Take 2.5 mg on Mon. Wed. Fri. And Sunday Take in the Morning  0 01/15/2018 at Unknown time    Assessment: 77 y/o male on Coumadin 2.5mg  daily except for 5mg  on TTSat PTA for Afib. INR was 2.66 on admit. Kcentra given due to epidural hematoma. Restarting Coumadin on 8/19 with no bridge. 9/6 INR jumped 2.28>3.32 and dose was held.  INR is therapeutic today at 2.85. Patient's intake  is still poor at 30%, which will effect coumadin dosing.   Goal of Therapy:  INR 2-3 Monitor platelets by anticoagulation protocol: Yes   Plan:  Coumadin 2mg  PO today Will continue to monitor daily INR, CBC, s/sx of bleeding, and PO intake  Ulyses SouthwardMinh Rhenda [place], PharmD, BedfordBCIDP, AAHIVP, CPP Infectious Disease Pharmacist Pager: [phone] 02/15/2018 9:26 AM
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
## 2018-02-15 NOTE — Progress Notes (Signed)
Speech Language Pathology Weekly Progress and Session Note  Patient Details  Name: Brandon [name] MRN: [id number] Date of Birth: Oct 22, 1940  Beginning of progress report period: February 09, 2018 End of progress report period: February 16, 2018  Today's Date: 02/15/2018 SLP Individual Time: 4098-1191 SLP Individual Time Calculation (min): 18 min  Short Term Goals: Week 4: SLP Short Term Goal 1 (Week 4): Pt will sustain attention to basic familiar tasks for ~ 5 minutes with Max A cues.  SLP Short Term Goal 1 - Progress (Week 4): Not met SLP Short Term Goal 2 (Week 4): Pt will communicate intelligible basic wants and needs with Mod A.  SLP Short Term Goal 2 - Progress (Week 4): Not met SLP Short Term Goal 3 (Week 4): Given 2 choices related to orientation information, pt will select correct choice in 8 of 10 opportunities with Max A cues.  SLP Short Term Goal 3 - Progress (Week 4): Not met    New Short Term Goals: Week 5: SLP Short Term Goal 1 (Week 5): Pt will sustain attention to basic familiar tasks for ~ 5 minutes with Max A cues.  SLP Short Term Goal 2 (Week 5): Pt will communicate intelligible basic wants and needs with Mod A.  SLP Short Term Goal 3 (Week 5): Given 2 choices related to orientation information, pt will select correct choice in 8 of 10 opportunities with Max A cues.   Weekly Progress Updates: Although pt has been able to demonstrate mildly increased participation and alertness in several sessions this reporting period, he continues to require overall Total A with all cognitive functions. Therefore he has not met any of his STGs. Skilled ST continues to be required to facilitate increasing attention to task, communication of basic wants and needs as well as orientation information to increase pt's functional independence at SNF. Anticipate that pt will continue to require skilled ST at SNF placement once medically able to discharge from CIR.      Intensity: Minumum of  1-2 x/day, 30 to 90 minutes Frequency: 3 to 5 out of 7 days Duration/Length of Stay: TBD determined Treatment/Interventions: Cognitive remediation/compensation;Cueing hierarchy;Internal/external aids;Patient/family education;Functional tasks   Daily Session  Skilled Therapeutic Interventions: Skilled treatment session focused on sustaining arousal for ~ 3 minutes with Max A cues, Max A cues from choice of 2 for orientation information and pt with decreased confusion this session. He continues to be very fatigued and missed 12 minutes of session. Pt left in bed asleep with all needs within reach.      Function:     Cognition Comprehension Comprehension assist level: Understands basic less than 25% of the time/ requires cueing >75% of the time;Understands basic 25 - 49% of the time/ requires cueing 50 - 75% of the time  Expression   Expression assist level: Expresses basic 25 - 49% of the time/requires cueing 50 - 75% of the time. Uses single words/gestures.;Expresses basis less than 25% of the time/requires cueing >75% of the time.  Social Interaction Social Interaction assist level: Interacts appropriately 25 - 49% of time - Needs frequent redirection.;Interacts appropriately less than 25% of the time. May be withdrawn or combative.  Problem Solving Problem solving assist level: Solves basic less than 25% of the time - needs direction nearly all the time or does not effectively solve problems and may need a restraint for safety  Memory Memory assist level: Recognizes or recalls less than 25% of the time/requires cueing greater than 75% of the time;Recognizes  or recalls 25 - 49% of the time/requires cueing 50 - 75% of the time   General    Pain    Therapy/Group: Individual Therapy  Mona Ayars 02/15/2018, 9:27 AM

## 2018-02-16 ENCOUNTER — Inpatient Hospital Stay (HOSPITAL_COMMUNITY): Payer: Medicare Other | Admitting: Speech Pathology

## 2018-02-16 ENCOUNTER — Inpatient Hospital Stay (HOSPITAL_COMMUNITY): Payer: Medicare Other | Admitting: Physical Therapy

## 2018-02-16 ENCOUNTER — Inpatient Hospital Stay (HOSPITAL_COMMUNITY): Payer: Medicare Other | Admitting: Occupational Therapy

## 2018-02-16 LAB — BASIC METABOLIC PANEL
ANION GAP: 9 (ref 5–15)
BUN: 17 mg/dL (ref 8–23)
CALCIUM: 8.7 mg/dL — AB (ref 8.9–10.3)
CO2: 27 mmol/L (ref 22–32)
Chloride: 103 mmol/L (ref 98–111)
Creatinine, Ser: 0.66 mg/dL (ref 0.61–1.24)
GLUCOSE: 102 mg/dL — AB (ref 70–99)
Potassium: 3.2 mmol/L — ABNORMAL LOW (ref 3.5–5.1)
SODIUM: 139 mmol/L (ref 135–145)

## 2018-02-16 LAB — CBC
HCT: 36 % — ABNORMAL LOW (ref 39.0–52.0)
HEMOGLOBIN: 12.2 g/dL — AB (ref 13.0–17.0)
MCH: 32.4 pg (ref 26.0–34.0)
MCHC: 33.9 g/dL (ref 30.0–36.0)
MCV: 95.5 fL (ref 78.0–100.0)
PLATELETS: 196 10*3/uL (ref 150–400)
RBC: 3.77 MIL/uL — ABNORMAL LOW (ref 4.22–5.81)
RDW: 12.9 % (ref 11.5–15.5)
WBC: 5 10*3/uL (ref 4.0–10.5)

## 2018-02-16 LAB — PREALBUMIN: PREALBUMIN: 20.4 mg/dL (ref 18–38)

## 2018-02-16 LAB — PROTIME-INR
INR: 2.63
PROTHROMBIN TIME: 27.8 s — AB (ref 11.4–15.2)

## 2018-02-16 MED ORDER — POTASSIUM CHLORIDE CRYS ER 20 MEQ PO TBCR
20.0000 meq | EXTENDED_RELEASE_TABLET | Freq: Every day | ORAL | Status: DC
  Administered 2018-02-16 – 2018-02-26 (×10): 20 meq via ORAL
  Filled 2018-02-16 (×11): qty 1

## 2018-02-16 MED ORDER — WARFARIN SODIUM 2 MG PO TABS
2.0000 mg | ORAL_TABLET | Freq: Every day | ORAL | Status: DC
  Administered 2018-02-16 – 2018-02-25 (×10): 2 mg via ORAL
  Filled 2018-02-16 (×10): qty 1

## 2018-02-16 NOTE — Progress Notes (Signed)
Social Work Patient ID: Brandon [name], male   DOB: December 16, 1940, 77 y.o.   MRN: [id number]   CSW spoke with pt's dtr 02-13-18 to update her on team conference discussion and to assure that she had spoke with pt's nurse and had all of her questions answered.  Pt's fiance telling dtr things she was concerned about and dtr wanted to clarify care her father is receiving.  CSW offered for dtr to speak with Nursing Director, but she was satisfied with information given by CSW and LPN at this time.  CSW did alert Cheri GuppyJennifer Bailey, Nursing Director,situation per dtr's request.  Dtr continues to plans to have pt go to SNF after CIR and reports being pleased with pt's care.  CSW will continue to follow and assist as needed and facilitate SNF tx once medically stable.

## 2018-02-16 NOTE — Progress Notes (Signed)
Physical Therapy Session Note  Patient Details  Name: Brandon [name] MRN: [id number] Date of Birth: 1941/04/01  Today's Date: 02/16/2018 PT Individual Time: 1100-1200 PT Individual Time Calculation (min): 60 min   Short Term Goals: Week 4:  PT Short Term Goal 1 (Week 4): Pt will maintain arousal for 3 minutes with min cues.  PT Short Term Goal 2 (Week 4): Pt will roll L and R in bed using UE hooking with consistent mod assist.  PT Short Term Goal 3 (Week 4): Pt will tolerate out of bed at 30 degrees of tilt in chair without orthostatic change in blood pressure from supine.    Skilled Therapeutic Interventions/Progress Updates:    pt sleeping soundly on arrival, no c/o pain, but difficult to maintain arousal throughout session. BP in RUE and LUE WNL when assessed in sitting.  Session focus on arousal and attention to task, increased independence with functional bed mobility and transfers, and sitting balance.  Pt does not give any effort this session for transitional movements or transfers.    Pt incontinent of urine on PT arrival, requires max assist to roll R and L this session 2/2 lethargy and total assist for hygiene and clothing management.  Dependent for supine>sitting EOB and total +2 assist for slide board and squat/pivot transfers this session.  Pt does not assist at all even with increased time to process/initiate movement.    Sitting balance edge of therapy mat with intermittent min<>mod assist and short bouts of close supervision with pt BUE support.  Transition to dynamic sitting balance focus on forward reach to knock ball from therapy tech's hands.  Pt c/o pain in L shoulder with this activity after 5 trials.  Returned to w/c and PT applied kinesiotape to L shoulder to support sublux and reduce pain.  Pt returned to nursing station in tilt in space at end of session with chair alarm intact.   Therapy Documentation Precautions:  Precautions Precautions: Fall, Cervical Required  Braces or Orthoses: Cervical Brace Cervical Brace: Hard collar, Other (comment)(Don/doff in sitting. Ok to doff in bed, when ambulating to bathroom, and when showering per MD order) Restrictions Weight Bearing Restrictions: No   See Function Navigator for Current Functional Status.   Therapy/Group: Individual Therapy  Stephania FragminCaitlin E Lavontae Cornia 02/16/2018, 1:03 PM

## 2018-02-16 NOTE — Progress Notes (Signed)
ANTICOAGULATION CONSULT NOTE - Follow Up Consult  Pharmacy Consult for warfarin Indication: atrial fibrillation  Allergies  Allergen Reactions  . Flecainide Palpitations and Other (See Comments)    TACHYCARDIA SYNCOPE > LOC  . Sulfamethoxazole Other (See Comments)    Internal bleeding    Patient Measurements: Height: 6\' 1"  (185.4 cm) Weight: 185 lb 10 oz (84.2 kg) IBW/kg (Calculated) : 79.9  Vital Signs: Temp: 97.8 F (36.6 C) (09/13 0509) BP: 132/85 (09/13 0509) Pulse Rate: 77 (09/13 0509)  Labs: Recent Labs    02/14/18 0507 02/15/18 0601 02/16/18 0653  HGB 12.9* 12.3* 12.2*  HCT 38.0* 36.3* 36.0*  PLT 215 215 196  LABPROT 28.3* 29.7* 27.8*  INR 2.68 2.85 2.63  CREATININE  --   --  0.66    Estimated Creatinine Clearance: 90.2 mL/min (by C-G formula based on SCr of 0.66 mg/dL).   Medical History: Past Medical History:  Diagnosis Date  . Atrial fibrillation (HCC)     Medications:  Medications Prior to Admission  Medication Sig Dispense Refill Last Dose  . escitalopram (LEXAPRO) 10 MG tablet Take 10 mg by mouth daily.  0 01/15/2018 at Unknown time  . lisinopril (PRINIVIL,ZESTRIL) 20 MG tablet Take 20 mg by mouth daily.  2 01/15/2018 at Unknown time  . metoprolol tartrate (LOPRESSOR) 50 MG tablet Take 75 mg by mouth daily.   0 01/15/2018 at Unknown time  . tamsulosin (FLOMAX) 0.4 MG CAPS capsule Take 0.4 mg by mouth daily with supper.  2 01/15/2018 at Unknown time  . warfarin (COUMADIN) 5 MG tablet Take 2.5-5 mg by mouth See admin instructions. Take 5 mg on Tues, Thurs. Saturday Take 2.5 mg on Mon. Wed. Fri. And Sunday Take in the Morning  0 01/15/2018 at Unknown time    Assessment: 77 y/o male on Coumadin 2.5mg  daily except for 5mg  on TTSat PTA for Afib. INR was 2.66 on admit. Kcentra given due to epidural hematoma. Restarting Coumadin on 8/19 with no bridge. 9/6 INR jumped 2.28>3.32 and dose was held.  INR is therapeutic for the past 3 days now. We will try a  daily dose. Patient's intake is still poor at 30%, which will effect coumadin dosing.   Goal of Therapy:  INR 2-3 Monitor platelets by anticoagulation protocol: Yes   Plan:  Coumadin 2mg  PO qday Will continue to monitor daily INR, CBC, s/sx of bleeding, and PO intake  Ulyses SouthwardMinh Brooklynne Pereida, PharmD, HamburgBCIDP, AAHIVP, CPP Infectious Disease Pharmacist Pager: [phone] 02/16/2018 9:32 AM

## 2018-02-16 NOTE — Progress Notes (Signed)
Speech Language Pathology Daily Session Note  Patient Details  Name: Laray AngerMichael Whitehair MRN: [id number] Date of Birth: Jul 28, 1940  Today's Date: 02/16/2018 SLP Individual Time: 7829-56210730-0815 SLP Individual Time Calculation (min): 45 min  Short Term Goals: Week 5: SLP Short Term Goal 1 (Week 5): Pt will sustain attention to basic familiar tasks for ~ 5 minutes with Max A cues.  SLP Short Term Goal 2 (Week 5): Pt will communicate intelligible basic wants and needs with Mod A.  SLP Short Term Goal 3 (Week 5): Given 2 choices related to orientation information, pt will select correct choice in 8 of 10 opportunities with Max A cues.   Skilled Therapeutic Interventions: Skilled treatment session focused on cognition goals. SLP facilitated functional cognition by providing verbal cues to transfer out of bed via hoyer. With continued Total A to Max A cues pt able to initiate some movement/possible comprehension of bed mobility task in ~ < 25% of opportunity. Pt required Total A and over hand A for cognitive perception that cup was in hand and able to consume ~ 4 oz thin liquids before confusion increased. Pt with intermittent sleeping and required Total Cues for alertness. Blood pressure while up in wheelchair was 116/90.      Function:  Eating Eating   Modified Consistency Diet: Yes Eating Assist Level: Set up assist for;Helper scoops food on utensil     Helper Scoops Food on Utensil: Every scoop Helper Brings Food to Mouth: Every scoop   Cognition Comprehension Comprehension assist level: Understands basic 25 - 49% of the time/ requires cueing 50 - 75% of the time;Understands basic less than 25% of the time/ requires cueing >75% of the time  Expression   Expression assist level: Expresses basis less than 25% of the time/requires cueing >75% of the time.;Expresses basic 25 - 49% of the time/requires cueing 50 - 75% of the time. Uses single words/gestures.  Social Interaction Social Interaction assist  level: Interacts appropriately 25 - 49% of time - Needs frequent redirection.;Interacts appropriately less than 25% of the time. May be withdrawn or combative.  Problem Solving Problem solving assist level: Solves basic less than 25% of the time - needs direction nearly all the time or does not effectively solve problems and may need a restraint for safety  Memory Memory assist level: Recognizes or recalls less than 25% of the time/requires cueing greater than 75% of the time;Recognizes or recalls 25 - 49% of the time/requires cueing 50 - 75% of the time    Pain    Therapy/Group: Individual Therapy  Ronica Vivian 02/16/2018, 10:36 AM

## 2018-02-16 NOTE — Progress Notes (Signed)
Occupational Therapy Session Note  Patient Details  Name: Brandon [name] MRN: [id number] Date of Birth: [birth year]  Today's Date: 02/16/2018 OT Individual Time: [id number] OT Individual Time Calculation (min): 56 min   Short Term Goals: Week 3:  OT Short Term Goal 1 (Week 3): STGS=LTGs secondary to upcoming discharge  Skilled Therapeutic Interventions/Progress Updates:    Pt greeted in TIS with LEs elevated, lethargic, but no c/o pain. BP 125/88. Escorted pt to gym and worked on trunk control and NMR edge of mat. 2 helpers for slideboard<mat with pt able to weight shift forward with cuing. Positioned pt in long sitting/figure 4 positions while focusing on anterior weight shift/core strengthening and following 1 step instruction. He exhibited significant posterior lean and required Mod-Max A for transitioning from semi reclined position to upright. Pt actively participating at this time. For one window, pt was able to maintain long sitting unsupported with close supervision for 40 seconds! He relied heavily on UEs to initiate and maintain all forward weight shifts. Weightbearing facilitated via lateral leaning on elbows with visible bicep activation L UE. During slideboard transfer back to w/c, pt able to push through arms and lean forward with instruction with assist of 2. He was repositioned in w/c and returned to room. Pt left with soft call bell attached to abdominal binder. Fiance present. Notified RN and NT to assist pt back to bed for brief change.   BP monitored throughout session with no s/s orthostatics: On mat in long sitting: 101/88.  On mat in circle sitting: 107/77 Post transfer back to w/c 123/81  Pt conversational and alert during tx today    Therapy Documentation Precautions:  Precautions Precautions: Fall, Cervical Required Braces or Orthoses: Cervical Brace Cervical Brace: Hard collar, Other (comment)(Don/doff in sitting. Ok to doff in bed, when ambulating to bathroom, and  when showering per MD order) Restrictions Weight Bearing Restrictions: No Vital Signs: Therapy Vitals BP: (!) 124/96 Patient Position (if appropriate): (tilt in space) Pain: No c/o pain    ADL: ADL ADL Comments: Please see functional navigator for ADL status    See Function Navigator for Current Functional Status.   Therapy/Group: Individual Therapy  Brandon [name] 02/16/2018, 12:27 PM
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
## 2018-02-16 NOTE — Plan of Care (Signed)
  Problem: Consults Goal: RH SPINAL CORD INJURY PATIENT EDUCATION Description  See Patient Education module for education specifics.  Outcome: Progressing Goal: Skin Care Protocol Initiated - if Braden Score 18 or less Description If consults are not indicated, leave blank or document N/A Outcome: Progressing   Problem: SCI BOWEL ELIMINATION Goal: RH STG MANAGE BOWEL WITH ASSISTANCE Description STG Manage Bowel with mod Assistance.  Outcome: Progressing Goal: RH STG SCI MANAGE BOWEL PROGRAM W/ASSIST OR AS APPROPRIATE Description STG SCI Manage bowel program w/mod assist or as appropriate.  Outcome: Progressing   Problem: SCI BLADDER ELIMINATION Goal: RH STG MANAGE BLADDER WITH ASSISTANCE Description STG Manage Bladder With mod Assistance  Outcome: Progressing Goal: RH STG MANAGE BLADDER WITH EQUIPMENT WITH ASSISTANCE Description STG Manage Bladder With Equipment With mod Assistance  Outcome: Progressing Goal: RH STG SCI MANAGE BLADDER PROGRAM W/ASSISTANCE Description Mod assist  Outcome: Progressing   Problem: RH SKIN INTEGRITY Goal: RH STG SKIN FREE OF INFECTION/BREAKDOWN Description Patients skin will remain free from further infection or breakdown with mod assist.  Outcome: Progressing Goal: RH STG MAINTAIN SKIN INTEGRITY WITH ASSISTANCE Description STG Maintain Skin Integrity With mod Assistance.  Outcome: Progressing Goal: RH STG ABLE TO PERFORM INCISION/WOUND CARE W/ASSISTANCE Description STG Able To Perform Incision/Wound Care With total Assistance from caregiver .  Outcome: Progressing   Problem: RH SAFETY Goal: RH STG ADHERE TO SAFETY PRECAUTIONS W/ASSISTANCE/DEVICE Description STG Adhere to Safety Precautions With mod Assistance/Device.  Outcome: Progressing   Problem: RH PAIN MANAGEMENT Goal: RH STG PAIN MANAGED AT OR BELOW PT'S PAIN GOAL Description < 4  Outcome: Progressing   Problem: RH KNOWLEDGE DEFICIT SCI Goal: RH STG INCREASE KNOWLEDGE  OF SELF CARE AFTER SCI Description Mod assist  Outcome: Progressing   

## 2018-02-16 NOTE — Progress Notes (Signed)
Speech Language Pathology Daily Session Note  Patient Details  Name: Brandon [name] MRN: [id number] Date of Birth: 1941/05/19  Today's Date: 02/16/2018 SLP Individual Time: 1510-1545 SLP Individual Time Calculation (min): 35 min  Short Term Goals: Week 5: SLP Short Term Goal 1 (Week 5): Pt will sustain attention to basic familiar tasks for ~ 5 minutes with Max A cues.  SLP Short Term Goal 2 (Week 5): Pt will communicate intelligible basic wants and needs with Mod A.  SLP Short Term Goal 3 (Week 5): Given 2 choices related to orientation information, pt will select correct choice in 8 of 10 opportunities with Max A cues.   Skilled Therapeutic Interventions:  Pt was seen for skilled ST targeting cognitive goals.  Pt was soundly asleep upon therapist's arrival and did not respond to loud voice or sternal rub but was briskly awakened by cold compress on the forehead, stating "That feels wonderful." Pt with noted dry skin at the corners of his mouth and halitosis.  He was agreeable to allowing therapist to complete oral care and even initiated a request for a toothpick to get something out from between his teeth.  After oral care, pt was alert and pleasantly conversant but confused.  Pt was oriented to place and situation with min question cues but could not recall any general information about his daily therapy schedule.  Pt also recognized his daughter, son in law, and grandchildren from photographs but did not recognize his dog (even though he could describe his dog accurately and recall his name).  Pt's responses to therapist's questions were at times appropriate for 1-2 conversational exchanges but quickly became tangential and not pertinent to the topic at hand despite max cues for redirection.  Pt requested to use the urinal as therapist was departing and was able to void a trace amount of urine.  Pt was left in bed with all needs within reach.  SLP reviewed use of call bell to communicate with  nursing.  Continue per current plan of care.    Function:  Eating Eating                 Cognition Comprehension Comprehension assist level: Understands basic 25 - 49% of the time/ requires cueing 50 - 75% of the time  Expression   Expression assist level: Expresses basic 25 - 49% of the time/requires cueing 50 - 75% of the time. Uses single words/gestures.  Social Interaction Social Interaction assist level: Interacts appropriately 25 - 49% of time - Needs frequent redirection.  Problem Solving Problem solving assist level: Solves basic less than 25% of the time - needs direction nearly all the time or does not effectively solve problems and may need a restraint for safety  Memory Memory assist level: Recognizes or recalls less than 25% of the time/requires cueing greater than 75% of the time    Pain Pain Assessment Pain Scale: 0-10 Pain Score: 0-No pain  Therapy/Group: Individual Therapy  Ieshia Hatcher, Melanee SpryNicole L 02/16/2018, 4:32 PM
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
## 2018-02-17 ENCOUNTER — Inpatient Hospital Stay (HOSPITAL_COMMUNITY): Payer: Medicare Other | Admitting: Physical Therapy

## 2018-02-17 ENCOUNTER — Inpatient Hospital Stay (HOSPITAL_COMMUNITY): Payer: Medicare Other

## 2018-02-17 DIAGNOSIS — D62 Acute posthemorrhagic anemia: Secondary | ICD-10-CM

## 2018-02-17 DIAGNOSIS — E876 Hypokalemia: Secondary | ICD-10-CM

## 2018-02-17 LAB — PROTIME-INR
INR: 2.92
PROTHROMBIN TIME: 30.2 s — AB (ref 11.4–15.2)

## 2018-02-17 MED ORDER — BACLOFEN 5 MG HALF TABLET
5.0000 mg | ORAL_TABLET | Freq: Three times a day (TID) | ORAL | Status: DC
  Administered 2018-02-17 (×2): 5 mg via ORAL
  Filled 2018-02-17 (×3): qty 1

## 2018-02-17 NOTE — Progress Notes (Signed)
Physical Therapy Session Note  Patient Details  Name: Eli Pattillo MRN: [id number] Date of Birth: 08/04/1940  Today's Date: 02/17/2018 PT Individual Time: 1105-1201 PT Individual Time Calculation (min): 56 min   Short Term Goals: Week 4:  PT Short Term Goal 1 (Week 4): Pt will maintain arousal for 3 minutes with min cues.  PT Short Term Goal 2 (Week 4): Pt will roll L and R in bed using UE hooking with consistent mod assist.  PT Short Term Goal 3 (Week 4): Pt will tolerate out of bed at 30 degrees of tilt in chair without orthostatic change in blood pressure from supine.    Skilled Therapeutic Interventions/Progress Updates:   Pt received supine in bed and agreeable to PT. Supine>sit transfer with total assist assist and max cues for sequencing. SB transfer to [facility] with total assist from PT.    Eating in [place] with max assist for portion control. BP assessed in sitting while engaged in eating task. 0 min: 76/59. 3 min: 65/58. 6 min: 81/55.   Sitting balance with reaching task from WC 3 x 6 Bil to reach outside BOS(forward/laterall). Moderate cues for use of flexed elbow on WC arm rest to prevent LOB and return to neutral sitting position with mild LOB laterally.  BP assessed while performing reaching tasks 81/55 throughout.   Patient returned to RN station and left sitting in [facility] with call bell in reach and all needs met.        Therapy Documentation Precautions:  Precautions Precautions: Fall, Cervical Required Braces or Orthoses: Cervical Brace Cervical Brace: Hard collar, Other (comment)(Don/doff in sitting. Ok to doff in bed, when ambulating to bathroom, and when showering per MD order) Restrictions Weight Bearing Restrictions: No   Pain:   denies at rest.   See Function Navigator for Current Functional Status.   Therapy/Group: Individual Therapy  Lorie Phenix 02/17/2018, 1:08 PM

## 2018-02-17 NOTE — Progress Notes (Signed)
Occupational Therapy Session Note  Patient Details  Name: Brandon [name] MRN: [id number] Date of Birth: [birth year]  Today's Date: 02/17/2018 OT Individual Time: [id number] OT Individual Time Calculation (min): 71 min    Short Term Goals: Week 3:  OT Short Term Goal 1 (Week 3): STGS=LTGs secondary to upcoming discharge  Skilled Therapeutic Interventions/Progress Updates:    OT intervention with focus on orientation and bed mobility.  Pt c/o discomfort in bed upon arrival and report of "I think I have feces oozing out of me." Pt incontinent of bowel and bladder.  Pt engaged bed mobility with focus on task initiation and following one step commands.  Pt not oriented this morning and required max verbal cues throughout session to reorient to place and situation. Pt required max A for rolling R/L and tot A for hygiene. Pt BLE in flexed posture at knees and hips and gentle stretching provided during bed mobility activies.  Ted hose and Ace wraps applied and pt repositioned in bed with soft call bell placed within reach. Bed alarm activated.   Therapy Documentation Precautions:  Precautions Precautions: Fall, Cervical Required Braces or Orthoses: Cervical Brace Cervical Brace: Hard collar, Other (comment)(Don/doff in sitting. Ok to doff in bed, when ambulating to bathroom, and when showering per MD order) Restrictions Weight Bearing Restrictions: No  Pain:  Pt c/o generalized discomfort with position in bed; repositioned with pt report of relief  See Function Navigator for Current Functional Status.   Therapy/Group: Individual Therapy  Rich BraveLanier, Alia Parsley Chappell 02/17/2018, 9:27 AM

## 2018-02-17 NOTE — Plan of Care (Signed)
  Problem: Consults Goal: RH SPINAL CORD INJURY PATIENT EDUCATION Description  See Patient Education module for education specifics.  Outcome: Progressing Goal: Skin Care Protocol Initiated - if Braden Score 18 or less Description If consults are not indicated, leave blank or document N/A Outcome: Progressing   Problem: SCI BOWEL ELIMINATION Goal: RH STG MANAGE BOWEL WITH ASSISTANCE Description STG Manage Bowel with mod Assistance.  Outcome: Progressing Goal: RH STG SCI MANAGE BOWEL PROGRAM W/ASSIST OR AS APPROPRIATE Description STG SCI Manage bowel program w/mod assist or as appropriate.  Outcome: Progressing   Problem: SCI BLADDER ELIMINATION Goal: RH STG MANAGE BLADDER WITH ASSISTANCE Description STG Manage Bladder With mod Assistance  Outcome: Progressing Goal: RH STG MANAGE BLADDER WITH EQUIPMENT WITH ASSISTANCE Description STG Manage Bladder With Equipment With mod Assistance  Outcome: Progressing Goal: RH STG SCI MANAGE BLADDER PROGRAM W/ASSISTANCE Description Mod assist  Outcome: Progressing   Problem: RH SKIN INTEGRITY Goal: RH STG SKIN FREE OF INFECTION/BREAKDOWN Description Patients skin will remain free from further infection or breakdown with mod assist.  Outcome: Progressing Goal: RH STG MAINTAIN SKIN INTEGRITY WITH ASSISTANCE Description STG Maintain Skin Integrity With mod Assistance.  Outcome: Progressing Goal: RH STG ABLE TO PERFORM INCISION/WOUND CARE W/ASSISTANCE Description STG Able To Perform Incision/Wound Care With total Assistance from caregiver .  Outcome: Progressing   Problem: RH SAFETY Goal: RH STG ADHERE TO SAFETY PRECAUTIONS W/ASSISTANCE/DEVICE Description STG Adhere to Safety Precautions With mod Assistance/Device.  Outcome: Progressing   Problem: RH PAIN MANAGEMENT Goal: RH STG PAIN MANAGED AT OR BELOW PT'S PAIN GOAL Description < 4  Outcome: Progressing   Problem: RH KNOWLEDGE DEFICIT SCI Goal: RH STG INCREASE KNOWLEDGE  OF SELF CARE AFTER SCI Description Mod assist  Outcome: Progressing
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
## 2018-02-17 NOTE — Progress Notes (Signed)
ANTICOAGULATION CONSULT NOTE - Follow Up Consult  Pharmacy Consult for warfarin Indication: atrial fibrillation  Allergies  Allergen Reactions  . Flecainide Palpitations and Other (See Comments)    TACHYCARDIA SYNCOPE > LOC  . Sulfamethoxazole Other (See Comments)    Internal bleeding    Patient Measurements: Height: 6\' 1"  (185.4 cm) Weight: 185 lb 10 oz (84.2 kg) IBW/kg (Calculated) : 79.9  Vital Signs: Temp: 97.8 F (36.6 C) (09/14 0437) BP: 158/92 (09/14 0437) Pulse Rate: 74 (09/14 0437)  Labs: Recent Labs    02/15/18 0601 02/16/18 0653 02/17/18 0942  HGB 12.3* 12.2*  --   HCT 36.3* 36.0*  --   PLT 215 196  --   LABPROT 29.7* 27.8* 30.2*  INR 2.85 2.63 2.92  CREATININE  --  0.66  --     Estimated Creatinine Clearance: 90.2 mL/min (by C-G formula based on SCr of 0.66 mg/dL).   Medical History: Past Medical History:  Diagnosis Date  . Atrial fibrillation (HCC)     Medications:  Medications Prior to Admission  Medication Sig Dispense Refill Last Dose  . escitalopram (LEXAPRO) 10 MG tablet Take 10 mg by mouth daily.  0 01/15/2018 at Unknown time  . lisinopril (PRINIVIL,ZESTRIL) 20 MG tablet Take 20 mg by mouth daily.  2 01/15/2018 at Unknown time  . metoprolol tartrate (LOPRESSOR) 50 MG tablet Take 75 mg by mouth daily.   0 01/15/2018 at Unknown time  . tamsulosin (FLOMAX) 0.4 MG CAPS capsule Take 0.4 mg by mouth daily with supper.  2 01/15/2018 at Unknown time  . warfarin (COUMADIN) 5 MG tablet Take 2.5-5 mg by mouth See admin instructions. Take 5 mg on Tues, Thurs. Saturday Take 2.5 mg on Mon. Wed. Fri. And Sunday Take in the Morning  0 01/15/2018 at Unknown time    Assessment: 77 y/o male on Coumadin 2.5mg  daily except for 5mg  on TTSat PTA for Afib. INR was 2.66 on admit. Kcentra given due to epidural hematoma. Restarting Coumadin on 8/19 with no bridge. 9/6 INR jumped 2.28>3.32 and dose was held.  INR is therapeutic for the past 4 days now (2.92 today). We  will try a daily dose. Patient's intake is still poor at 30%, which will effect coumadin dosing.   Goal of Therapy:  INR 2-3 Monitor platelets by anticoagulation protocol: Yes   Plan:  Coumadin 2mg  PO qday Will continue to monitor daily INR, CBC, s/sx of bleeding, and PO intake  Ewing Schleinolton Whiteside, PharmD PGY1 Pharmacy Resident 02/17/2018    10:40 AM

## 2018-02-18 ENCOUNTER — Inpatient Hospital Stay (HOSPITAL_COMMUNITY): Payer: Medicare Other | Admitting: Occupational Therapy

## 2018-02-18 ENCOUNTER — Inpatient Hospital Stay (HOSPITAL_COMMUNITY): Payer: Medicare Other | Admitting: Physical Therapy

## 2018-02-18 DIAGNOSIS — R0989 Other specified symptoms and signs involving the circulatory and respiratory systems: Secondary | ICD-10-CM

## 2018-02-18 DIAGNOSIS — R5383 Other fatigue: Secondary | ICD-10-CM

## 2018-02-18 LAB — PROTIME-INR
INR: 2.87
PROTHROMBIN TIME: 29.8 s — AB (ref 11.4–15.2)

## 2018-02-18 LAB — CBC
HCT: 38.7 % — ABNORMAL LOW (ref 39.0–52.0)
HEMOGLOBIN: 12.8 g/dL — AB (ref 13.0–17.0)
MCH: 32 pg (ref 26.0–34.0)
MCHC: 33.1 g/dL (ref 30.0–36.0)
MCV: 96.8 fL (ref 78.0–100.0)
Platelets: 223 10*3/uL (ref 150–400)
RBC: 4 MIL/uL — AB (ref 4.22–5.81)
RDW: 13.1 % (ref 11.5–15.5)
WBC: 5.4 10*3/uL (ref 4.0–10.5)

## 2018-02-18 NOTE — Plan of Care (Signed)
Problem: Consults Goal: Skin Care Protocol Initiated - if Braden Score 18 or less Description If consults are not indicated, leave blank or document N/A 02/18/2018 1643 by Marylu Lund, LPN Outcome: Progressing 02/18/2018 1633 by Marylu Lund, LPN Outcome: Progressing 02/18/2018 1631 by Marylu Lund, LPN Outcome: Progressing 02/18/2018 1629 by Marylu Lund, LPN Outcome: Progressing   Problem: SCI BLADDER ELIMINATION Goal: RH STG MANAGE BLADDER WITH ASSISTANCE Description STG Manage Bladder With mod Assistance  02/18/2018 1643 by Marylu Lund, LPN Outcome: Progressing 02/18/2018 1633 by Marylu Lund, LPN Outcome: Progressing 02/18/2018 1631 by Marylu Lund, LPN Outcome: Progressing 02/18/2018 1629 by Marylu Lund, LPN Outcome: Progressing Goal: RH STG MANAGE BLADDER WITH EQUIPMENT WITH ASSISTANCE Description STG Manage Bladder With Equipment With mod Assistance  02/18/2018 1643 by Marylu Lund, LPN Outcome: Progressing 02/18/2018 1633 by Marylu Lund, LPN Outcome: Progressing 02/18/2018 1631 by Marylu Lund, LPN Outcome: Progressing 02/18/2018 1629 by Marylu Lund, LPN Outcome: Progressing   Problem: RH SKIN INTEGRITY Goal: RH STG SKIN FREE OF INFECTION/BREAKDOWN Description Patients skin will remain free from further infection or breakdown with mod assist.  02/18/2018 1643 by Marylu Lund, LPN Outcome: Progressing 02/18/2018 1633 by Marylu Lund, LPN Outcome: Progressing 02/18/2018 1631 by Marylu Lund, LPN Outcome: Progressing 02/18/2018 1629 by Marylu Lund, LPN Outcome: Progressing Goal: RH STG MAINTAIN SKIN INTEGRITY WITH ASSISTANCE Description STG Maintain Skin Integrity With mod Assistance.  02/18/2018 1643 by Marylu Lund, LPN Outcome: Progressing 02/18/2018 1633 by Marylu Lund, LPN Outcome: Progressing 02/18/2018 1631 by Marylu Lund, LPN Outcome: Progressing 02/18/2018 1629 by Marylu Lund, LPN Outcome:  Progressing   Problem: RH SAFETY Goal: RH STG ADHERE TO SAFETY PRECAUTIONS W/ASSISTANCE/DEVICE Description STG Adhere to Safety Precautions With mod Assistance/Device.  02/18/2018 1643 by Marylu Lund, LPN Outcome: Progressing 02/18/2018 1633 by Marylu Lund, LPN Outcome: Progressing 02/18/2018 1631 by Marylu Lund, LPN Outcome: Progressing 02/18/2018 1629 by Marylu Lund, LPN Outcome: Progressing   Problem: RH PAIN MANAGEMENT Goal: RH STG PAIN MANAGED AT OR BELOW PT'S PAIN GOAL Description < 4  02/18/2018 1643 by Marylu Lund, LPN Outcome: Progressing 02/18/2018 1633 by Marylu Lund, LPN Outcome: Progressing 02/18/2018 1631 by Marylu Lund, LPN Outcome: Progressing 02/18/2018 1629 by Marylu Lund, LPN Outcome: Progressing   Problem: Consults Goal: RH SPINAL CORD INJURY PATIENT EDUCATION Description  See Patient Education module for education specifics.  02/18/2018 1643 by Marylu Lund, LPN Outcome: Not Progressing 02/18/2018 1633 by Marylu Lund, LPN Outcome: Not Progressing 02/18/2018 1631 by Marylu Lund, LPN Outcome: Progressing 02/18/2018 1629 by Marylu Lund, LPN Outcome: Progressing   Problem: SCI BOWEL ELIMINATION Goal: RH STG MANAGE BOWEL WITH ASSISTANCE Description STG Manage Bowel with mod Assistance.  02/18/2018 1643 by Marylu Lund, LPN Outcome: Not Progressing 02/18/2018 1633 by Marylu Lund, LPN Outcome: Not Progressing 02/18/2018 1631 by Marylu Lund, LPN Outcome: Progressing 02/18/2018 1629 by Marylu Lund, LPN Outcome: Progressing Goal: RH STG SCI MANAGE BOWEL PROGRAM W/ASSIST OR AS APPROPRIATE Description STG SCI Manage bowel program w/mod assist or as appropriate.  02/18/2018 1643 by Marylu Lund, LPN Outcome: Not Progressing 02/18/2018 1633 by Marylu Lund, LPN Outcome: Not Progressing 02/18/2018 1631 by Marylu Lund, LPN Outcome: Progressing 02/18/2018 1629 by Marylu Lund, LPN Outcome:  Progressing   Problem: SCI BLADDER ELIMINATION Goal: RH STG SCI MANAGE BLADDER PROGRAM  W/ASSISTANCE Description Mod assist  02/18/2018 1643 by Marylu LundNida, Jaxsyn Azam W, LPN Outcome: Not Progressing 02/18/2018 1633 by Marylu LundNida, Jyquan Kenley W, LPN Outcome: Not Progressing 02/18/2018 1631 by Marylu LundNida, Nayson Traweek W, LPN Outcome: Progressing 02/18/2018 1629 by Marylu LundNida, Kearstin Learn W, LPN Outcome: Progressing   Problem: RH SKIN INTEGRITY Goal: RH STG ABLE TO PERFORM INCISION/WOUND CARE W/ASSISTANCE Description STG Able To Perform Incision/Wound Care With total Assistance from caregiver .  02/18/2018 1643 by Marylu LundNida, Shaniah Baltes W, LPN Outcome: Not Progressing 02/18/2018 1633 by Marylu LundNida, Lilianna Case W, LPN Outcome: Progressing 02/18/2018 1631 by Marylu LundNida, Keyara Ent W, LPN Outcome: Progressing 02/18/2018 1629 by Marylu LundNida, Kaylanie Capili W, LPN Outcome: Progressing   Problem: RH KNOWLEDGE DEFICIT SCI Goal: RH STG INCREASE KNOWLEDGE OF SELF CARE AFTER SCI Description Mod assist  02/18/2018 1643 by Marylu LundNida, Sagar Tengan W, LPN Outcome: Not Progressing 02/18/2018 1633 by Marylu LundNida, Christinea Brizuela W, LPN Outcome: Not Progressing 02/18/2018 1631 by Marylu LundNida, Edye Hainline W, LPN Outcome: Progressing 02/18/2018 1629 by Marylu LundNida, Charlen Bakula W, LPN Outcome: Progressing

## 2018-02-18 NOTE — Progress Notes (Signed)
Pt is drowsy and lethargic this morning. Refused breakfast, meds and morning care. Dr. Allena KatzPatel made aware. Continue plan of care.  Brandon [name] W Brandon [name]
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
## 2018-02-18 NOTE — Plan of Care (Deleted)
  Problem: Consults Goal: RH SPINAL CORD INJURY PATIENT EDUCATION Description  See Patient Education module for education specifics.  02/18/2018 1631 by Marylu LundNida, Tinlee Navarrette W, LPN Outcome: Progressing 02/18/2018 1629 by Marylu LundNida, Birch Farino W, LPN Outcome: Progressing Goal: Skin Care Protocol Initiated - if Braden Score 18 or less Description If consults are not indicated, leave blank or document N/A 02/18/2018 1631 by Marylu LundNida, Williette Loewe W, LPN Outcome: Progressing 02/18/2018 1629 by Marylu LundNida, Kable Haywood W, LPN Outcome: Progressing   Problem: SCI BOWEL ELIMINATION Goal: RH STG MANAGE BOWEL WITH ASSISTANCE Description STG Manage Bowel with mod Assistance.  02/18/2018 1631 by Marylu LundNida, Vester Titsworth W, LPN Outcome: Progressing 02/18/2018 1629 by Marylu LundNida, Jahleel Stroschein W, LPN Outcome: Progressing Goal: RH STG SCI MANAGE BOWEL PROGRAM W/ASSIST OR AS APPROPRIATE Description STG SCI Manage bowel program w/mod assist or as appropriate.  02/18/2018 1631 by Marylu LundNida, Haylee Mcanany W, LPN Outcome: Progressing 02/18/2018 1629 by Marylu LundNida, Lamont Tant W, LPN Outcome: Progressing   Problem: SCI BLADDER ELIMINATION Goal: RH STG MANAGE BLADDER WITH ASSISTANCE Description STG Manage Bladder With mod Assistance  02/18/2018 1631 by Marylu LundNida, Daizy Outen W, LPN Outcome: Progressing 02/18/2018 1629 by Marylu LundNida, Juliocesar Blasius W, LPN Outcome: Progressing Goal: RH STG MANAGE BLADDER WITH EQUIPMENT WITH ASSISTANCE Description STG Manage Bladder With Equipment With mod Assistance  02/18/2018 1631 by Marylu LundNida, Sarely Stracener W, LPN Outcome: Progressing 02/18/2018 1629 by Marylu LundNida, Danaye Sobh W, LPN Outcome: Progressing Goal: RH STG SCI MANAGE BLADDER PROGRAM W/ASSISTANCE Description Mod assist  02/18/2018 1631 by Marylu LundNida, Robert Sunga W, LPN Outcome: Progressing 02/18/2018 1629 by Marylu LundNida, Jameson Tormey W, LPN Outcome: Progressing   Problem: RH SKIN INTEGRITY Goal: RH STG SKIN FREE OF INFECTION/BREAKDOWN Description Patients skin will remain free from further infection or breakdown with mod assist.  02/18/2018  1631 by Marylu LundNida, Santiago Stenzel W, LPN Outcome: Progressing 02/18/2018 1629 by Marylu LundNida, Jelitza Manninen W, LPN Outcome: Progressing Goal: RH STG MAINTAIN SKIN INTEGRITY WITH ASSISTANCE Description STG Maintain Skin Integrity With mod Assistance.  02/18/2018 1631 by Marylu LundNida, Richel Millspaugh W, LPN Outcome: Progressing 02/18/2018 1629 by Marylu LundNida, Arnice Vanepps W, LPN Outcome: Progressing Goal: RH STG ABLE TO PERFORM INCISION/WOUND CARE W/ASSISTANCE Description STG Able To Perform Incision/Wound Care With total Assistance from caregiver .  02/18/2018 1631 by Marylu LundNida, Canden Cieslinski W, LPN Outcome: Progressing 02/18/2018 1629 by Marylu LundNida, Larya Charpentier W, LPN Outcome: Progressing   Problem: RH SAFETY Goal: RH STG ADHERE TO SAFETY PRECAUTIONS W/ASSISTANCE/DEVICE Description STG Adhere to Safety Precautions With mod Assistance/Device.  02/18/2018 1631 by Marylu LundNida, Eltha Tingley W, LPN Outcome: Progressing 02/18/2018 1629 by Marylu LundNida, Jerome Otter W, LPN Outcome: Progressing   Problem: RH PAIN MANAGEMENT Goal: RH STG PAIN MANAGED AT OR BELOW PT'S PAIN GOAL Description < 4  02/18/2018 1631 by Marylu LundNida, Hennessy Bartel W, LPN Outcome: Progressing 02/18/2018 1629 by Marylu LundNida, Tremond Shimabukuro W, LPN Outcome: Progressing   Problem: RH KNOWLEDGE DEFICIT SCI Goal: RH STG INCREASE KNOWLEDGE OF SELF CARE AFTER SCI Description Mod assist  02/18/2018 1631 by Marylu LundNida, Lina Hitch W, LPN Outcome: Progressing 02/18/2018 1629 by Marylu LundNida, Anaaya Fuster W, LPN Outcome: Progressing

## 2018-02-18 NOTE — Progress Notes (Signed)
ANTICOAGULATION CONSULT NOTE - Follow Up Consult  Pharmacy Consult for warfarin Indication: atrial fibrillation  Allergies  Allergen Reactions  . Flecainide Palpitations and Other (See Comments)    TACHYCARDIA SYNCOPE > LOC  . Sulfamethoxazole Other (See Comments)    Internal bleeding    Patient Measurements: Height: 6\' 1"  (185.4 cm) Weight: 185 lb 10 oz (84.2 kg) IBW/kg (Calculated) : 79.9  Vital Signs: Temp: 97.6 F (36.4 C) (09/15 1030) Temp Source: Oral (09/15 1030) BP: 152/101 (09/15 1030) Pulse Rate: 74 (09/15 1030)  Labs: Recent Labs    02/16/18 0653 02/17/18 0942 02/18/18 0805  HGB 12.2*  --  12.8*  HCT 36.0*  --  38.7*  PLT 196  --  223  LABPROT 27.8* 30.2* 29.8*  INR 2.63 2.92 2.87  CREATININE 0.66  --   --     Estimated Creatinine Clearance: 90.2 mL/min (by C-G formula based on SCr of 0.66 mg/dL).   Medical History: Past Medical History:  Diagnosis Date  . Atrial fibrillation (HCC)     Medications:  Medications Prior to Admission  Medication Sig Dispense Refill Last Dose  . escitalopram (LEXAPRO) 10 MG tablet Take 10 mg by mouth daily.  0 01/15/2018 at Unknown time  . lisinopril (PRINIVIL,ZESTRIL) 20 MG tablet Take 20 mg by mouth daily.  2 01/15/2018 at Unknown time  . metoprolol tartrate (LOPRESSOR) 50 MG tablet Take 75 mg by mouth daily.   0 01/15/2018 at Unknown time  . tamsulosin (FLOMAX) 0.4 MG CAPS capsule Take 0.4 mg by mouth daily with supper.  2 01/15/2018 at Unknown time  . warfarin (COUMADIN) 5 MG tablet Take 2.5-5 mg by mouth See admin instructions. Take 5 mg on Tues, Thurs. Saturday Take 2.5 mg on Mon. Wed. Fri. And Sunday Take in the Morning  0 01/15/2018 at Unknown time    Assessment: 77 y/o male on Coumadin 2.5mg  daily except for 5mg  on TTSat PTA for Afib. INR was 2.66 on admit. Kcentra given due to epidural hematoma. Restarting Coumadin on 8/19 with no bridge. 9/6 INR jumped 2.28>3.32 and dose was held.  INR is stable in  therapeutic range for the past 5 days now (2.87 today). Continue daily dose. Patient's intake is still poor, which will effect coumadin dosing.   Goal of Therapy:  INR 2-3 Monitor platelets by anticoagulation protocol: Yes   Plan:  Coumadin 2mg  PO qday Will continue to monitor daily INR, CBC, s/sx of bleeding, and PO intake  Ewing Schleinolton Tanner Vigna, PharmD PGY1 Pharmacy Resident 02/18/2018    11:05 AM

## 2018-02-18 NOTE — Progress Notes (Signed)
Physical Therapy Session Note  Patient Details  Name: Brandon [name] MRN: [id number] Date of Birth: 1940-07-11  Today's Date: 02/18/2018 PT Individual Time: 1615-1700 PT Individual Time Calculation (min): 45 min  and Today's Date: 02/18/2018 PT Missed Time: 15 Minutes Missed Time Reason: Patient unwilling to participate  Short Term Goals: Week 4:  PT Short Term Goal 1 (Week 4): Pt will maintain arousal for 3 minutes with min cues.  PT Short Term Goal 2 (Week 4): Pt will roll L and R in bed using UE hooking with consistent mod assist.  PT Short Term Goal 3 (Week 4): Pt will tolerate out of bed at 30 degrees of tilt in chair without orthostatic change in blood pressure from supine.    Skilled Therapeutic Interventions/Progress Updates:   Pt asleep at 1st attempt, easily woken up but refusing to participate in therapy, states "I need to rest". Returned at 2nd attempt later in afternoon, pt awake and pleasant, agreeable to participate. Session focused on functional mobility during self-care tasks and OOB tolerance. Performed multiple reps of rolling in each direction during brief change and while donning LE garments w/ total assist. Transferred supine to w/c via maximove for time management, total assist to reposition in w/c however pt able to initiate postures to assist therapist w/ verbal cues. Hand-over-hand assist while pt washed face w/ RUE. Doffed and donned new shirt, total assist, however pt moving trunk and initiating functional movements w/ BUEs w/ verbal and visual cues. Ended session in TIS, resting comfortably, all needs met and call bell within reach.   Therapy Documentation Precautions:  Precautions Precautions: Fall, Cervical Required Braces or Orthoses: Cervical Brace Cervical Brace: Hard collar, Other (comment)(Don/doff in sitting. Ok to doff in bed, when ambulating to bathroom, and when showering per MD order) Restrictions Weight Bearing Restrictions: No General: PT Amount of  Missed Time (min): 15 Minutes PT Missed Treatment Reason: Patient unwilling to participate Vital Signs: Therapy Vitals Temp: 97.7 F (36.5 C) Temp Source: Oral Pulse Rate: 66 Resp: 16 BP: (!) 126/91(LPN notified) Patient Position (if appropriate): Lying Oxygen Therapy SpO2: 99 % O2 Device: Room Air  See Function Navigator for Current Functional Status.   Therapy/Group: Individual Therapy  [name] 02/18/2018, 5:02 PM

## 2018-02-18 NOTE — Progress Notes (Signed)
Occupational Therapy Session Note  Patient Details  Name: Brandon [name] MRN: [id number] Date of Birth: 1941/06/03  Today's Date: 02/18/2018 OT Individual Time: [id number] OT Individual Time Calculation (min): 43 min  32 minutes missed due to lethargy  Short Term Goals: Week 3:  OT Short Term Goal 1 (Week 3): STGS=LTGs secondary to upcoming discharge  Skilled Therapeutic Interventions/Progress Updates:    Pt greeted with NT present. She reported being unable to rouse pt enough to help him eat breakfast. He had eyes closed and mouth open while sleeping. BP assessed with reading of 128/90, 02 sats 98% on RA. OT applied sternal rub to chest and cold wash cloth to face to increase alertness with no success. Then assisted phlebotomist with forearm positioning for blood withdrawal. Pts alertness still unchanged. Provided tactile and auditory stimuli while donning thigh high Teds, ACE wraps and abdominal binder to increase alertness enough for active participation. However pt continued to sleep, able to speak two-three words at most before drifting off again. OT positioned pt for contracture mgt, including bilateral hand splints with forearms positioned in extension using props, and also placed LEs in ankle contracture boots. 32 minutes missed due to fatigue. RN made aware.   Therapy Documentation Precautions:  Precautions Precautions: Fall, Cervical Required Braces or Orthoses: Cervical Brace Cervical Brace: Hard collar, Other (comment)(Don/doff in sitting. Ok to doff in bed, when ambulating to bathroom, and when showering per MD order) Restrictions Weight Bearing Restrictions: No Vital Signs: Therapy Vitals Temp: 97.6 F (36.4 C) Temp Source: Oral Pulse Rate: 74 Resp: (!) 28(reported to LPN) BP: (!) 295/284([id number](reported to LPN) Patient Position (if appropriate): Lying Oxygen Therapy SpO2: 100 % O2 Device: Room Air Pain: Pt grimacing periodically at rest, and when weight-shifting forward to  don binder. Unable to state if he had pain due to somnolescence. RN made aware.  Pain Assessment Faces Pain Scale: Hurts little more(grimacing) Pain Type: Other (Comment)(unable to tell) Pain Location: Generalized Pain Orientation: Right;Left;Posterior Pain Descriptors / Indicators: Grimacing Pain Onset: On-going Pain Intervention(s): Repositioned;Refused(refused to take all meds) ADL: ADL ADL Comments: Please see functional navigator for ADL status     See Function Navigator for Current Functional Status.   Therapy/Group: Individual Therapy  Britini Garcilazo A Tank Difiore 02/18/2018, 12:41 PM

## 2018-02-18 NOTE — Plan of Care (Deleted)
  Problem: Consults Goal: RH SPINAL CORD INJURY PATIENT EDUCATION Description  See Patient Education module for education specifics.  Outcome: Progressing Goal: Skin Care Protocol Initiated - if Braden Score 18 or less Description If consults are not indicated, leave blank or document N/A Outcome: Progressing   Problem: SCI BOWEL ELIMINATION Goal: RH STG MANAGE BOWEL WITH ASSISTANCE Description STG Manage Bowel with mod Assistance.  Outcome: Progressing Goal: RH STG SCI MANAGE BOWEL PROGRAM W/ASSIST OR AS APPROPRIATE Description STG SCI Manage bowel program w/mod assist or as appropriate.  Outcome: Progressing   Problem: SCI BLADDER ELIMINATION Goal: RH STG MANAGE BLADDER WITH ASSISTANCE Description STG Manage Bladder With mod Assistance  Outcome: Progressing Goal: RH STG MANAGE BLADDER WITH EQUIPMENT WITH ASSISTANCE Description STG Manage Bladder With Equipment With mod Assistance  Outcome: Progressing Goal: RH STG SCI MANAGE BLADDER PROGRAM W/ASSISTANCE Description Mod assist  Outcome: Progressing   Problem: RH SKIN INTEGRITY Goal: RH STG SKIN FREE OF INFECTION/BREAKDOWN Description Patients skin will remain free from further infection or breakdown with mod assist.  Outcome: Progressing Goal: RH STG MAINTAIN SKIN INTEGRITY WITH ASSISTANCE Description STG Maintain Skin Integrity With mod Assistance.  Outcome: Progressing Goal: RH STG ABLE TO PERFORM INCISION/WOUND CARE W/ASSISTANCE Description STG Able To Perform Incision/Wound Care With total Assistance from caregiver .  Outcome: Progressing   Problem: RH SAFETY Goal: RH STG ADHERE TO SAFETY PRECAUTIONS W/ASSISTANCE/DEVICE Description STG Adhere to Safety Precautions With mod Assistance/Device.  Outcome: Progressing   Problem: RH PAIN MANAGEMENT Goal: RH STG PAIN MANAGED AT OR BELOW PT'S PAIN GOAL Description < 4  Outcome: Progressing   Problem: RH KNOWLEDGE DEFICIT SCI Goal: RH STG INCREASE KNOWLEDGE  OF SELF CARE AFTER SCI Description Mod assist  Outcome: Progressing   

## 2018-02-18 NOTE — Progress Notes (Addendum)
Patient noted to be lethargic with Baclofen 5, unable to participate in therapies.  Baclofen d/ced.

## 2018-02-18 NOTE — Plan of Care (Deleted)
  Problem: Consults Goal: Skin Care Protocol Initiated - if Braden Score 18 or less Description If consults are not indicated, leave blank or document N/A 02/18/2018 1633 by Marylu LundNida, Cloyde Oregel W, LPN Outcome: Progressing 02/18/2018 1631 by Marylu LundNida, Shaul Trautman W, LPN Outcome: Progressing 02/18/2018 1629 by Marylu LundNida, Caton Popowski W, LPN Outcome: Progressing   Problem: SCI BLADDER ELIMINATION Goal: RH STG MANAGE BLADDER WITH ASSISTANCE Description STG Manage Bladder With mod Assistance  02/18/2018 1633 by Marylu LundNida, Jahmar Mckelvy W, LPN Outcome: Progressing 02/18/2018 1631 by Marylu LundNida, Mykelti Goldenstein W, LPN Outcome: Progressing 02/18/2018 1629 by Marylu LundNida, Dajana Gehrig W, LPN Outcome: Progressing Goal: RH STG MANAGE BLADDER WITH EQUIPMENT WITH ASSISTANCE Description STG Manage Bladder With Equipment With mod Assistance  02/18/2018 1633 by Marylu LundNida, Fareed Fung W, LPN Outcome: Progressing 02/18/2018 1631 by Marylu LundNida, Demoni Gergen W, LPN Outcome: Progressing 02/18/2018 1629 by Marylu LundNida, Aleta Manternach W, LPN Outcome: Progressing   Problem: RH SKIN INTEGRITY Goal: RH STG SKIN FREE OF INFECTION/BREAKDOWN Description Patients skin will remain free from further infection or breakdown with mod assist.  02/18/2018 1633 by Marylu LundNida, Lanitra Battaglini W, LPN Outcome: Progressing 02/18/2018 1631 by Marylu LundNida, Malay Fantroy W, LPN Outcome: Progressing 02/18/2018 1629 by Marylu LundNida, Chandani Rogowski W, LPN Outcome: Progressing Goal: RH STG MAINTAIN SKIN INTEGRITY WITH ASSISTANCE Description STG Maintain Skin Integrity With mod Assistance.  02/18/2018 1633 by Marylu LundNida, Debby Clyne W, LPN Outcome: Progressing 02/18/2018 1631 by Marylu LundNida, Trequan Marsolek W, LPN Outcome: Progressing 02/18/2018 1629 by Marylu LundNida, Alveda Vanhorne W, LPN Outcome: Progressing Goal: RH STG ABLE TO PERFORM INCISION/WOUND CARE W/ASSISTANCE Description STG Able To Perform Incision/Wound Care With total Assistance from caregiver .  02/18/2018 1633 by Marylu LundNida, Tere Mcconaughey W, LPN Outcome: Progressing 02/18/2018 1631 by Marylu LundNida, Leonette Tischer W, LPN Outcome: Progressing 02/18/2018 1629 by  Marylu LundNida, Katena Petitjean W, LPN Outcome: Progressing   Problem: RH SAFETY Goal: RH STG ADHERE TO SAFETY PRECAUTIONS W/ASSISTANCE/DEVICE Description STG Adhere to Safety Precautions With mod Assistance/Device.  02/18/2018 1633 by Marylu LundNida, Alixandria Friedt W, LPN Outcome: Progressing 02/18/2018 1631 by Marylu LundNida, Moet Mikulski W, LPN Outcome: Progressing 02/18/2018 1629 by Marylu LundNida, Ambrosia Wisnewski W, LPN Outcome: Progressing   Problem: RH PAIN MANAGEMENT Goal: RH STG PAIN MANAGED AT OR BELOW PT'S PAIN GOAL Description < 4  02/18/2018 1633 by Marylu LundNida, Idalys Konecny W, LPN Outcome: Progressing 02/18/2018 1631 by Marylu LundNida, Huldah Marin W, LPN Outcome: Progressing 02/18/2018 1629 by Marylu LundNida, Ron Beske W, LPN Outcome: Progressing

## 2018-02-19 ENCOUNTER — Inpatient Hospital Stay (HOSPITAL_COMMUNITY): Payer: Medicare Other

## 2018-02-19 ENCOUNTER — Inpatient Hospital Stay (HOSPITAL_COMMUNITY): Payer: Medicare Other | Admitting: Occupational Therapy

## 2018-02-19 ENCOUNTER — Inpatient Hospital Stay (HOSPITAL_COMMUNITY): Payer: Medicare Other | Admitting: Physical Therapy

## 2018-02-19 LAB — PROTIME-INR
INR: 2.56
PROTHROMBIN TIME: 27.3 s — AB (ref 11.4–15.2)

## 2018-02-19 LAB — CBC
HCT: 39.2 % (ref 39.0–52.0)
Hemoglobin: 13.2 g/dL (ref 13.0–17.0)
MCH: 32.5 pg (ref 26.0–34.0)
MCHC: 33.7 g/dL (ref 30.0–36.0)
MCV: 96.6 fL (ref 78.0–100.0)
PLATELETS: 191 10*3/uL (ref 150–400)
RBC: 4.06 MIL/uL — ABNORMAL LOW (ref 4.22–5.81)
RDW: 13.2 % (ref 11.5–15.5)
WBC: 5.5 10*3/uL (ref 4.0–10.5)

## 2018-02-19 LAB — BASIC METABOLIC PANEL
Anion gap: 11 (ref 5–15)
BUN: 18 mg/dL (ref 8–23)
CALCIUM: 8.7 mg/dL — AB (ref 8.9–10.3)
CO2: 24 mmol/L (ref 22–32)
CREATININE: 0.61 mg/dL (ref 0.61–1.24)
Chloride: 103 mmol/L (ref 98–111)
GFR calc Af Amer: >60 mL/min (ref >60)
GLUCOSE: 94 mg/dL (ref 70–99)
Potassium: 3.9 mmol/L (ref 3.5–5.1)
SODIUM: 138 mmol/L (ref 135–145)

## 2018-02-19 MED ORDER — QUETIAPINE FUMARATE 25 MG PO TABS
12.5000 mg | ORAL_TABLET | Freq: Every day | ORAL | Status: DC
  Administered 2018-02-19 – 2018-02-22 (×4): 12.5 mg via ORAL
  Filled 2018-02-19 (×4): qty 1
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
## 2018-02-19 NOTE — Progress Notes (Signed)
Physical Therapy Session Note  Patient Details  Name: Brandon [name] MRN: [id number] Date of Birth: 17-Jun-1940  Today's Date: 02/19/2018 PT Individual Time: 0900-1000 PT Individual Time Calculation (min): 60 min   Short Term Goals: Week 4:  PT Short Term Goal 1 (Week 4): Pt will maintain arousal for 3 minutes with min cues.  PT Short Term Goal 2 (Week 4): Pt will roll L and R in bed using UE hooking with consistent mod assist.  PT Short Term Goal 3 (Week 4): Pt will tolerate out of bed at 30 degrees of tilt in chair without orthostatic change in blood pressure from supine.    Skilled Therapeutic Interventions/Progress Updates:    pt with no c/o pain on arrival, alert, states he feels like his bladder is full.  RN in to scan and pt without sufficient volume to warrant cath.  Session focus on maintaining arousal and LE stretching during rolling L/R for hygiene 2/2 incontinent void, donning BP management devices, and LB dressing.    Pt rolls L and R throughout session with up to mod assist and cues for initiation of task.  PT provided total assist for hygiene, and donning teds, aces, abdominal binder, and LB dressing.  Pt requires max cues throughout session to maintain arousal and attention to task.  Transferred to w/c via hoyer lift 2/2 no +2 available for slide board this session.  Positioned in w/c with pillows to reduce lateral trunk lean and LEs elevated on chair in front of pt for BP management.  Call bell in reach and chair alarm intact.  Therapy Documentation Precautions:  Precautions Precautions: Fall, Cervical Required Braces or Orthoses: Cervical Brace Cervical Brace: Hard collar, Other (comment)(Don/doff in sitting. Ok to doff in bed, when ambulating to bathroom, and when showering per MD order) Restrictions Weight Bearing Restrictions: No   See Function Navigator for Current Functional Status.   Therapy/Group: Individual Therapy  Stephania FragminCaitlin E Freddie Dymek 02/19/2018, 10:00 AM

## 2018-02-19 NOTE — Progress Notes (Signed)
ANTICOAGULATION CONSULT NOTE - Follow Up Consult  Pharmacy Consult for warfarin Indication: atrial fibrillation  Allergies  Allergen Reactions  . Flecainide Palpitations and Other (See Comments)    TACHYCARDIA SYNCOPE > LOC  . Sulfamethoxazole Other (See Comments)    Internal bleeding    Patient Measurements: Height: 6\' 1"  (185.4 cm) Weight: 185 lb 10 oz (84.2 kg) IBW/kg (Calculated) : 79.9  Vital Signs: Temp: 98 F (36.7 C) (09/16 0359) BP: 114/77 (09/16 0359) Pulse Rate: 60 (09/16 0359)  Labs: Recent Labs    02/17/18 0942 02/18/18 0805 02/19/18 0633  HGB  --  12.8* 13.2  HCT  --  38.7* 39.2  PLT  --  223 191  LABPROT 30.2* 29.8* 27.3*  INR 2.92 2.87 2.56    Estimated Creatinine Clearance: 90.2 mL/min (by C-G formula based on SCr of 0.66 mg/dL).   Medical History: Past Medical History:  Diagnosis Date  . Atrial fibrillation (HCC)     Medications:  Medications Prior to Admission  Medication Sig Dispense Refill Last Dose  . escitalopram (LEXAPRO) 10 MG tablet Take 10 mg by mouth daily.  0 01/15/2018 at Unknown time  . lisinopril (PRINIVIL,ZESTRIL) 20 MG tablet Take 20 mg by mouth daily.  2 01/15/2018 at Unknown time  . metoprolol tartrate (LOPRESSOR) 50 MG tablet Take 75 mg by mouth daily.   0 01/15/2018 at Unknown time  . tamsulosin (FLOMAX) 0.4 MG CAPS capsule Take 0.4 mg by mouth daily with supper.  2 01/15/2018 at Unknown time  . warfarin (COUMADIN) 5 MG tablet Take 2.5-5 mg by mouth See admin instructions. Take 5 mg on Tues, Thurs. Saturday Take 2.5 mg on Mon. Wed. Fri. And Sunday Take in the Morning  0 01/15/2018 at Unknown time    Assessment: 77 y/o male on Coumadin 2.5mg  daily exc for 5mg  on TTSat PTA for Afib. INR was 2.66 on admit. Kcentra given due to epidural hematoma.  Coumadin restarted 8/29. Currently on daily dose of 2mg . INR remains therapeutic at 2.56. Hgb and plts wnl. Intake still poor.  Goal of Therapy:  INR 2-3 Monitor platelets by  anticoagulation protocol: Yes   Plan:  Coumadin 2mg  PO qday Will continue to monitor daily INR, CBC, s/sx of bleeding, and PO intake  Enzo BiNathan Nilo Fallin, PharmD, BCPS Clinical Pharmacist Phone number [phone] 02/19/2018 7:56 AM

## 2018-02-19 NOTE — Progress Notes (Addendum)
Occupational Therapy Session Note  Patient Details  Name: Brandon [name] MRN: [id number] Date of Birth: [birth year]  Today's Date: 02/19/2018 OT Individual Time: [id number] OT Individual Time Calculation (min): 41 min   Skilled Therapeutic Interventions/Progress Updates:    Pt greeted in w/c with no c/o pain and no s/s orthostatics. He was agreeable to complete grooming tasks w/c level at sink to work on UE NMR. Pt exhibiting Lt wrist control, as well as grasp/release capabilities! He used built up toothbrush to complete oral care with step by step cues for sequencing. When OT informed him that he was using his Lt hand functionally, he started smiling and kept turning faucet on/off to see his hand grip the lever. Pt able to manage faucet handles during face washing and hand washing after. Mod A for anterior weight shifting to reach sink/controls due to LEs elevated beneath him. Able to use B UEs grossly to apply lotion with cuing and Mod A. Also able to "punch" each UE into shirt sleeve and elevate arms overhead while OT donned clean shirt. At end of session pt was reclined in TIS with safety belt fastened, LEs elevated, and call bell clipped to shirt.    Therapy Documentation Precautions:  Precautions Precautions: Fall, Cervical Required Braces or Orthoses: Cervical Brace Cervical Brace: Hard collar, Other (comment)(Don/doff in sitting. Ok to doff in bed, when ambulating to bathroom, and when showering per MD order) Restrictions Weight Bearing Restrictions: No Vital Signs: Therapy Vitals Temp: 97.8 F (36.6 C) Temp Source: Oral Pulse Rate: 67 Resp: 20 BP: 100/61 Patient Position (if appropriate): Lying Oxygen Therapy SpO2: 100 % O2 Device: Room Air Pain: Pain Assessment Pain Score: 0-No pain ADL: ADL ADL Comments: Please see functional navigator for ADL status     See Function Navigator for Current Functional Status.   Therapy/Group: Individual Therapy  Essie Gehret A  Brandis Wixted 02/19/2018, 4:19 PM

## 2018-02-19 NOTE — Progress Notes (Signed)
Speech Language Pathology Daily Session Note  Patient Details  Name: Brandon [name] MRN: [id number] Date of Birth: 07/19/1940  Today's Date: 02/19/2018 SLP Individual Time: 1302-1350 SLP Individual Time Calculation (min): 48 min  Short Term Goals: Week 5: SLP Short Term Goal 1 (Week 5): Pt will sustain attention to basic familiar tasks for ~ 5 minutes with Max A cues.  SLP Short Term Goal 2 (Week 5): Pt will communicate intelligible basic wants and needs with Mod A.  SLP Short Term Goal 3 (Week 5): Given 2 choices related to orientation information, pt will select correct choice in 8 of 10 opportunities with Max A cues.   Skilled Therapeutic Interventions:Skilled ST services focused on cognitive skills. SLP facilitated facilitated orientation to time and place, pt required mod-max A verbal cues. SLP facilitated sustained attention given simple picture description task, pt demonstrated sustained attention in 3-5 minute intervals with mod A verbal cues. When questions pt expressed the need to urination, however given urinal unable to urinate but brief was wet with pt denied. SLP and nurse facilitated changing of brief utilizing hoyer lift, pt demonstrated sustained attention during functional task. Pt was left in room with call bell within reach and bed alarm set. Recommend to continue skilled ST services.       Function:  Eating Eating                 Cognition Comprehension Comprehension assist level: Understands basic 50 - 74% of the time/ requires cueing 25 - 49% of the time  Expression   Expression assist level: Expresses basic 50 - 74% of the time/requires cueing 25 - 49% of the time. Needs to repeat parts of sentences.  Social Interaction Social Interaction assist level: Interacts appropriately 25 - 49% of time - Needs frequent redirection.  Problem Solving Problem solving assist level: Solves basic less than 25% of the time - needs direction nearly all the time or does not  effectively solve problems and may need a restraint for safety  Memory Memory assist level: Recognizes or recalls less than 25% of the time/requires cueing greater than 75% of the time    Pain Pain Assessment Pain Score: 0-No pain  Therapy/Group: Individual Therapy  Hitomi Slape  [facility] 02/19/2018, 1:55 PM

## 2018-02-20 ENCOUNTER — Inpatient Hospital Stay (HOSPITAL_COMMUNITY): Payer: Medicare Other | Admitting: Occupational Therapy

## 2018-02-20 ENCOUNTER — Inpatient Hospital Stay (HOSPITAL_COMMUNITY): Payer: Medicare Other

## 2018-02-20 ENCOUNTER — Inpatient Hospital Stay (HOSPITAL_COMMUNITY): Payer: Medicare Other | Admitting: Physical Therapy

## 2018-02-20 ENCOUNTER — Inpatient Hospital Stay (HOSPITAL_COMMUNITY): Payer: Medicare Other | Admitting: Speech Pathology

## 2018-02-20 LAB — CBC
HCT: 37.1 % — ABNORMAL LOW (ref 39.0–52.0)
HEMOGLOBIN: 12.4 g/dL — AB (ref 13.0–17.0)
MCH: 32 pg (ref 26.0–34.0)
MCHC: 33.4 g/dL (ref 30.0–36.0)
MCV: 95.9 fL (ref 78.0–100.0)
PLATELETS: 210 10*3/uL (ref 150–400)
RBC: 3.87 MIL/uL — AB (ref 4.22–5.81)
RDW: 13.3 % (ref 11.5–15.5)
WBC: 5 10*3/uL (ref 4.0–10.5)

## 2018-02-20 LAB — PROTIME-INR
INR: 2.87
PROTHROMBIN TIME: 29.8 s — AB (ref 11.4–15.2)

## 2018-02-20 NOTE — Progress Notes (Signed)
Physical Therapy Session Note  Patient Details  Name: Brandon [name] MRN: [id number] Date of Birth: February 02, 1941  Today's Date: 02/20/2018 PT Individual Time: 1000-1100 PT Individual Time Calculation (min): 60 min   Short Term Goals: Week 4:  PT Short Term Goal 1 (Week 4): Pt will maintain arousal for 3 minutes with min cues.  PT Short Term Goal 2 (Week 4): Pt will roll L and R in bed using UE hooking with consistent mod assist.  PT Short Term Goal 3 (Week 4): Pt will tolerate out of bed at 30 degrees of tilt in chair without orthostatic change in blood pressure from supine.    Skilled Therapeutic Interventions/Progress Updates:    no c/o pain at rest, does endorse some discomfort in neck/shoulders with sitting edge of mat.  Monitored throughout session.  Session focus on sitting balance and postural control.   Pt completes slide board transfer from w/c to therapy mat on R with max assist +1 (+2 to set up slide board).  Sitting balance edge of mat with close supervision and mod verbal cues to maintain forward weight shift and UE support on mat.  Pt engaged in ball tap with alternating UEs, min fade to max assist for sitting balance, 4x5 reps with rest breaks between each set for fatigue and pain management.  Pt noted to have significantly curved posture with posterior pelvic tilt, thoracic kyphosis, and forward head.  Transition to supine with +2 assist and 2 towel rolls positioned along spine for supine pec stretch with slight overpressure from PT at shoulders x5 minutes. Returned to sitting with min assist to position LLE for rolling to R, and total assist +1 for side lying>sit.  Pt requires +2 assist for slide board transfer back to w/c on L.  Pt positioned in tilt in space w/c with elevating leg rests, chair alarm in tact, and positioned in nursing station for increased social interaction.    Therapy Documentation Precautions:  Precautions Precautions: Fall, Cervical Required Braces or  Orthoses: Cervical Brace Cervical Brace: Hard collar, Other (comment)(Don/doff in sitting. Ok to doff in bed, when ambulating to bathroom, and when showering per MD order) Restrictions Weight Bearing Restrictions: No   See Function Navigator for Current Functional Status.   Therapy/Group: Individual Therapy  Stephania FragminCaitlin E Jak Haggar 02/20/2018, 11:35 AM
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
## 2018-02-20 NOTE — Progress Notes (Signed)
ANTICOAGULATION CONSULT NOTE - Follow Up Consult  Pharmacy Consult for warfarin Indication: atrial fibrillation  Allergies  Allergen Reactions  . Flecainide Palpitations and Other (See Comments)    TACHYCARDIA SYNCOPE > LOC  . Sulfamethoxazole Other (See Comments)    Internal bleeding    Patient Measurements: Height: 6\' 1"  (185.4 cm) Weight: 185 lb 10 oz (84.2 kg) IBW/kg (Calculated) : 79.9  Vital Signs: Temp: 97.8 F (36.6 C) (09/17 0615) Temp Source: Oral (09/17 0615) BP: 93/72 (09/17 0615) Pulse Rate: 60 (09/17 0615)  Labs: Recent Labs    02/18/18 0805 02/19/18 [id number] 02/20/18 0713  HGB 12.8* 13.2 12.4*  HCT 38.7* 39.2 37.1*  PLT 223 191 210  LABPROT 29.8* 27.3* 29.8*  INR 2.87 2.56 2.87  CREATININE  --  0.61  --     Estimated Creatinine Clearance: 90.2 mL/min (by C-G formula based on SCr of 0.61 mg/dL).   Medical History: Past Medical History:  Diagnosis Date  . Atrial fibrillation (HCC)     Medications:  Medications Prior to Admission  Medication Sig Dispense Refill Last Dose  . escitalopram (LEXAPRO) 10 MG tablet Take 10 mg by mouth daily.  0 01/15/2018 at Unknown time  . lisinopril (PRINIVIL,ZESTRIL) 20 MG tablet Take 20 mg by mouth daily.  2 01/15/2018 at Unknown time  . metoprolol tartrate (LOPRESSOR) 50 MG tablet Take 75 mg by mouth daily.   0 01/15/2018 at Unknown time  . tamsulosin (FLOMAX) 0.4 MG CAPS capsule Take 0.4 mg by mouth daily with supper.  2 01/15/2018 at Unknown time  . warfarin (COUMADIN) 5 MG tablet Take 2.5-5 mg by mouth See admin instructions. Take 5 mg on Tues, Thurs. Saturday Take 2.5 mg on Mon. Wed. Fri. And Sunday Take in the Morning  0 01/15/2018 at Unknown time    Assessment: 77 y/o male on Coumadin 2.5mg  daily exc for 5mg  on TTSat PTA for Afib. INR was 2.66 on admit. Kcentra given due to epidural hematoma.  Coumadin restarted 8/29. Currently on daily dose of 2mg . INR remains therapeutic at 2.87. Hgb and plts stable. Intake  still poor.  Goal of Therapy:  INR 2-3 Monitor platelets by anticoagulation protocol: Yes   Plan:  Coumadin 2mg  PO qday Will continue to monitor daily INR, CBC, s/sx of bleeding, and PO intake  Enzo BiNathan Suesan Mohrmann, PharmD, BCPS Clinical Pharmacist Phone number [phone] 02/20/2018 8:02 AM
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
## 2018-02-21 ENCOUNTER — Inpatient Hospital Stay (HOSPITAL_COMMUNITY): Payer: Medicare Other | Admitting: Occupational Therapy

## 2018-02-21 ENCOUNTER — Encounter (HOSPITAL_COMMUNITY): Payer: Medicare Other | Admitting: Psychology

## 2018-02-21 ENCOUNTER — Inpatient Hospital Stay (HOSPITAL_COMMUNITY): Payer: Medicare Other | Admitting: Physical Therapy

## 2018-02-21 ENCOUNTER — Inpatient Hospital Stay (HOSPITAL_COMMUNITY): Payer: Medicare Other | Admitting: Speech Pathology

## 2018-02-21 LAB — CBC
HCT: 38.5 % — ABNORMAL LOW (ref 39.0–52.0)
HEMOGLOBIN: 12.7 g/dL — AB (ref 13.0–17.0)
MCH: 32 pg (ref 26.0–34.0)
MCHC: 33 g/dL (ref 30.0–36.0)
MCV: 97 fL (ref 78.0–100.0)
Platelets: 226 10*3/uL (ref 150–400)
RBC: 3.97 MIL/uL — ABNORMAL LOW (ref 4.22–5.81)
RDW: 13.3 % (ref 11.5–15.5)
WBC: 5.4 10*3/uL (ref 4.0–10.5)

## 2018-02-21 LAB — PROTIME-INR
INR: 2.67
Prothrombin Time: 28.2 seconds — ABNORMAL HIGH (ref 11.4–15.2)

## 2018-02-21 MED ORDER — MIDODRINE HCL 5 MG PO TABS
5.0000 mg | ORAL_TABLET | Freq: Two times a day (BID) | ORAL | Status: DC
  Administered 2018-02-21 – 2018-02-27 (×13): 5 mg via ORAL
  Filled 2018-02-21 (×13): qty 1

## 2018-02-21 MED ORDER — INFLUENZA VAC SPLIT HIGH-DOSE 0.5 ML IM SUSY
0.5000 mL | PREFILLED_SYRINGE | INTRAMUSCULAR | Status: AC
  Administered 2018-02-22: 0.5 mL via INTRAMUSCULAR
  Filled 2018-02-21: qty 0.5

## 2018-02-21 NOTE — Patient Care Conference (Addendum)
Inpatient RehabilitationTeam Conference and Plan of Care Update Date: 02/20/2018   Time: 2:00 PM    Patient Name: Brandon [name]      Medical Record Number: [id number]  Date of Birth: 07/26/1940 Sex: Male         Room/Bed: 4W11C/4W11C-01 Payor Info: Payor: MEDICARE / Plan: MEDICARE PART A AND B / Product Type: *No Product type* /    Admitting Diagnosis: Trauma SCi Mild TBI  Admit Date/Time:  01/19/2018  4:39 PM Admission Comments: No comment available   Primary Diagnosis:  <principal problem not specified> Principal Problem: <principal problem not specified>  Patient Active Problem List   Diagnosis Date Noted  . Labile blood pressure   . Lethargy   . Hypokalemia   . Acute blood loss anemia   . Hypersomnia with sleep apnea   . Hypnagogic hallucinations   . Dysphagia   . Neurogenic bowel   . Neurogenic bladder   . Essential hypertension   . PAF (paroxysmal atrial fibrillation) (HCC)   . Postoperative pain   . 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 Injury of cervical spine (HCC) 01/15/2018  . 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 Obstructive sleep apnea 01/31/2014    Expected Discharge Date: Expected Discharge Date: (SNF)  Team Members Present: Physician leading conference: Dr. Faith RogueZachary Swartz Social Worker Present: Amada JupiterLucy Valarie Farace, LCSW Nurse Present: Other (comment)(Latoya Marciano SequinFoote, LPN) PT Present: Teodoro Kilaitlin Penven-Crew, PT OT Present: Callie FieldingKatie Pittman, OT SLP Present: Colin BentonMadison Cratch, SLP PPS Coordinator present : Tora DuckMarie Noel, RN, CRRN     Current Status/Progress Goal Weekly Team Focus  Medical   UTI treated. still with confusion and hallucinations at time. BP holding better, some elevation at rest  continue maximizing bp, cognition  bp control, bowel and bladder, cognition   Bowel/Bladder   Pt is incontinent of bowel and bladder. LBM 9/16  Pt will mod assist during toileting   Pt will  mod assist with turning in bed during toileting    Swallow/Nutrition/ Hydration             ADL's   Continues to be +2 assist for slideboard transfers + self care bedlevel. Continues to exhibit profound lethargy/confusion.   mod A overall   Cognitive remediation, trunk control, NMR, functional transfers, balance, pt/family education    Mobility   total +2 for transfers, intermittent supervision up to mod for sitting balance, inconsistent cognition/orientation   dependent +1 for transfers, max for dynamic sitting balance      Communication   Mod A  mod assist   intelligibility and express wants/needs   Safety/Cognition/ Behavioral Observations  Max-Mod A   Total-Max A   sustained attention, orientation,    Pain   No pain  Pt will verbalize if in pain and report pain < 3 on 0-10 scale   Pt will remain free from pain   Skin   Pt has MASD to the groin bilaterally   Pt will remain free of skin breakdown and MASD will resolve   MASD will be treated and resolved     Rehab Goals Patient on target to meet rehab goals: No Rehab Goals Revised: Pt making slow progress; has a ways to go with rehabilitation *See Care Plan and progress notes for long and short-term goals.     Barriers to Discharge  Current Status/Progress Possible Resolutions Date Resolved   Physician    Neurogenic Bowel & Bladder;Medical stability  stabilize medically for next venue of care      Nursing                  PT                    OT                  SLP                SW                Discharge Planning/Teaching Needs:   d/c plan to be SNF due to goals and daughter's wishes to pursue SNF in order to continue with daily therapies.  NA   Team Discussion:  Very little change in overall function or cognition this past week.  UTI treated but will recheck UA.  incont of b/b;  No skin issues.  Able to transfer with +1 at times but total assistance.  Orientation still variable.  Team and MD feel pt ready  to transition to SNF.  Revisions to Treatment Plan:  NA    Continued Need for Acute Rehabilitation Level of Care: The patient requires daily medical management by a physician with specialized training in physical medicine and rehabilitation for the following conditions: Daily direction of a multidisciplinary physical rehabilitation program to ensure safe treatment while eliciting the highest outcome that is of practical value to the patient.: Yes Daily medical management of patient stability for increased activity during participation in an intensive rehabilitation regime.: Yes Daily analysis of laboratory values and/or radiology reports with any subsequent need for medication adjustment of medical intervention for : Neurological problems;Blood pressure problems   I attest that I was present, lead the team conference, and concur with the assessment and plan of the team.   Alexy Bringle 02/21/2018, 4:15 PM
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
## 2018-02-21 NOTE — Progress Notes (Signed)
Occupational Therapy Session Note  Patient Details  Name: Brandon [name] MRN: [id number] Date of Birth: 1940-08-03  Today's Date: 02/21/2018 OT Individual Time: [id number] OT Individual Time Calculation (min): 57 min    Short Term Goals: Week 4:  OT Short Term Goal 1 (Week 4): Pt will maintain sustained focus for 5 minutes during functional task.  OT Short Term Goal 2 (Week 4): Pt will verbalize hygiene needs to staff when given min verbal questioning cues.  Skilled Therapeutic Interventions/Progress Updates:    Upon entering the room, pt supine in bed and sleeping soundly. OT wrapping B LEs and donning TED hose on pt via total A. Pt rolling L <> R with total A to don abdominal binder. Pt incontinent of bowel and bladder during this session with no awareness and continues to remain asleep throughout hygiene and changing progress. Pt remained in bed for safety and due to lethargy. RN notified.Soft call bell placed within reach, mats places on floor, and bed alarm activated for safety.   Therapy Documentation Precautions:  Precautions Precautions: Fall, Cervical Required Braces or Orthoses: Cervical Brace Cervical Brace: Hard collar, Other (comment)(Don/doff in sitting. Ok to doff in bed, when ambulating to bathroom, and when showering per MD order) Restrictions Weight Bearing Restrictions: No ADL: ADL ADL Comments: Please see functional navigator for ADL status  See Function Navigator for Current Functional Status.   Therapy/Group: Individual Therapy  Alen BleacherBradsher, Edell Mesenbrink P 02/21/2018, 12:53 PM

## 2018-02-21 NOTE — Progress Notes (Signed)
Social Work Patient ID: Brandon [name], male   DOB: [birth year], 77 y.o.   MRN: [id number]  Have reviewed team conference with pt's daughter, Clydie BraunKaren.  She had questions about "status of UTI" and is now aware that abx were completed, however, MD is rechecking today.  She has questions about the start of seroquel during the day.  Have alerted [name], PA of the questions and asked that he or MD contact daughter once UA results are in.   Daughter also plans to bring in pt's CPAP machine if she can locate it at pt's home to see if that is helpful in any way to pt's sleep.  She is aware that team feels pt at a level appropriate to transition to SNF when bed available.  Will continue to follow.  [name], LCSW

## 2018-02-21 NOTE — Progress Notes (Signed)
ANTICOAGULATION CONSULT NOTE - Follow Up Consult  Pharmacy Consult for warfarin Indication: atrial fibrillation  Allergies  Allergen Reactions  . Flecainide Palpitations and Other (See Comments)    TACHYCARDIA SYNCOPE > LOC  . Sulfamethoxazole Other (See Comments)    Internal bleeding    Patient Measurements: Height: 6\' 1"  (185.4 cm) Weight: 185 lb 10 oz (84.2 kg) IBW/kg (Calculated) : 79.9  Vital Signs: Temp: 98 F (36.7 C) (09/18 0549) Temp Source: Oral (09/17 1925) BP: 137/89 (09/18 0549) Pulse Rate: 68 (09/18 0549)  Labs: Recent Labs    02/19/18 0633 02/20/18 0713 02/21/18 0555  HGB 13.2 12.4* 12.7*  HCT 39.2 37.1* 38.5*  PLT 191 210 226  LABPROT 27.3* 29.8* 28.2*  INR 2.56 2.87 2.67  CREATININE 0.61  --   --     Estimated Creatinine Clearance: 90.2 mL/min (by C-G formula based on SCr of 0.61 mg/dL).   Medical History: Past Medical History:  Diagnosis Date  . Atrial fibrillation (HCC)     Medications:  Medications Prior to Admission  Medication Sig Dispense Refill Last Dose  . escitalopram (LEXAPRO) 10 MG tablet Take 10 mg by mouth daily.  0 01/15/2018 at Unknown time  . lisinopril (PRINIVIL,ZESTRIL) 20 MG tablet Take 20 mg by mouth daily.  2 01/15/2018 at Unknown time  . metoprolol tartrate (LOPRESSOR) 50 MG tablet Take 75 mg by mouth daily.   0 01/15/2018 at Unknown time  . tamsulosin (FLOMAX) 0.4 MG CAPS capsule Take 0.4 mg by mouth daily with supper.  2 01/15/2018 at Unknown time  . warfarin (COUMADIN) 5 MG tablet Take 2.5-5 mg by mouth See admin instructions. Take 5 mg on Tues, Thurs. Saturday Take 2.5 mg on Mon. Wed. Fri. And Sunday Take in the Morning  0 01/15/2018 at Unknown time    Assessment: 77 y/o male on Coumadin 2.5mg  daily exc for 5mg  on TTSat PTA for Afib. INR was 2.66 on admit. Kcentra given due to epidural hematoma.  Coumadin restarted 8/29. Currently on daily dose of 2mg . INR remains therapeutic at 2.67. Hgb and plts stable.  Goal of  Therapy:  INR 2-3 Monitor platelets by anticoagulation protocol: Yes   Plan:  Coumadin 2 mg PO qday Monitor daily INR, CBC, s/s of bleed  Enzo BiNathan Danali Marinos, PharmD, BCPS Clinical Pharmacist Phone number [phone] 02/21/2018 7:25 AM

## 2018-02-21 NOTE — Progress Notes (Signed)
Speech Language Pathology Weekly Progress and Session Note  Patient Details  Name: Brandon [name] MRN: [id number] Date of Birth: 03-22-1941  Beginning of progress report period: February 15, 2018 End of progress report period: February 21, 2018  Today's Date: 02/21/2018 SLP Individual Time: 3664-4034 SLP Individual Time Calculation (min): 38 min  Short Term Goals: Week 4: SLP Short Term Goal 1 (Week 4): Pt will sustain attention to basic familiar tasks for ~ 5 minutes with Max A cues.  SLP Short Term Goal 1 - Progress (Week 4): Not met SLP Short Term Goal 2 (Week 4): Pt will communicate intelligible basic wants and needs with Mod A.  SLP Short Term Goal 2 - Progress (Week 4): Not met SLP Short Term Goal 3 (Week 4): Given 2 choices related to orientation information, pt will select correct choice in 8 of 10 opportunities with Max A cues.  SLP Short Term Goal 3 - Progress (Week 4): Not met    New Short Term Goals: Week 5: SLP Short Term Goal 1 (Week 5): Pt will sustain attention to basic familiar tasks for ~ 5 minutes with Max A cues.  SLP Short Term Goal 1 - Progress (Week 5): Not met SLP Short Term Goal 2 (Week 5): Pt will communicate intelligible basic wants and needs with Mod A.  SLP Short Term Goal 2 - Progress (Week 5): Not met SLP Short Term Goal 3 (Week 5): Given 2 choices related to orientation information, pt will select correct choice in 8 of 10 opportunities with Max A cues.  SLP Short Term Goal 3 - Progress (Week 5): Not met  Weekly Progress Updates: Pt has not made any cognitive progress towards goals and continues to demonstrate confusion, hallucinations and decreased arousal that impede progress towards goals. Pt continues to be Total A for all cognitive abilities and is inconsistent from moment to moment with arousal/deep unarousable sleep. PA has changed pt to QD d/t lack of progress in therapy and missed minutes d/t decreased arousal. Pt will require Total A at discharge  from CIR. Recommend SNF placement.      Intensity: Minumum of 1-2 x/day, 30 to 90 minutes Frequency: 3 to 5 out of 7 days Duration/Length of Stay: Pending SNF placement Treatment/Interventions: Cognitive remediation/compensation;Cueing hierarchy;Internal/external aids;Patient/family education;Functional tasks   Daily Session  Skilled Therapeutic Interventions: Skilled treatment session focused on cognition. SLP observed pt alert and mildly interactive while sitting at nursing station. Given that pt is seldom alert/interactive, SLP sat with pt at nursing station and with Mod A cues, pt consumed 4 oz of thin liquids via straw. Pt with tangential/off-topic disorganized thoughts and was not able to be rediected but remained alert for 38 minutes for completion of drinking lemonade. Pt left upright at nursing station in tilt-in-space wheelchair.        Function:   Eating Eating   Modified Consistency Diet: No(thin liquids ) Eating Assist Level: Set up assist for;Help managing cup/glass           Cognition Comprehension Comprehension assist level: Understands basic less than 25% of the time/ requires cueing >75% of the time;Understands basic 25 - 49% of the time/ requires cueing 50 - 75% of the time  Expression   Expression assist level: Expresses basic 25 - 49% of the time/requires cueing 50 - 75% of the time. Uses single words/gestures.  Social Interaction Social Interaction assist level: Interacts appropriately 25 - 49% of time - Needs frequent redirection.;Interacts appropriately less than 25% of the time.  May be withdrawn or combative.  Problem Solving Problem solving assist level: Solves basic less than 25% of the time - needs direction nearly all the time or does not effectively solve problems and may need a restraint for safety  Memory Memory assist level: Recognizes or recalls less than 25% of the time/requires cueing greater than 75% of the time   General    Pain Pain  Assessment Pain Scale: 0-10 Pain Score: 0-No pain Faces Pain Scale: No hurt  Therapy/Group: Individual Therapy  Brandon [name] 02/21/2018, 2:24 PM
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
## 2018-02-21 NOTE — Plan of Care (Signed)
Pt's plan of care adjusted to QD after speaking with care team and discussed with MD in team conference as pt currently unable to tolerate current therapy schedule with OT, PT, and SLP.   Estill Doomsaitlin Artisha Capri, PT, DPT  02/21/18 1:26 PM
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
## 2018-02-21 NOTE — Progress Notes (Signed)
Physical Therapy Weekly Progress Note  Patient Details  Name: Brandon [name] MRN: [id number] Date of Birth: [birth year]  Beginning of progress report period: February 12, 2018 End of progress report period: February 21, 2018  Today's Date: 02/21/2018 PT Individual Time: 1100-1200 PT Individual Time Calculation (min): 60 min   Patient has met 2 of 3 short term goals.  Pt continues to make slow, inconsistent progress with therapy.  He is most limited by significant cognitive deficits including decreased arousal, decreased orientation, and hallucinations.  Pt continues to require +2 assist for transfers, but has improved with bed mobility and somewhat with static sitting balance depending on arousal.  Patient continues to demonstrate the following deficits muscle weakness, muscle joint tightness and muscle paralysis, impaired timing and sequencing, abnormal tone, motor apraxia, decreased coordination and decreased motor planning,  , decreased initiation, decreased attention, decreased awareness, decreased problem solving, decreased safety awareness, decreased memory and delayed processing and decreased sitting balance, decreased standing balance, decreased postural control, decreased balance strategies and quadraparesis and therefore will continue to benefit from skilled PT intervention to increase functional independence with mobility.  Patient progressing toward long term goals..  Continue plan of care.  PT Short Term Goals Week 4:  PT Short Term Goal 1 (Week 4): Pt will maintain arousal for 3 minutes with min cues.  PT Short Term Goal 1 - Progress (Week 4): Partly met(inconsistent ) PT Short Term Goal 2 (Week 4): Pt will roll L and R in bed using UE hooking with consistent mod assist.  PT Short Term Goal 2 - Progress (Week 4): Met PT Short Term Goal 3 (Week 4): Pt will tolerate out of bed at 30 degrees of tilt in chair without orthostatic change in blood pressure from supine.   PT Short Term Goal  3 - Progress (Week 4): Met Week 5:  PT Short Term Goal 1 (Week 5): =LTGs due to ELOS  Skilled Therapeutic Interventions/Progress Updates:    no c/o pain. Session focus on arousal and increased attention during functional mobility and ADLs.    Pt noted to be incontinent of bowel/bladder on arrival. Rolling R and L with supervision<>mod assist using bed rails and mod multimodal cues for hooking UEs and using LE tone for assist with rolling.  Total assist for hygiene and clothing management.  Pt transferred to w/c with hoyer for time management.  Slide board transfer to therapy mat on R with max +1 assist.  Static sitting balance edge of mat with initial min fade to total with pt decrease in arousal.  Sit<>stand x4 with +2 max assist, pt able to maintain some degree of LE activation (? Due to tone), and max multimodal cues for upright posture.  On return to sitting pt with decreased arousal, BP WNL.  Pt positioned supine on mat with LEs over edge for extended hip flexor stretch.  Pt returned to w/c with total +2 assist and positioned tilted back in nursing station for increased interaction.    Therapy Documentation Precautions:  Precautions Precautions: Fall, Cervical Required Braces or Orthoses: Cervical Brace Cervical Brace: Hard collar, Other (comment)(Don/doff in sitting. Ok to doff in bed, when ambulating to bathroom, and when showering per MD order) Restrictions Weight Bearing Restrictions: No   See Function Navigator for Current Functional Status.  Therapy/Group: Individual Therapy  [name] 02/21/2018, 12:29 PM

## 2018-02-22 ENCOUNTER — Inpatient Hospital Stay (HOSPITAL_COMMUNITY): Payer: Medicare Other | Admitting: Occupational Therapy

## 2018-02-22 ENCOUNTER — Inpatient Hospital Stay (HOSPITAL_COMMUNITY): Payer: Medicare Other | Admitting: Physical Therapy

## 2018-02-22 ENCOUNTER — Inpatient Hospital Stay (HOSPITAL_COMMUNITY): Payer: Medicare Other | Admitting: Speech Pathology

## 2018-02-22 LAB — BASIC METABOLIC PANEL
Anion gap: 7 (ref 5–15)
BUN: 16 mg/dL (ref 8–23)
CHLORIDE: 103 mmol/L (ref 98–111)
CO2: 27 mmol/L (ref 22–32)
CREATININE: 0.67 mg/dL (ref 0.61–1.24)
Calcium: 8.7 mg/dL — ABNORMAL LOW (ref 8.9–10.3)
Glucose, Bld: 90 mg/dL (ref 70–99)
POTASSIUM: 3.8 mmol/L (ref 3.5–5.1)
SODIUM: 137 mmol/L (ref 135–145)

## 2018-02-22 LAB — URINE CULTURE: Culture: NO GROWTH

## 2018-02-22 LAB — CBC
HEMATOCRIT: 38.1 % — AB (ref 39.0–52.0)
Hemoglobin: 12.8 g/dL — ABNORMAL LOW (ref 13.0–17.0)
MCH: 32 pg (ref 26.0–34.0)
MCHC: 33.6 g/dL (ref 30.0–36.0)
MCV: 95.3 fL (ref 78.0–100.0)
PLATELETS: 234 10*3/uL (ref 150–400)
RBC: 4 MIL/uL — AB (ref 4.22–5.81)
RDW: 13.2 % (ref 11.5–15.5)
WBC: 5.7 10*3/uL (ref 4.0–10.5)

## 2018-02-22 LAB — PROTIME-INR
INR: 2.44
Prothrombin Time: 26.3 seconds — ABNORMAL HIGH (ref 11.4–15.2)

## 2018-02-22 LAB — PREALBUMIN: PREALBUMIN: 25 mg/dL (ref 18–38)

## 2018-02-22 MED ORDER — ESCITALOPRAM OXALATE 10 MG PO TABS
5.0000 mg | ORAL_TABLET | Freq: Every day | ORAL | Status: DC
  Administered 2018-02-22 – 2018-02-26 (×5): 5 mg via ORAL
  Filled 2018-02-22 (×5): qty 1

## 2018-02-22 NOTE — Progress Notes (Signed)
Physical Therapy Session Note  Patient Details  Name: Brandon [name] MRN: [id number] Date of Birth: 1940-12-13  Today's Date: 02/22/2018 PT Individual Time: 7955-8316 PT Individual Time Calculation (min): 45 min   Short Term Goals: Week 5:  PT Short Term Goal 1 (Week 5): =LTGs due to ELOS  Skilled Therapeutic Interventions/Progress Updates:    pt indicating some pain in neck but unable to rate 2/2 cognitive deficits, some grimacing with movement, monitored throughout session.  Session focus on arousal and following 1-step motor commands.  Pt not oriented throughout session, unable to state where he is and when told he was in [facility] states that it isn't true.  Decreasing arousal as session progressed with pt requiring constant, total multimodal cuing to maintain alert state by end of session.  Rolling L and R for donning pants and abdominal binder total assist.  Continue to note strong flexor/adductor tone in BLEs.  Supine>sit with total assist to bring LEs off EOB and elevate trunk.  Sitting balance edge of bed during chair set up with mod assist.  Squat/pivot to w/c on pt's R with +2 total assist.  Pt requires total assist for upper body dressing and hair brushing in w/c.  Taken to nurses station at end of session and positioned tilted back with chair alarm intact, and needs met.   Therapy Documentation Precautions:  Precautions Precautions: Fall, Cervical Required Braces or Orthoses: Cervical Brace Cervical Brace: Hard collar, Other (comment)(Don/doff in sitting. Ok to doff in bed, when ambulating to bathroom, and when showering per MD order) Restrictions Weight Bearing Restrictions: No   See Function Navigator for Current Functional Status.   Therapy/Group: Individual Therapy  [name] 02/22/2018, 2:44 PM

## 2018-02-22 NOTE — Progress Notes (Signed)
Placed patient on CPAP patient immediately removed mask.  I advised patient its important he wears it and I tried to re apply mask for patient he was not in agreement.

## 2018-02-22 NOTE — Progress Notes (Signed)
Placed pt on Cpap tol well

## 2018-02-22 NOTE — Progress Notes (Signed)
Speech Language Pathology Daily Session Note  Patient Details  Name: Brandon AngerMichael [name] MRN: [id number] Date of Birth: Mar 31, 1941  Today's Date: 02/22/2018 SLP Individual Time: 1030-1045 SLP Individual Time Calculation (min): 15 min  Short Term Goals: Week 6: SLP Short Term Goal 1 (Week 6): Pt will sustain attention to basic familiar tasks for ~ 5 minutes with Max A cues.  SLP Short Term Goal 2 (Week 6): Pt will communicate intelligible basic wants and needs with Mod A.  SLP Short Term Goal 3 (Week 6): Given 2 choices related to orientation information, pt will select correct choice in 3 of 10 opportunities with Max A cues.   Skilled Therapeutic Interventions: Skilled treatment session focused on cognition goals. SLP facilitated session by providing Total A (repositioning in bed, verbal, tactile cues) for arousal. Pt able to arouse for very brief period (seconds) with disorientation. Pt left laying in bed, bed alarm on and all needs within reach.      Function:    Cognition Comprehension Comprehension assist level: Understands basic less than 25% of the time/ requires cueing >75% of the time  Expression   Expression assist level: Expresses basis less than 25% of the time/requires cueing >75% of the time.  Social Interaction Social Interaction assist level: Interacts appropriately less than 25% of the time. May be withdrawn or combative.  Problem Solving Problem solving assist level: Solves basic less than 25% of the time - needs direction nearly all the time or does not effectively solve problems and may need a restraint for safety  Memory Memory assist level: Recognizes or recalls less than 25% of the time/requires cueing greater than 75% of the time    Pain    Therapy/Group: Individual Therapy  Brandon [name] 02/22/2018, 10:46 AM

## 2018-02-22 NOTE — Progress Notes (Signed)
ANTICOAGULATION CONSULT NOTE - Follow Up Consult  Pharmacy Consult for warfarin Indication: atrial fibrillation  Allergies  Allergen Reactions  . Flecainide Palpitations and Other (See Comments)    TACHYCARDIA SYNCOPE > LOC  . Sulfamethoxazole Other (See Comments)    Internal bleeding    Patient Measurements: Height: 6\' 1"  (185.4 cm) Weight: 185 lb 10 oz (84.2 kg) IBW/kg (Calculated) : 79.9  Vital Signs: Temp: 97.8 F (36.6 C) (09/19 0414) BP: 134/87 (09/19 0414) Pulse Rate: 72 (09/19 0414)  Labs: Recent Labs    02/20/18 0713 02/21/18 0555 02/22/18 0620  HGB 12.4* 12.7* 12.8*  HCT 37.1* 38.5* 38.1*  PLT 210 226 234  LABPROT 29.8* 28.2* 26.3*  INR 2.87 2.67 2.44  CREATININE  --   --  0.67    Estimated Creatinine Clearance: 90.2 mL/min (by C-G formula based on SCr of 0.67 mg/dL).   Medical History: Past Medical History:  Diagnosis Date  . Atrial fibrillation (HCC)     Medications:  Medications Prior to Admission  Medication Sig Dispense Refill Last Dose  . escitalopram (LEXAPRO) 10 MG tablet Take 10 mg by mouth daily.  0 01/15/2018 at Unknown time  . lisinopril (PRINIVIL,ZESTRIL) 20 MG tablet Take 20 mg by mouth daily.  2 01/15/2018 at Unknown time  . metoprolol tartrate (LOPRESSOR) 50 MG tablet Take 75 mg by mouth daily.   0 01/15/2018 at Unknown time  . tamsulosin (FLOMAX) 0.4 MG CAPS capsule Take 0.4 mg by mouth daily with supper.  2 01/15/2018 at Unknown time  . warfarin (COUMADIN) 5 MG tablet Take 2.5-5 mg by mouth See admin instructions. Take 5 mg on Tues, Thurs. Saturday Take 2.5 mg on Mon. Wed. Fri. And Sunday Take in the Morning  0 01/15/2018 at Unknown time    Assessment: 77 y/o male on Coumadin 2.5mg  daily exc for 5mg  on TTSat PTA for Afib. INR was 2.66 on admit. Kcentra given due to epidural hematoma. Coumadin restarted 8/29. Currently on daily dose of 2mg . INR remains therapeutic at 2.44. Hgb and plts stable.  Goal of Therapy:  INR 2-3 Monitor  platelets by anticoagulation protocol: Yes   Plan:  Coumadin 2 mg PO qday Monitor daily INR, CBC, s/s of bleed  Enzo BiNathan Elizandro Laura, PharmD, BCPS Clinical Pharmacist Phone number [phone] 02/22/2018 7:50 AM

## 2018-02-22 NOTE — Progress Notes (Signed)
Occupational Therapy Weekly Progress Note  Patient Details  Name: Brandon [name] MRN: [id number] Date of Birth: [birth year]  Beginning of progress report period: February 13, 2018 End of progress report period: February 22, 2018  Today's Date: 02/22/2018 OT Individual Time: 0037-0488 OT Individual Time Calculation (min): 39 min    Patient has met 0 of 2 short term goals. Pt making no progress this week towards OT goals. Pt's biggest barrier this week continues to be lethargy. Pt able to slide board transfer with +2 assistance for safety if alert and if lethargy maximove utilized for safety. Pt was able to tell therapist he had soiled self and made needs known. Pt requiring total A for hygiene and clothing management. Pt continues to have increased pain and grimacing with movement in B shoulders.   Patient continues to demonstrate the following deficits: muscle weakness and muscle paralysis, decreased cardiorespiratoy endurance, abnormal tone, ataxia, decreased coordination and decreased motor planning, decreased motor planning, decreased initiation, decreased attention, decreased awareness, decreased problem solving, decreased safety awareness, decreased memory and delayed processing and decreased sitting balance and decreased balance strategies and therefore will continue to benefit from skilled OT intervention to enhance overall performance with Reduce care partner burden.  Patient not progressing toward long term goals.  See goal revision..  Plan of care revisions: goals downgraded to overall max- total A for self care and functional transfers. Bathing goal discontinued as no longer therapeutic to address in OT sessions and pt placed on night bath.  OT Short Term Goals Week 4:  OT Short Term Goal 1 (Week 4): Pt will maintain sustained focus for 5 minutes during functional task.  OT Short Term Goal 1 - Progress (Week 4): Not met OT Short Term Goal 2 (Week 4): Pt will verbalize hygiene needs to  staff when given min verbal questioning cues. OT Short Term Goal 2 - Progress (Week 4): Not met Week 5:  OT Short Term Goal 1 (Week 5): STGs=LTG secondary to upcoming discharge  Skilled Therapeutic Interventions/Progress Updates:    Upon entering the room, pt supine in bed and sleeping soundly. Therapist was able to wake pt briefly and pt verbalizing, " I think it is just full of feces." OT rolled pt L <> R with total A as pt falling back asleep. Pt was incontinent of BM this session and required total A for hygiene needs. OT attempting to alert pt throughout session and he kept eyes closed and has unintelligible speech the rest of session. OT providing total A to don pt with B TEDs and ACE wraps for BP concerns. Pt remained at bed level with soft call bell within reach, bed lowered, and all needs within reach.   Therapy Documentation Precautions:  Precautions Precautions: Fall, Cervical Required Braces or Orthoses: Cervical Brace Cervical Brace: Hard collar, Other (comment)(Don/doff in sitting. Ok to doff in bed, when ambulating to bathroom, and when showering per MD order) Restrictions Weight Bearing Restrictions: No General:   Vital Signs: Therapy Vitals Temp: 97.8 F (36.6 C) Pulse Rate: 72 Resp: 14 BP: 134/87 Patient Position (if appropriate): Lying Oxygen Therapy SpO2: 95 % O2 Device: Room Air Pain:   ADL: ADL ADL Comments: Please see functional navigator for ADL status Vision   Perception    Praxis   Exercises:   Other Treatments:    See Function Navigator for Current Functional Status.   Therapy/Group: Individual Therapy  Gypsy Decant 02/22/2018, 7:46 AM
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
## 2018-02-23 ENCOUNTER — Inpatient Hospital Stay (HOSPITAL_COMMUNITY): Payer: Medicare Other | Admitting: Speech Pathology

## 2018-02-23 ENCOUNTER — Inpatient Hospital Stay (HOSPITAL_COMMUNITY): Payer: Medicare Other | Admitting: Occupational Therapy

## 2018-02-23 ENCOUNTER — Inpatient Hospital Stay (HOSPITAL_COMMUNITY): Payer: Medicare Other | Admitting: Physical Therapy

## 2018-02-23 LAB — CBC
HCT: 36.8 % — ABNORMAL LOW (ref 39.0–52.0)
Hemoglobin: 12.6 g/dL — ABNORMAL LOW (ref 13.0–17.0)
MCH: 32.4 pg (ref 26.0–34.0)
MCHC: 34.2 g/dL (ref 30.0–36.0)
MCV: 94.6 fL (ref 78.0–100.0)
PLATELETS: 242 10*3/uL (ref 150–400)
RBC: 3.89 MIL/uL — ABNORMAL LOW (ref 4.22–5.81)
RDW: 13.3 % (ref 11.5–15.5)
WBC: 5.7 10*3/uL (ref 4.0–10.5)

## 2018-02-23 LAB — PROTIME-INR
INR: 2.68
PROTHROMBIN TIME: 28.3 s — AB (ref 11.4–15.2)

## 2018-02-23 NOTE — Progress Notes (Signed)
Occupational Therapy Session Note  Patient Details  Name: Brandon [name] MRN: [id number] Date of Birth: 05-02-1941  Today's Date: 02/23/2018 OT Individual Time: [id number] OT Individual Time Calculation (min): 42 min   Short Term Goals: Week 5:  OT Short Term Goal 1 (Week 5): STGs=LTG secondary to upcoming discharge  Skilled Therapeutic Interventions/Progress Updates:    Pt greeted supine in bed. More alert this AM, unsure as to whether or not brief was clean. He completed rolling Rt>Lt with Mod A and instruction to hook UE through therapist's arm during brief change post large BM. Teds and ACE wraps applied with Total A. Pt disoriented to place and was unable to recall correct information within time lapse of a few minutes. At end of session pt was repositioned for comfort using props. Placed additional prop between LEs due to strong Rt adductor tone. Left him with soft call bell on chest and bed alarm set.      Therapy Documentation Precautions:  Precautions Precautions: Fall, Cervical Required Braces or Orthoses: Cervical Brace Cervical Brace: Hard collar, Other (comment)(Don/doff in sitting. Ok to doff in bed, when ambulating to bathroom, and when showering per MD order) Restrictions Weight Bearing Restrictions: No Pain: No facial grimacing during session  Pain Assessment Pain Scale: 0-10 Pain Score: 0-No pain ADL: ADL ADL Comments: Please see functional navigator for ADL status     See Function Navigator for Current Functional Status.   Therapy/Group: Individual Therapy  Leveda Kendrix A Fard Borunda 02/23/2018, 12:12 PM
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
## 2018-02-23 NOTE — Progress Notes (Signed)
ANTICOAGULATION CONSULT NOTE - Follow Up Consult  Pharmacy Consult for warfarin Indication: atrial fibrillation  Allergies  Allergen Reactions  . Flecainide Palpitations and Other (See Comments)    TACHYCARDIA SYNCOPE > LOC  . Sulfamethoxazole Other (See Comments)    Internal bleeding    Patient Measurements: Height: 6\' 1"  (185.4 cm) Weight: 185 lb 10 oz (84.2 kg) IBW/kg (Calculated) : 79.9  Vital Signs: Temp: 97.5 F (36.4 C) (09/20 0424) BP: 128/85 (09/20 0424) Pulse Rate: 81 (09/20 0424)  Labs: Recent Labs    02/21/18 0555 02/22/18 0620 02/23/18 0621  HGB 12.7* 12.8* 12.6*  HCT 38.5* 38.1* 36.8*  PLT 226 234 242  LABPROT 28.2* 26.3* 28.3*  INR 2.67 2.44 2.68  CREATININE  --  0.67  --     Estimated Creatinine Clearance: 90.2 mL/min (by C-G formula based on SCr of 0.67 mg/dL).   Medical History: Past Medical History:  Diagnosis Date  . Atrial fibrillation (HCC)     Medications:  Medications Prior to Admission  Medication Sig Dispense Refill Last Dose  . escitalopram (LEXAPRO) 10 MG tablet Take 10 mg by mouth daily.  0 01/15/2018 at Unknown time  . lisinopril (PRINIVIL,ZESTRIL) 20 MG tablet Take 20 mg by mouth daily.  2 01/15/2018 at Unknown time  . metoprolol tartrate (LOPRESSOR) 50 MG tablet Take 75 mg by mouth daily.   0 01/15/2018 at Unknown time  . tamsulosin (FLOMAX) 0.4 MG CAPS capsule Take 0.4 mg by mouth daily with supper.  2 01/15/2018 at Unknown time  . warfarin (COUMADIN) 5 MG tablet Take 2.5-5 mg by mouth See admin instructions. Take 5 mg on Tues, Thurs. Saturday Take 2.5 mg on Mon. Wed. Fri. And Sunday Take in the Morning  0 01/15/2018 at Unknown time    Assessment: 77 y/o male on Coumadin 2.5mg  daily exc for 5mg  on TTSat PTA for Afib. INR was 2.66 on admit. Kcentra given due to epidural hematoma. Coumadin restarted 8/29. Currently on daily dose of 2mg . INR remains therapeutic at 2.68. Hgb and plts stable. INR remains stable so we will change to  MWF  Goal of Therapy:  INR 2-3 Monitor platelets by anticoagulation protocol: Yes   Plan:  Coumadin 2 mg PO qday Change INR to MWF  Ulyses SouthwardMinh Rozena Fierro, PharmD, BCIDP, AAHIVP, CPP Infectious Disease Pharmacist Pager: [phone] 02/23/2018 10:02 AM

## 2018-02-23 NOTE — Progress Notes (Signed)
Speech Language Pathology Daily Session Note  Patient Details  Name: Brandon [name] MRN: [id number] Date of Birth: 01/26/1941  Today's Date: 02/23/2018 SLP Individual Time: 0730-0757 SLP Individual Time Calculation (min): 27 min  Short Term Goals: Week 6: SLP Short Term Goal 1 (Week 6): Pt will sustain attention to basic familiar tasks for ~ 5 minutes with Max A cues.  SLP Short Term Goal 2 (Week 6): Pt will communicate intelligible basic wants and needs with Mod A.  SLP Short Term Goal 3 (Week 6): Given 2 choices related to orientation information, pt will select correct choice in 3 of 10 opportunities with Max A cues.   Skilled Therapeutic Interventions:  Pt was seen for skilled ST targeting cognitive goals.  Upon arrival, pt was talking to himself about needing to have a bowel movement.  Upon therapist's arrival, pt requested to use the commode and I reminded him that, per the safety plan, PT/OT are only recommending use of bedpan at this time for safety reasons.  SLP offered bedpan but pt stated that he would prefer to eat breakfast instead.  SLP offered to assist pt in feeding himself; however, pt stated that he was having trouble maintaining an upright posture and requested that therapist feed him.  Pt directed his care appropriately in response to therapist's question cues with min assist.  He would at times become inattentive and would not respond to therapist's questions but was redirected easily and was apologetic when realizing he had not pt been paying attention to therapist.  Pt was oriented to place independently.  Pt was left in bed with bed alarm set and call bell within reach. Continue per current plan of care.    Function:  Eating Eating   Modified Consistency Diet: Yes Eating Assist Level: Helper feeds patient           Cognition Comprehension Comprehension assist level: Understands basic 50 - 74% of the time/ requires cueing 25 - 49% of the time  Expression    Expression assist level: Expresses basic 50 - 74% of the time/requires cueing 25 - 49% of the time. Needs to repeat parts of sentences.  Social Interaction Social Interaction assist level: Interacts appropriately 25 - 49% of time - Needs frequent redirection.  Problem Solving Problem solving assist level: Solves basic 25 - 49% of the time - needs direction more than half the time to initiate, plan or complete simple activities  Memory Memory assist level: Recognizes or recalls less than 25% of the time/requires cueing greater than 75% of the time    Pain Pain Assessment Pain Scale: 0-10 Pain Score: 0-No pain  Therapy/Group: Individual Therapy  Erle Guster, Melanee SpryNicole L 02/23/2018, 7:57 AM

## 2018-02-24 ENCOUNTER — Inpatient Hospital Stay (HOSPITAL_COMMUNITY): Payer: Medicare Other

## 2018-02-24 LAB — CBC
HEMATOCRIT: 37.6 % — AB (ref 39.0–52.0)
Hemoglobin: 12.4 g/dL — ABNORMAL LOW (ref 13.0–17.0)
MCH: 31.9 pg (ref 26.0–34.0)
MCHC: 33 g/dL (ref 30.0–36.0)
MCV: 96.7 fL (ref 78.0–100.0)
Platelets: 239 10*3/uL (ref 150–400)
RBC: 3.89 MIL/uL — ABNORMAL LOW (ref 4.22–5.81)
RDW: 13.7 % (ref 11.5–15.5)
WBC: 5.4 10*3/uL (ref 4.0–10.5)
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
## 2018-02-24 NOTE — Plan of Care (Signed)
  Problem: RH SAFETY Goal: RH STG ADHERE TO SAFETY PRECAUTIONS W/ASSISTANCE/DEVICE Description STG Adhere to Safety Precautions With mod Assistance/Device.  Outcome: Progressing  Call light within reach, proper footwear

## 2018-02-24 NOTE — Progress Notes (Signed)
Pain is increasing. Looks like the right leg is having spasms causing the pts leg to draw up and then he is unable to scratch it back out. Grimacing and yelling is present. Pt yells in pain stating make it stop. Leg exercises (up and down, bent and then straight were completed a few times during the shift to right leg. Pt immediately calms down and keeps eyes close but answers questions as best as he can. Less than 15 mins the pain is back and pt is back yelling "Someone help me. It hurts, it hurts."  It was also observed that pt keeps leaning his head/neck to the right. Resting his head on the top rail. Pt was repositioned several times but each time he would slide himself back to the right top rail to rest his head.

## 2018-02-24 NOTE — Progress Notes (Signed)
Occupational Therapy Session Note  Patient Details  Name: Brandon [name] MRN: [id number] Date of Birth: 03/18/1941  Today's Date: 02/24/2018 OT Individual Time: 9476-5465 OT Individual Time Calculation (min): 10 min  and Today's Date: 02/24/2018 OT Missed Time: 20 Minutes Missed Time Reason: Patient fatigue;Other (comment)(unable to arouse)   Short Term Goals: Week 3:  OT Short Term Goal 1 (Week 3): STGS=LTGs secondary to upcoming discharge  Skilled Therapeutic Interventions/Progress Updates:    1:1. Upon OT arrival pt audibly snoring in bed. Pt able to arouse only long enough to respond "good morning" then returns to audible snoring despite sternal rub/colw wash cloth and loud music. OT repositions pt as pt is semi sidelying with pillows in between knees and ankles to prevent skin breakdown/pressure points. Extied session with pt remaining in bed, call light in reach and all needs met. RN aware of decreased arousal  Therapy Documentation Precautions:  Precautions Precautions: Fall, Cervical Required Braces or Orthoses: Cervical Brace Cervical Brace: Hard collar, Other (comment)(Don/doff in sitting. Ok to doff in bed, when ambulating to bathroom, and when showering per MD order) Restrictions Weight Bearing Restrictions: No General: General OT Amount of Missed Time: 20 Minutes  See Function Navigator for Current Functional Status.   Therapy/Group: Individual Therapy  Tonny Branch 02/24/2018, 10:28 AM

## 2018-02-25 MED ORDER — BACLOFEN 10 MG PO TABS
10.0000 mg | ORAL_TABLET | Freq: Three times a day (TID) | ORAL | Status: DC
  Administered 2018-02-25 – 2018-02-27 (×7): 10 mg via ORAL
  Filled 2018-02-25 (×7): qty 1
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
## 2018-02-25 NOTE — Progress Notes (Addendum)
Baclofen was started today. Yelling has decrease tremendously. Pt has been very talkative and active in the bed. Both legs has been hanging off the left side of the bed. A medium size red spot was observed this morning on the left lower leg r/t leg keeps sliding between the rail and mattress. Boots were placed on both feet but pt wanted them taken off. Stated he feels something is pulling on his feet. Multiple bruises are present to both arms r/t pt keeps hitting top rails. IV site remains patent and dressing intact to left lower arm.

## 2018-02-26 ENCOUNTER — Inpatient Hospital Stay (HOSPITAL_COMMUNITY): Payer: Medicare Other

## 2018-02-26 ENCOUNTER — Inpatient Hospital Stay (HOSPITAL_COMMUNITY): Payer: Medicare Other | Admitting: Physical Therapy

## 2018-02-26 ENCOUNTER — Inpatient Hospital Stay (HOSPITAL_COMMUNITY): Payer: Medicare Other | Admitting: Occupational Therapy

## 2018-02-26 LAB — BASIC METABOLIC PANEL
ANION GAP: 9 (ref 5–15)
BUN: 27 mg/dL — ABNORMAL HIGH (ref 8–23)
CALCIUM: 8.6 mg/dL — AB (ref 8.9–10.3)
CHLORIDE: 105 mmol/L (ref 98–111)
CO2: 26 mmol/L (ref 22–32)
Creatinine, Ser: 0.68 mg/dL (ref 0.61–1.24)
GFR calc Af Amer: >60 mL/min (ref >60)
GFR calc non Af Amer: >60 mL/min (ref >60)
GLUCOSE: 93 mg/dL (ref 70–99)
Potassium: 3.2 mmol/L — ABNORMAL LOW (ref 3.5–5.1)
Sodium: 140 mmol/L (ref 135–145)

## 2018-02-26 LAB — PROTIME-INR
INR: 3.47
PROTHROMBIN TIME: 34.6 s — AB (ref 11.4–15.2)

## 2018-02-26 MED ORDER — POTASSIUM CHLORIDE CRYS ER 20 MEQ PO TBCR
20.0000 meq | EXTENDED_RELEASE_TABLET | Freq: Two times a day (BID) | ORAL | Status: DC
  Administered 2018-02-26 – 2018-02-27 (×2): 20 meq via ORAL
  Filled 2018-02-26 (×2): qty 1

## 2018-02-26 MED ORDER — WARFARIN SODIUM 1 MG PO TABS
1.0000 mg | ORAL_TABLET | Freq: Once | ORAL | Status: AC
  Administered 2018-02-26: 1 mg via ORAL
  Filled 2018-02-26: qty 1

## 2018-02-26 NOTE — Progress Notes (Addendum)
Physical Therapy Session Note  Patient Details  Name: Brandon [name] MRN: [id number] Date of Birth: 1941-06-01  Today's Date: 02/26/2018 PT Individual Time: 1500-1530 PT Individual Time Calculation (min): 30 min   Short Term Goals: Week 5:  PT Short Term Goal 1 (Week 5): =LTGs due to ELOS  Skilled Therapeutic Interventions/Progress Updates:  Pt received in bed & agreeable to tx. Pt reports pain in B knees & distally but did not rate and therapist provided distraction. Pt observed to be incontinent of urine & BM & pt performed rolling with total assist to allow NT to perform peri hygiene total assist. Therapist & NT donned pants and abdominal binder total assist for time management. BP supine: 121/93 mmHg (LUE), HR = 92 bpm. Pt transferred L sidelying>sitting with total assist and hospital bed features with significant assistance to upright trunk. Pt transferred bed>w/c via slide board with +2 assist for safety & multimodal cuing for anterior weight shifting & manual facilitation for sliding to lower surface across board. Pt reclined in TIS w/c & BP assessed: 102/85 mmHg (LUE), HR = 74 bpm that increased to BP = 109/69 mmHg (LUE), HR = 87 bpm. Throughout session pt able to recall therapist's name 2 times when given choice of 2. At end of session pt left sitting at nurses station with chair alarm donned & RN made aware.   Therapy Documentation Precautions:  Precautions Precautions: Fall, Cervical Required Braces or Orthoses: Cervical Brace Cervical Brace: Hard collar, Other (comment)(Don/doff in sitting. Ok to doff in bed, when ambulating to bathroom, and when showering per MD order) Restrictions Weight Bearing Restrictions: No   See Function Navigator for Current Functional Status.   Therapy/Group: Individual Therapy  Sandi MariscalVictoria M [name] 02/26/2018, 3:47 PM

## 2018-02-26 NOTE — Progress Notes (Signed)
Occupational Therapy Session Note  Patient Details  Name: Brandon [name] MRN: [id number] Date of Birth: September 25, 1940  Today's Date: 02/26/2018 OT Individual Time: [id number] OT Individual Time Calculation (min): 42 min   Short Term Goals: Week 5:  OT Short Term Goal 1 (Week 5): STGs=LTG secondary to upcoming discharge  Skilled Therapeutic Interventions/Progress Updates:    Pt greeted in bed, asleep, and unable to be roused for >30 seconds with tactile and auditory stimulation. Also tried sternal rub and cold wash cloth to face to no avail. Pt incontinent of B+B in brief. 2 helpers for safe rolling Lt>Rt to complete perihygiene. Total A for Teds/ACE wraps and donning pants bedlevel. Pt with no initiation to assist during these tasks. Positioned pt in ankle contracture boots to off load heels and for contracture mgt. B hand splints donned with forearms supported and placed in extension.  Recommended high/low bed to RN to increase his safety. Pt left with RN at session exit.   Therapy Documentation Precautions:  Precautions Precautions: Fall, Cervical Required Braces or Orthoses: Cervical Brace Cervical Brace: Hard collar, Other (comment)(Don/doff in sitting. Ok to doff in bed, when ambulating to bathroom, and when showering per MD order) Restrictions Weight Bearing Restrictions: No Vital Signs: Therapy Vitals Pulse Rate: 88 BP: (!) 117/57 Oxygen Therapy SpO2: 98 % Pain: No facial grimacing during tx    ADL: ADL ADL Comments: Please see functional navigator for ADL status     See Function Navigator for Current Functional Status.   Therapy/Group: Individual Therapy  Kanetra Ho A Shinichi Anguiano 02/26/2018, 8:41 AM
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
## 2018-02-26 NOTE — Plan of Care (Signed)
  Problem: Consults Goal: RH SPINAL CORD INJURY PATIENT EDUCATION Description  See Patient Education module for education specifics.  Outcome: Progressing Goal: Skin Care Protocol Initiated - if Braden Score 18 or less Description If consults are not indicated, leave blank or document N/A Outcome: Progressing   Problem: SCI BOWEL ELIMINATION Goal: RH STG MANAGE BOWEL WITH ASSISTANCE Description STG Manage Bowel with mod Assistance.  Outcome: Progressing Flowsheets (Taken 02/26/2018 1600) STG: Pt will manage bowels with assistance: 2-Maximum assistance Goal: RH STG SCI MANAGE BOWEL PROGRAM W/ASSIST OR AS APPROPRIATE Description STG SCI Manage bowel program w/mod assist or as appropriate.  Outcome: Progressing Flowsheets (Taken 02/26/2018 1600) STG: SCI Pt will manage bowels with assistance or as appropriate: Moderate assist   Problem: SCI BLADDER ELIMINATION Goal: RH STG MANAGE BLADDER WITH ASSISTANCE Description STG Manage Bladder With mod Assistance  Outcome: Progressing Flowsheets (Taken 02/26/2018 1600) STG: Pt will manage bladder with assistance: 1-Total assistance Goal: RH STG MANAGE BLADDER WITH EQUIPMENT WITH ASSISTANCE Description STG Manage Bladder With Equipment With mod Assistance  Outcome: Progressing Flowsheets (Taken 02/26/2018 1600) STG: Pt will manage bladder with equipment with assistance: 2-Maximum assistance Goal: RH STG SCI MANAGE BLADDER PROGRAM W/ASSISTANCE Description Mod assist  Outcome: Progressing Flowsheets (Taken 02/26/2018 1600) STG: SCI Pt will manage bladder with assistance or as appropriate: 2-Maximum assistance   Problem: RH SKIN INTEGRITY Goal: RH STG SKIN FREE OF INFECTION/BREAKDOWN Description Patients skin will remain free from further infection or breakdown with mod assist.  Outcome: Progressing Goal: RH STG MAINTAIN SKIN INTEGRITY WITH ASSISTANCE Description STG Maintain Skin Integrity With mod Assistance.  Outcome:  Progressing Flowsheets (Taken 02/26/2018 1600) STG: Maintain skin integrity with assistance: 2-Maximum assistance Goal: RH STG ABLE TO PERFORM INCISION/WOUND CARE W/ASSISTANCE Description STG Able To Perform Incision/Wound Care With total Assistance from caregiver .  Outcome: Progressing Flowsheets (Taken 02/26/2018 1600) STG: Pt will be able to perform incision/wound care with assistance: 2-Maximum assistance   Problem: RH SAFETY Goal: RH STG ADHERE TO SAFETY PRECAUTIONS W/ASSISTANCE/DEVICE Description STG Adhere to Safety Precautions With mod Assistance/Device.  Outcome: Progressing Flowsheets (Taken 02/26/2018 1600) STG:Pt will adhere to safety precautions with assistance/device: 2-Maximum assistance   Problem: RH PAIN MANAGEMENT Goal: RH STG PAIN MANAGED AT OR BELOW PT'S PAIN GOAL Description < 4  Outcome: Progressing   Problem: RH KNOWLEDGE DEFICIT SCI Goal: RH STG INCREASE KNOWLEDGE OF SELF CARE AFTER SCI Description Mod assist  Outcome: Progressing

## 2018-02-26 NOTE — Progress Notes (Signed)
Pt placed on home cpap  Tolerating at this time

## 2018-02-26 NOTE — NC FL2 (Signed)
[place] MEDICAID FL2 LEVEL OF CARE SCREENING TOOL     IDENTIFICATION  Patient Name: Brandon [name] Birthdate: 1941-03-20 Sex: male Admission Date (Current Location): 01/19/2018  [facility] [place] Number:  Producer, television/film/video and Address:  [facility], [address], [place], [place] [address]      Provider Number: [id number]  Attending Physician Name and Address:  Ranelle Oyster, MD  Relative Name and Phone Number:       Current Level of Care: Other (Comment)(Acute Inpatient Rehab ) Recommended Level of Care: Skilled Nursing Facility Prior Approval Number:    Date Approved/Denied:   PASRR Number: [id number]  Discharge Plan: SNF    Current Diagnoses: Patient Active Problem List   Diagnosis Date Noted  . Labile blood pressure   . Lethargy   . Hypokalemia   . Acute blood loss anemia   . Hypersomnia with sleep apnea   . Hypnagogic hallucinations   . Dysphagia   . Neurogenic bowel   . Neurogenic bladder   . Essential hypertension   . PAF (paroxysmal atrial fibrillation) (HCC)   . Postoperative pain   . 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 Injury of cervical spine (HCC) 01/15/2018  . 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 Obstructive sleep apnea 01/31/2014    Orientation RESPIRATION BLADDER Height & Weight     Self  Normal(CPAP at night) Incontinent Weight: 84.2 kg Height:  6\' 1"  (185.4 cm)  BEHAVIORAL SYMPTOMS/MOOD NEUROLOGICAL BOWEL NUTRITION STATUS      Incontinent Diet(Dys 2, thin liquids)  AMBULATORY STATUS COMMUNICATION OF NEEDS Skin   Total Care Verbally Other (Comment)(MASD to groin bilaterally)                       Personal Care Assistance Level of Assistance  Bathing, Feeding, Dressing, Total care Bathing Assistance: Maximum assistance Feeding assistance: Maximum assistance Dressing Assistance: Maximum  assistance Total Care Assistance: Maximum assistance   Functional Limitations Info             SPECIAL CARE FACTORS FREQUENCY  PT (By licensed PT), OT (By licensed OT), Bowel and bladder program, Speech therapy     PT Frequency: 5x/wk OT Frequency: 5x/wk Bowel and Bladder Program Frequency: qd   Speech Therapy Frequency: 5x/wk      Contractures Contractures Info: Not present    Additional Factors Info  Allergies, Psychotropic   Allergies Info: Flecainide, Sulfa Psychotropic Info: see MAR         Current Medications (02/26/2018):  This is the current hospital active medication list Current Facility-Administered Medications  Medication Dose Route Frequency Provider Last Rate Last Dose  . acetaminophen (TYLENOL) tablet 650 mg  650 mg Oral TID AC & HS Charlton Amor, PA-C   650 mg at 02/26/18 1236  . baclofen (LIORESAL) tablet 10 mg  10 mg Oral TID Erick Colace, MD   10 mg at 02/26/18 1311  . bisacodyl (DULCOLAX) suppository 10 mg  10 mg Rectal Q0600 Ranelle Oyster, MD   10 mg at 02/26/18 0526  . escitalopram (LEXAPRO) tablet 5 mg  5 mg Oral QHS Ranelle Oyster, MD   5 mg at 02/25/18 2100  . feeding supplement (ENSURE ENLIVE) (ENSURE ENLIVE) liquid 237 mL  237 mL Oral BID BM Ranelle Oyster, MD   237 mL at 02/26/18 1305  . feeding supplement (PRO-STAT SUGAR  FREE 64) liquid 30 mL  30 mL Oral BID Charlton Amorngiulli, Daniel J, PA-C   30 mL at 02/26/18 [id number]  . fludrocortisone (FLORINEF) tablet 0.1 mg  0.1 mg Oral Daily Faith RogueSwartz, Zachary T, MD   0.1 mg at 02/26/18 [id number]  . hydrocortisone cream 1 %   Topical QID Erick ColaceKirsteins, Andrew E, MD      . lisinopril (PRINIVIL,ZESTRIL) tablet 10 mg  10 mg Oral Daily Erick ColaceKirsteins, Andrew E, MD   10 mg at 02/25/18 0931  . megestrol (MEGACE) 400 MG/10ML suspension 400 mg  400 mg Oral BID Ranelle OysterSwartz, Zachary T, MD   400 mg at 02/26/18 [id number]  . metoprolol tartrate (LOPRESSOR) tablet 12.5 mg  12.5 mg Oral BID Erick ColaceKirsteins, Andrew E, MD   12.5 mg at 02/26/18  [id number]  . midodrine (PROAMATINE) tablet 5 mg  5 mg Oral BID WC Ranelle OysterSwartz, Zachary T, MD   5 mg at 02/26/18 1236  . ondansetron (ZOFRAN) tablet 4 mg  4 mg Oral Q6H PRN Angiulli, Mcarthur Rossettianiel J, PA-C       Or  . ondansetron [facility](ZOFRAN) injection 4 mg  4 mg Intravenous Q6H PRN Angiulli, Mcarthur Rossettianiel J, PA-C      . pantoprazole (PROTONIX) EC tablet 40 mg  40 mg Oral Daily Marcello FennelPatel, Ankit Anil, MD   40 mg at 02/26/18 [id number]  . potassium chloride SA (K-DUR,KLOR-CON) CR tablet 20 mEq  20 mEq Oral BID Faith RogueSwartz, Zachary T, MD      . QUEtiapine (SEROQUEL) tablet 25 mg  25 mg Oral QHS Ranelle OysterSwartz, Zachary T, MD   25 mg at 02/25/18 2059  . warfarin (COUMADIN) tablet 1 mg  1 mg Oral ONCE-1800 Ranelle OysterSwartz, Zachary T, MD      . Warfarin - Pharmacist Dosing Inpatient   Does not apply q1800 Ilda BassetDurham, Jennifer D, [facility]         Discharge Medications: Please see discharge summary for a list of discharge medications.  Relevant Imaging Results:  Relevant Lab Results:   Additional Information SS# [id number]  Amada JupiterHOYLE, Cellie Dardis, LCSW

## 2018-02-26 NOTE — Progress Notes (Signed)
Physical Therapy Session Note  Patient Details  Name: Brandon [name] MRN: [id number] Date of Birth: 12-28-1940  Today's Date: 02/26/2018 PT Individual Time: 0935-0950 PT Individual Time Calculation (min): 15 min   Short Term Goals: Week 5:  PT Short Term Goal 1 (Week 5): =LTGs due to ELOS  Skilled Therapeutic Interventions/Progress Updates: Pt presented in bed with significant R lateral lean onto bed rail heavily sleeping. Pt required extensive stimulation to arouse including sternal rub and wet washcloth. Pt aroused for brief periods then falling back asleep. Pt not orientated to place of situation as when asked where pt was stating "[place]", then stating "How much does that cost". PTA was able to reposition pt with pt grimacing and moaning several times. Once pt repositioned pt audibly sleeping. Adv nsg of pt's current disposition.      Therapy Documentation Precautions:  Precautions Precautions: Fall, Cervical Required Braces or Orthoses: Cervical Brace Cervical Brace: Hard collar, Other (comment)(Don/doff in sitting. Ok to doff in bed, when ambulating to bathroom, and when showering per MD order) Restrictions Weight Bearing Restrictions: No General: PT Amount of Missed Time (min): 15 Minutes PT Missed Treatment Reason: Patient fatigue Vital Signs:  Pain: Pain Assessment Pain Score: 0-No pain      See Function Navigator for Current Functional Status.   Therapy/Group: Individual Therapy  Brandon [name]  Brandon [name], PTA  02/26/2018, 12:14 PM

## 2018-02-26 NOTE — Progress Notes (Signed)
ANTICOAGULATION CONSULT NOTE - Follow Up Consult  Pharmacy Consult for warfarin Indication: atrial fibrillation  Allergies  Allergen Reactions  . Flecainide Palpitations and Other (See Comments)    TACHYCARDIA SYNCOPE > LOC  . Sulfamethoxazole Other (See Comments)    Internal bleeding    Patient Measurements: Height: 6\' 1"  (185.4 cm) Weight: 185 lb 10 oz (84.2 kg) IBW/kg (Calculated) : 79.9  Vital Signs: BP: 117/57 (09/23 0536) Pulse Rate: 88 (09/23 0536)  Labs: Recent Labs    02/24/18 0658 02/26/18 0709  HGB 12.4*  --   HCT 37.6*  --   PLT 239  --   LABPROT  --  34.6*  INR  --  3.47  CREATININE  --  0.68    Estimated Creatinine Clearance: 90.2 mL/min (by C-G formula based on SCr of 0.68 mg/dL).  Assessment: 77 y/o male on Coumadin 2.5mg  daily exc for 5mg  on TTSat PTA for Afib. INR was 2.66 on admit. Kcentra given due to epidural hematoma. Coumadin restarted 8/29. Currently on daily dose of 2mg   INR above goal today 3.47 Was on 2 mg daily  Goal of Therapy:  INR 2-3 Monitor platelets by anticoagulation protocol: Yes   Plan:  Warfarin 1 mg x 1 today INR in am  Brandon [name], PharmD, BCPS, BCCCP Clinical Pharmacist [phone]  Please check AMION for all [facility] Pharmacy numbers  02/26/2018 9:08 AM

## 2018-02-26 NOTE — Progress Notes (Signed)
Speech Language Pathology Daily Session Note  Patient Details  Name: Brandon [name] MRN: [id number] Date of Birth: 01-04-1941  Today's Date: 02/26/2018 SLP Individual Time: 1030-1045 SLP Individual Time Calculation (min): 15 min  Short Term Goals: Week 6: SLP Short Term Goal 1 (Week 6): Pt will sustain attention to basic familiar tasks for ~ 5 minutes with Max A cues.  SLP Short Term Goal 2 (Week 6): Pt will communicate intelligible basic wants and needs with Mod A.  SLP Short Term Goal 3 (Week 6): Given 2 choices related to orientation information, pt will select correct choice in 3 of 10 opportunities with Max A cues.   Skilled Therapeutic Interventions:Skilled ST services focused on cognitive skills. Pt was asleep upon entering room, however easily awoke with verbal cues. Pt responded to yes/no question pertaining to pain, wants/needs with mod A verbal cues. SLP facilitated orientation and sustained attention, pt identified place with binary choice cues, however unable to identify time due to reduced sustained attention. Pt required max a verbal and tactile cues for sustained attention in 30 second - 1 minute intervals, eventually unable to arouse from sleep and session was ended early due to fatigue/unability to participate.. Pt was left in room with call bell within reach and bed alarm set. Recommend to continue skilled ST services.      Function:  Eating Eating                 Cognition Comprehension Comprehension assist level: Understands basic 50 - 74% of the time/ requires cueing 25 - 49% of the time;Understands basic 25 - 49% of the time/ requires cueing 50 - 75% of the time  Expression   Expression assist level: Expresses basic 50 - 74% of the time/requires cueing 25 - 49% of the time. Needs to repeat parts of sentences.;Expresses basic 25 - 49% of the time/requires cueing 50 - 75% of the time. Uses single words/gestures.  Social Interaction Social Interaction assist level:  Interacts appropriately 25 - 49% of time - Needs frequent redirection.  Problem Solving Problem solving assist level: Solves basic 25 - 49% of the time - needs direction more than half the time to initiate, plan or complete simple activities;Solves basic less than 25% of the time - needs direction nearly all the time or does not effectively solve problems and may need a restraint for safety  Memory Memory assist level: Recognizes or recalls less than 25% of the time/requires cueing greater than 75% of the time    Pain Pain Assessment Pain Scale: 0-10 Pain Score: 0-No pain  Therapy/Group: Individual Therapy  Brandon [name]  [facility] 02/26/2018, 10:58 AM

## 2018-02-27 ENCOUNTER — Inpatient Hospital Stay (HOSPITAL_COMMUNITY): Payer: Medicare Other | Admitting: Physical Therapy

## 2018-02-27 ENCOUNTER — Inpatient Hospital Stay (HOSPITAL_COMMUNITY): Payer: Medicare Other

## 2018-02-27 ENCOUNTER — Inpatient Hospital Stay (HOSPITAL_COMMUNITY): Payer: Medicare Other | Admitting: Occupational Therapy

## 2018-02-27 LAB — PROTIME-INR
INR: 3.68
Prothrombin Time: 36.3 seconds — ABNORMAL HIGH (ref 11.4–15.2)

## 2018-02-27 MED ORDER — MIDODRINE HCL 5 MG PO TABS: 5.0000 mg | ORAL_TABLET | Freq: Two times a day (BID) | ORAL | 0 refills | Status: AC

## 2018-02-27 MED ORDER — POTASSIUM CHLORIDE CRYS ER 20 MEQ PO TBCR: 20.0000 meq | EXTENDED_RELEASE_TABLET | Freq: Two times a day (BID) | ORAL | Status: AC

## 2018-02-27 MED ORDER — BACLOFEN 10 MG PO TABS: 10.0000 mg | ORAL_TABLET | Freq: Three times a day (TID) | ORAL | 0 refills | Status: AC

## 2018-02-27 MED ORDER — RISPERIDONE 0.5 MG PO TBDP
0.2500 mg | ORAL_TABLET | Freq: Two times a day (BID) | ORAL | Status: DC
  Filled 2018-02-27: qty 0.5

## 2018-02-27 MED ORDER — RISPERIDONE 0.25 MG PO TBDP: 0.2500 mg | ORAL_TABLET | Freq: Two times a day (BID) | ORAL | 0 refills | Status: AC

## 2018-02-27 MED ORDER — WARFARIN SODIUM 2 MG PO TABS: 2.0000 mg | ORAL_TABLET | Freq: Every day | ORAL | 11 refills | Status: AC

## 2018-02-27 MED ORDER — FLUDROCORTISONE ACETATE 0.1 MG PO TABS: 0.1000 mg | ORAL_TABLET | Freq: Every day | ORAL | 0 refills | Status: AC

## 2018-02-27 MED ORDER — PANTOPRAZOLE SODIUM 40 MG PO TBEC: 40.0000 mg | DELAYED_RELEASE_TABLET | Freq: Every day | ORAL | Status: AC

## 2018-02-27 MED ORDER — ESCITALOPRAM OXALATE 5 MG PO TABS: 5.0000 mg | ORAL_TABLET | Freq: Every day | ORAL | 0 refills | Status: AC

## 2018-02-27 MED ORDER — RISPERIDONE 0.5 MG PO TBDP
0.2500 mg | ORAL_TABLET | Freq: Every day | ORAL | Status: DC
  Administered 2018-02-27: 0.25 mg via ORAL
  Filled 2018-02-27: qty 0.5

## 2018-02-27 MED ORDER — LISINOPRIL 10 MG PO TABS: 10.0000 mg | ORAL_TABLET | Freq: Every day | ORAL | Status: AC

## 2018-02-27 MED ORDER — BISACODYL 10 MG RE SUPP: 10.0000 mg | Freq: Every day | RECTAL | 0 refills | Status: AC

## 2018-02-27 MED ORDER — METOPROLOL TARTRATE 25 MG PO TABS: 12.5000 mg | ORAL_TABLET | Freq: Two times a day (BID) | ORAL | Status: AC

## 2018-02-27 MED ORDER — ACETAMINOPHEN 325 MG PO TABS: 650.0000 mg | ORAL_TABLET | Freq: Three times a day (TID) | ORAL | Status: AC

## 2018-02-27 NOTE — Progress Notes (Signed)
Occupational Therapy Discharge Summary  Patient Details  Name: Brandon [name] MRN: [id number] Date of Birth: 07-02-1940    Patient has met 7 of 9 long term goals due to improved activity tolerance and ability to compensate for deficits.  Patient to discharge at overall Total Assist level.    Reasons goals not met:  Recommendation:  Patient will benefit from ongoing skilled OT services in skilled nursing facility setting to continue to advance functional skills in the area of Reduce care partner burden.  Equipment: defer to next venue of care  Reasons for discharge: discharge from hospital  Patient/family agrees with progress made and goals achieved: Yes  OT Discharge Precautions/Restrictions  Precautions Precautions: Fall;Cervical Required Braces or Orthoses: Cervical Brace Cervical Brace: Hard collar;Other (comment) Restrictions Weight Bearing Restrictions: No Vital Signs Therapy Vitals Temp: 98.8 F (37.1 C) Temp Source: Oral Pulse Rate: 62 Resp: 18 BP: 113/87 Patient Position (if appropriate): Lying Oxygen Therapy SpO2: 97 % O2 Device: Room Air ADL ADL ADL Comments: Please see functional navigator for ADL status Vision Baseline Vision/History: Wears glasses Wears Glasses: At all times Patient Visual Report: No change from baseline Cognition Overall Cognitive Status: Impaired/Different from baseline Arousal/Alertness: Lethargic Orientation Level: Oriented to person;Disoriented to place;Disoriented to time;Disoriented to situation Attention: Sustained Sustained Attention: Impaired Sustained Attention Impairment: Verbal basic;Functional basic Memory: Impaired Awareness: Impaired Problem Solving: Impaired Problem Solving Impairment: Functional basic;Verbal basic Self Monitoring: Impaired Safety/Judgment: Impaired Comments: language of confusion and confabluations  Sensation Coordination Gross Motor Movements are Fluid and Coordinated: No Fine Motor  Movements are Fluid and Coordinated: No Motor  Motor Motor: Other (comment) Motor - Discharge Observations: guadraparesis   Trunk/Postural Assessment  Cervical Assessment Cervical Assessment: Exceptions to WFL(cervical precautions - hard collar) Thoracic Assessment Thoracic Assessment: Exceptions to WFL(kyphotic) Lumbar Assessment Lumbar Assessment: Within Functional Limits  Extremity/Trunk Assessment RUE Assessment RUE Assessment: Exceptions to [facility] LUE Assessment LUE Assessment: Exceptions to [facility]   See Function Navigator for Current Functional Status.  Gypsy Decant 02/27/2018, 4:21 PM

## 2018-02-27 NOTE — Progress Notes (Signed)
ANTICOAGULATION CONSULT NOTE - Follow Up Consult  Pharmacy Consult for warfarin Indication: atrial fibrillation  Allergies  Allergen Reactions  . Flecainide Palpitations and Other (See Comments)    TACHYCARDIA SYNCOPE > LOC  . Sulfamethoxazole Other (See Comments)    Internal bleeding    Patient Measurements: Height: 6\' 1"  (185.4 cm) Weight: 185 lb 3 oz (84 kg) IBW/kg (Calculated) : 79.9  Vital Signs: Temp: 98 F (36.7 C) (09/24 0412) Temp Source: Oral (09/24 0412) BP: 132/91 (09/24 0412) Pulse Rate: 84 (09/24 0412)  Labs: Recent Labs    02/26/18 0709 02/27/18 0954  LABPROT 34.6* 36.3*  INR 3.47 3.68  CREATININE 0.68  --     Estimated Creatinine Clearance: 90.2 mL/min (by C-G formula based on SCr of 0.68 mg/dL).  Assessment: 77 y/o male on Coumadin 2.5mg  daily exc for 5mg  on TTSat [facility] for Afib. INR was 2.66 on admit. Kcentra given due to epidural hematoma. Coumadin restarted 8/29.   INR above goal today 3.68 Was on 2 mg daily and given 1 mg yesterday  Goal of Therapy:  INR 2-3 Monitor platelets by anticoagulation protocol: Yes   Plan:  Hold warfarin today INR in am  Isaac BlissMichael Sadae Arrazola, PharmD, BCPS, BCCCP Clinical Pharmacist [phone]  Please check AMION for all [facility] numbers  02/27/2018 11:00 AM

## 2018-02-27 NOTE — Progress Notes (Signed)
Physical Therapy Session Note  Patient Details  Name: Brandon [name] MRN: [id number] Date of Birth: [birth year]  Today's Date: 02/27/2018 PT Individual Time: 0915-0940 PT Individual Time Calculation (min): 25 min   Short Term Goals: Week 5:  PT Short Term Goal 1 (Week 5): =LTGs due to ELOS  Skilled Therapeutic Interventions/Progress Updates:    Pt awake and muttering to self on PT arrival.  Not oriented to time, place, or situation this session.  Occasionally crying out in pain with movement or LE spasms but unable to communicate type or severity of pain.  Attempted to reorient patient and engaged in rolling for LB dressing to initiate session but pt requiring total assist, at some times resisting therapist's assist for rolling.  Pt with language of confusion and confabulation throughout entire session, unable to re-orient.  Positioned in L side lying for pressure relief with call bell in reach and needs met. Missed 20 minutes of skilled PT.   Therapy Documentation Precautions:  Precautions Precautions: Fall, Cervical Required Braces or Orthoses: Cervical Brace Cervical Brace: Hard collar, Other (comment)(Don/doff in sitting. Ok to doff in bed, when ambulating to bathroom, and when showering per MD order) Restrictions Weight Bearing Restrictions: No General: PT Amount of Missed Time (min): 20 Minutes PT Missed Treatment Reason: Patient fatigue;Other (Comment)   See Function Navigator for Current Functional Status.   Therapy/Group: Individual Therapy  [name] 02/27/2018, 9:45 AM

## 2018-02-27 NOTE — Progress Notes (Signed)
Social Work Patient ID: Brandon AngerMichael Meader, male   DOB: [birth year], 77 y.o.   MRN: [id number]  Have received SNF bed offer today from [facility] who can admit pt today.  Daughter has accepted bed.  Have alerted tx team and MD/PA with plan to transfer after lunch.  Dineen Conradt, LCSW

## 2018-02-27 NOTE — Discharge Summary (Signed)
Discharge summary job [id number]

## 2018-02-27 NOTE — Progress Notes (Signed)
Speech Language Pathology Discharge Summary  Patient Details  Name: Brandon [name] MRN: [id number] Date of Birth: [birth year]  Today's Date: 02/27/2018 SLP Individual Time:  -      Skilled Therapeutic Interventions:  Pt missed 60 minutes of skilled ST services due to medical request to let patient sleep. Pt was reported up for around 20 hours and had fallen asleep piror to treatment session.    Patient has met 6 of 6 long term goals.  Patient to discharge at [facility] Max;Total;Mod;Min level.  Reasons goals not met:     Clinical Impression/Discharge Summary: Pt met 6 out 6 goals, discharging at total-max A for cognition, min A swallowing on dys 2 and thin diet and mod A for speech at sentence level. Pt's ability is greatly impacted by lethargy, reduced sustained attention, confusion resulting in language of confusion and confabulations. Pt benefited from skilled ST services to maximize functional independence and reduce burden of care at SNF with follow up skilled ST services.     Care Partner:  Caregiver Able to Provide Assistance: Yes  Type of Caregiver Assistance: Physical;Cognitive  Recommendation:  Skilled Nursing facility  Rationale for SLP Follow Up: Maximize functional communication;Maximize cognitive function and independence;Maximize swallowing safety;Reduce caregiver burden   Equipment:   N/A  Reasons for discharge: Discharged from hospital   Patient/Family Agrees with Progress Made and Goals Achieved: Yes   Function:  Eating Eating   Modified Consistency Diet: Yes Eating Assist Level: Helper feeds patient     Helper [place] on Utensil: Every scoop Helper Brings Food to Mouth: Every scoop   Cognition Comprehension    Expression      Social Interaction    Problem Solving    Memory     [name]  [facility] 02/27/2018, 12:59 PM
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
## 2018-02-27 NOTE — Progress Notes (Signed)
Social Work  Discharge Note  The overall goal for the admission was met for:   Discharge location: Yes - plan expected for SNF following CIR soon after pt admitted  Length of Stay: Yes - 39 days  Discharge activity level: No - pt remained total assist at time of d/c  Home/community participation: No  Services provided included: MD, RD, PT, OT, SLP, RN, TR, Pharmacy, Neuropsych and SW  Financial Services: Medicare and Private Insurance: [place]  Follow-up services arranged: Other: SNF at [facility]  Comments (or additional information):  Patient/Family verbalized understanding of follow-up arrangements: Yes  Individual responsible for coordination of the follow-up plan: daughter  Confirmed correct DME delivered: NA    Toba Claudio
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
## 2018-02-27 NOTE — Plan of Care (Signed)
  Problem: RH Bed Mobility Goal: LTG Patient will perform bed mobility with assist (PT) Description LTG: Patient will perform bed mobility with assistance, with/without cues (PT).  02/27/2018 1439 by [name], PT Outcome: Not Met (add Reason) Note:  Pt fluctuates from min assist to total assist +2 depending on arousal/orientation  02/27/2018 1439 by [name], PT Reactivated 02/27/2018 0944 by [name], PT Outcome: Progressing
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
## 2018-02-28 ENCOUNTER — Inpatient Hospital Stay (HOSPITAL_COMMUNITY): Payer: Medicare Other | Admitting: Occupational Therapy

## 2018-02-28 ENCOUNTER — Inpatient Hospital Stay (HOSPITAL_COMMUNITY): Payer: Medicare Other

## 2018-02-28 NOTE — Progress Notes (Signed)
Physical Therapy Discharge Summary  Patient Details  Name: Brandon [name] MRN: [id number] Date of Birth: 04/30/1941   Patient has met 2 of 3 long term goals due to improved balance and improved postural control.  Patient to discharge at a wheelchair level Total Assist.   Patient's care partner unavailable to provide the necessary physical assistance at discharge.  Reasons goals not met: pt continues to require fluctuating assist for all mobility dependent upon pt's arousal, attention, and orientation.  At best, he can perform bed mobility with min assist, transfer with max assist +1 and slide board, and maintain static sitting balance with UE support and close supervision.  At worst, he is totally dependent for all mobility.    Recommendation:  Patient will benefit from ongoing skilled PT services in skilled nursing facility setting to continue to advance safe functional mobility, address ongoing impairments in cognition, balance, tone, and strength, and minimize fall risk.  Equipment: No equipment provided  Reasons for discharge: lack of progress toward goals and discharge from hospital  Patient/family agrees with progress made and goals achieved: Yes  PT Discharge Precautions/Restrictions Precautions Precautions: Fall;Cervical Required Braces or Orthoses: Cervical Brace Cervical Brace: Hard collar;Other (comment) Restrictions Weight Bearing Restrictions: No Vision/Perception  Vision - Assessment Additional Comments: unable to assess 2/2 cognitive deficits Perception Perception: Impaired Comments: impaired 2/2 lower level cognitive deficits Praxis Praxis: Impaired Praxis Impairment Details: Motor planning;Perseveration;Initiation Praxis-Other Comments: impaired 2/2 lower level cognitive deficits  Cognition Overall Cognitive Status: Impaired/Different from baseline Arousal/Alertness: Lethargic Orientation Level: Oriented to person;Disoriented to situation;Disoriented to  time;Disoriented to place Attention: Focused Focused Attention: Impaired Focused Attention Impairment: Verbal basic Sustained Attention: Impaired Selective Attention: Impaired Memory: Impaired Awareness: Impaired Awareness Impairment: Intellectual impairment Problem Solving: Impaired Safety/Judgment: Impaired Comments: language of confusion and confabluation Sensation Sensation Light Touch: Not tested(unable to formally assess 2/2 lower level cognitive deficits) Proprioception: Impaired by gross assessment Proprioception Impaired Details: Impaired RUE;Impaired LUE;Impaired RLE;Impaired LLE Coordination Gross Motor Movements are Fluid and Coordinated: No Fine Motor Movements are Fluid and Coordinated: No Coordination and Movement Description: C6 incomplete SCI Motor  Motor Motor: Tetraplegia Motor - Discharge Observations: tetraplegia  Mobility Bed Mobility Bed Mobility: Rolling Right;Rolling Left Rolling Right: Maximal Assistance - Patient 25-49%(range min to max assist dependent on arousal and orientation ) Rolling Left: Maximal Assistance - Patient 25-49% Supine to Sit: 2 Helpers Sit to Supine: 2 Helpers Transfers Transfers: Management consultant Transfers: 2 Press photographer (Assistive device): (sliding board) Locomotion  Gait Ambulation: No Gait Gait: No Stairs / Additional Locomotion Stairs: No Product manager Mobility: No  Trunk/Postural Assessment  Cervical Assessment Cervical Assessment: (hard cervical collar) Thoracic Assessment Thoracic Assessment: (kyphotic) Lumbar Assessment Lumbar Assessment: Within Functional Limits Postural Control Postural Control: Deficits on evaluation Righting Reactions: delayed and insufficient due to postural control impairments and weakness Protective Responses: absent Postural Limitations: decreased  Balance Static Sitting Balance Static Sitting - Balance Support: Bilateral upper  extremity supported;Feet supported Static Sitting - Level of Assistance: 3: Mod assist Static Sitting - Comment/# of Minutes: range min<>total dependent on fatigue and attention Dynamic Sitting Balance Dynamic Sitting - Balance Support: No upper extremity supported;Feet supported Dynamic Sitting - Level of Assistance: 2: Max assist Extremity Assessment      RLE Assessment RLE Assessment: Exceptions to [facility] Passive Range of Motion (PROM) Comments: WFL, increased DF tightness but able to reach end range w/ prolonged stretch RLE Strength RLE Overall Strength: Deficits RLE Overall Strength Comments: unable to formally assess 2/2 cognitive deficits,  flexor/adductor tone increasing in severity over length of stay RLE Tone RLE Tone: Severe LLE Assessment LLE Assessment: Exceptions to [facility] Passive Range of Motion (PROM) Comments: WFL LLE Strength LLE Overall Strength Comments: unable to formally assess 2/2 cognitive defcits, flexor and adductor tone increasing in severity over length of stay LLE Tone LLE Tone: Severe   See Function Navigator for Current Functional Status.  [name] 02/28/2018, 10:12 AM

## 2018-03-01 ENCOUNTER — Telehealth: Payer: Self-pay | Admitting: *Deleted

## 2018-03-01 NOTE — Telephone Encounter (Signed)
I spoke with Belenda Cruise at [facility] (sp?) and they have questions about Brandon [name]'s neck brace --he is there for therapy.  Brandon [name] has not been here in the office since discharge 02/27/18 form inpt and Dr Riley Kill is not in office.  I have referred her to call 4th floor inpt rehab and ask for Jesusita Oka to see if he can answer her questions.

## 2018-03-12 ENCOUNTER — Telehealth: Payer: Self-pay

## 2018-03-12 DIAGNOSIS — G4733 Obstructive sleep apnea (adult) (pediatric): Secondary | ICD-10-CM

## 2018-03-12 NOTE — Telephone Encounter (Signed)
Belenda Cruise from [facility] called requesting a respiratory therapist to come out to fit him for his mask for his [employer]. Please advise.

## 2018-03-12 NOTE — Telephone Encounter (Signed)
Yes that's fine. Why wouldn't their own medical staff be handling that question?

## 2018-03-14 ENCOUNTER — Telehealth: Payer: Self-pay

## 2018-03-14 NOTE — Telephone Encounter (Signed)
Patient called, stated that the staff is recommending a feeding tube for this patient due to excessive weight loss.  Wanting to know what is your  to know what is your opinion

## 2018-03-14 NOTE — Telephone Encounter (Signed)
I called and spoke with his daughter Lucienne Minks

## 2018-03-19 ENCOUNTER — Encounter (HOSPITAL_COMMUNITY): Payer: Self-pay | Admitting: Internal Medicine

## 2018-03-19 ENCOUNTER — Emergency Department (HOSPITAL_COMMUNITY): Payer: Medicare Other

## 2018-03-19 ENCOUNTER — Emergency Department (HOSPITAL_COMMUNITY)
Admission: EM | Admit: 2018-03-19 | Discharge: 2018-03-19 | Disposition: A | Discharge status: Home / Self Care | Payer: Medicare Other | Attending: Emergency Medicine | Admitting: Emergency Medicine

## 2018-03-19 DIAGNOSIS — Y929 Unspecified place or not applicable: Secondary | ICD-10-CM | POA: Diagnosis not present

## 2018-03-19 DIAGNOSIS — Z7901 Long term (current) use of anticoagulants: Secondary | ICD-10-CM | POA: Insufficient documentation

## 2018-03-19 DIAGNOSIS — S0083XA Contusion of other part of head, initial encounter: Secondary | ICD-10-CM | POA: Insufficient documentation

## 2018-03-19 DIAGNOSIS — Z79899 Other long term (current) drug therapy: Secondary | ICD-10-CM | POA: Insufficient documentation

## 2018-03-19 DIAGNOSIS — W07XXXA Fall from chair, initial encounter: Secondary | ICD-10-CM | POA: Insufficient documentation

## 2018-03-19 DIAGNOSIS — Y9389 Activity, other specified: Secondary | ICD-10-CM | POA: Diagnosis not present

## 2018-03-19 DIAGNOSIS — Y998 Other external cause status: Secondary | ICD-10-CM | POA: Diagnosis not present

## 2018-03-19 DIAGNOSIS — I10 Essential (primary) hypertension: Secondary | ICD-10-CM | POA: Insufficient documentation

## 2018-03-19 DIAGNOSIS — F0390 Unspecified dementia without behavioral disturbance: Secondary | ICD-10-CM | POA: Insufficient documentation

## 2018-03-19 DIAGNOSIS — W19XXXA Unspecified fall, initial encounter: Secondary | ICD-10-CM

## 2018-03-19 DIAGNOSIS — S0990XA Unspecified injury of head, initial encounter: Secondary | ICD-10-CM | POA: Diagnosis present

## 2018-03-19 LAB — BASIC METABOLIC PANEL
ANION GAP: 6 (ref 5–15)
BUN: 28 mg/dL — ABNORMAL HIGH (ref 8–23)
CHLORIDE: 108 mmol/L (ref 98–111)
CO2: 24 mmol/L (ref 22–32)
Calcium: 8.2 mg/dL — ABNORMAL LOW (ref 8.9–10.3)
Creatinine, Ser: 0.74 mg/dL (ref 0.61–1.24)
GFR calc Af Amer: >60 mL/min (ref >60)
GFR calc non Af Amer: >60 mL/min (ref >60)
Glucose, Bld: 101 mg/dL — ABNORMAL HIGH (ref 70–99)
POTASSIUM: 4.9 mmol/L (ref 3.5–5.1)
Sodium: 138 mmol/L (ref 135–145)

## 2018-03-19 LAB — CBC
HCT: 39.2 % (ref 39.0–52.0)
HEMOGLOBIN: 12.7 g/dL — AB (ref 13.0–17.0)
MCH: 32.4 pg (ref 26.0–34.0)
MCHC: 32.4 g/dL (ref 30.0–36.0)
MCV: 100 fL (ref 80.0–100.0)
NRBC: 0 % (ref 0.0–0.2)
Platelets: 266 10*3/uL (ref 150–400)
RBC: 3.92 MIL/uL — AB (ref 4.22–5.81)
RDW: 14.8 % (ref 11.5–15.5)
WBC: 9.8 10*3/uL (ref 4.0–10.5)

## 2018-03-19 LAB — PROTIME-INR
INR: 2.35
Prothrombin Time: 25.5 seconds — ABNORMAL HIGH (ref 11.4–15.2)

## 2018-03-19 MED ORDER — BACLOFEN 10 MG PO TABS
10.0000 mg | ORAL_TABLET | Freq: Once | ORAL | Status: AC
  Administered 2018-03-19: 10 mg via ORAL
  Filled 2018-03-19: qty 1

## 2018-03-19 MED ORDER — TRAMADOL HCL 50 MG PO TABS
50.0000 mg | ORAL_TABLET | Freq: Once | ORAL | Status: AC
  Administered 2018-03-19: 50 mg via ORAL
  Filled 2018-03-19: qty 1

## 2018-03-19 MED ORDER — SODIUM CHLORIDE 0.9 % IV BOLUS
1000.0000 mL | Freq: Once | INTRAVENOUS | Status: AC
  Administered 2018-03-19: 1000 mL via INTRAVENOUS

## 2018-03-19 MED ORDER — RISPERIDONE 0.25 MG PO TABS
0.2500 mg | ORAL_TABLET | Freq: Once | ORAL | Status: AC
  Administered 2018-03-19: 0.25 mg via ORAL
  Filled 2018-03-19: qty 1

## 2018-03-19 NOTE — ED Notes (Signed)
Pt returned to room from CT

## 2018-03-19 NOTE — Discharge Instructions (Addendum)
Work-up for the fall without any acute injury.  Patient stable for discharge back to facility.  Follow-up with regular doctor.  Return as needed.

## 2018-03-19 NOTE — ED Provider Notes (Signed)
Patient seen by me was turned over pending labs and CT of head neck.  No significant abnormalities INR is therapeutic.  No significant skull or brain injury and no acute injuries to the cervical spine.  Patient stable for discharge back to nursing facility.  We will give patient his evening dose of Risperdal and baclofen.   Vanetta Mulders, MD 03/19/18 2103
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
## 2018-03-19 NOTE — ED Notes (Signed)
ATTEMPTED TO GIVE REPORT TO PENNYBRYN.. NO ANSWER

## 2018-03-19 NOTE — ED Notes (Signed)
Patient transported to CT 
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
## 2018-04-04 ENCOUNTER — Encounter: Payer: Medicare Other | Attending: Physical Medicine & Rehabilitation | Admitting: Physical Medicine & Rehabilitation

## 2018-04-04 ENCOUNTER — Encounter: Payer: Self-pay | Admitting: Physical Medicine & Rehabilitation

## 2018-04-04 ENCOUNTER — Telehealth: Payer: Self-pay

## 2018-04-04 VITALS — BP 134/82 | HR 72

## 2018-04-04 DIAGNOSIS — Z981 Arthrodesis status: Secondary | ICD-10-CM | POA: Diagnosis not present

## 2018-04-04 DIAGNOSIS — N390 Urinary tract infection, site not specified: Secondary | ICD-10-CM | POA: Diagnosis not present

## 2018-04-04 DIAGNOSIS — A499 Bacterial infection, unspecified: Secondary | ICD-10-CM

## 2018-04-04 DIAGNOSIS — G4733 Obstructive sleep apnea (adult) (pediatric): Secondary | ICD-10-CM | POA: Diagnosis not present

## 2018-04-04 DIAGNOSIS — S062X9S Diffuse traumatic brain injury with loss of consciousness of unspecified duration, sequela: Secondary | ICD-10-CM | POA: Diagnosis not present

## 2018-04-04 DIAGNOSIS — N319 Neuromuscular dysfunction of bladder, unspecified: Secondary | ICD-10-CM | POA: Insufficient documentation

## 2018-04-04 DIAGNOSIS — G8254 Quadriplegia, C5-C7 incomplete: Secondary | ICD-10-CM | POA: Insufficient documentation

## 2018-04-04 DIAGNOSIS — I4891 Unspecified atrial fibrillation: Secondary | ICD-10-CM | POA: Diagnosis not present

## 2018-04-04 DIAGNOSIS — S14109S Unspecified injury at unspecified level of cervical spinal cord, sequela: Secondary | ICD-10-CM

## 2018-04-04 DIAGNOSIS — K592 Neurogenic bowel, not elsewhere classified: Secondary | ICD-10-CM | POA: Diagnosis not present

## 2018-04-04 DIAGNOSIS — S129XXS Fracture of neck, unspecified, sequela: Secondary | ICD-10-CM | POA: Diagnosis not present

## 2018-04-04 DIAGNOSIS — Z7901 Long term (current) use of anticoagulants: Secondary | ICD-10-CM | POA: Insufficient documentation

## 2018-04-04 DIAGNOSIS — R6251 Failure to thrive (child): Secondary | ICD-10-CM

## 2018-04-04 DIAGNOSIS — R5383 Other fatigue: Secondary | ICD-10-CM | POA: Diagnosis not present

## 2018-04-04 NOTE — Telephone Encounter (Signed)
Brandon [name] from [facility] called stating order written for pt to have IV fluids 75 cc per hour from 7 pm to 7 am.  He has been originals getting Clisis which is the fluid going into tissues SQ because pt veins are unstable. Can the order be changed?

## 2018-04-04 NOTE — Patient Instructions (Addendum)
1. FOR LETHARGY: DECREASE BACLOFEN TO 10MG  BID FOR ONE WEEK THEN 5MG  BID FOR ONE WEEK THEN OFF  2. FOR LETHARGY: DECREASE RISPERDAL TO 0.25MG  QHS FOR ONE WEEK THEN OFF  3. FOR DEHYDRATION: NEEDS IVF 1/2 NS 75 CC/HR 7P-7A EVERY EVENING UNTIL HE HAS OTHER OPTIONS FOR HYDRATION  4. FOR UTI: CHANGE MACROBID TO  AMOXICILLIN 250MG  TID FOR 10 DAYS  5. TO MAINTAIN BLOOD PRESSURE WHEN UP: INCREASE FLORINEF TO 0.2MG  DAILY TO BETTER SUSTAIN BP  6. FOR MUSCLE TONE/SPASTICITY: RESTORATIVE THERAPY FOR RANGE OF MOTION/PREVENTION OF CONTRACTURE, IMPROVE SITTING TOLERANCE.   7. FAMILY IS DISCUSSING A PEG.

## 2018-04-04 NOTE — Telephone Encounter (Signed)
It can be changed if that's all they offer. Are they able to use 1/2NS?

## 2018-04-04 NOTE — Progress Notes (Signed)
Subjective:    Patient ID: Brandon [name], male    DOB: Jan 12, 1941, 77 y.o.   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  I last saw him at the end of September prior to discharge from our rehab facility.  I had spoken to his daughter earlier this month regarding his nutritional issues and weight loss.  Progress is continued to be slow.  October 14 apparently he fell forward out of the chair and struck his head.  I reviewed ED notes as well as pertinent cervical and cranial imaging which did not show any acute processes. Daughter stateshe's had 3 other falls  Since going to the facility  he has continued to lose weight.  Son states that he is a lost another 5 pounds this past week alone.  Apparently some lab work was done 2 days ago which they provided me.  As result he was laced on IV fluids for a day or 2.  He is also being treated for UTI with Macrobid.  They realized over the course of a couple weeks when he became agitated and is often due to pain.  The resumed hydrocodone which seemed to decrease the agitation greatly although has not helped from a standpoint of arousal and mental status.  He remains on baclofen 10 mg 3 times daily for tone.  Is also on Risperdal 0.25 mg twice daily in addition to nighttime Remeron and Lexapro  Family is torn given his advanced directives this is far as what direction they should pursue.  He indicated that if he did have a reasonable chance of recovery that he did not want artificial means of nutrition or survival.  (I am paraphrasing without having the document in front of me.)  Pain Inventory Average Pain 5 Pain Right Now 3 My pain is n/a  In the last 24 hours, has pain interfered with the following? General activity 0 Relation with others 0 Enjoyment of life 0 What TIME of day is your pain at its worst? varies Sleep (in general) Fair  Pain is worse with: unsure Pain improves with: heat/ice and  medication Relief from Meds: n/a  Mobility use a wheelchair needs help with transfers  Function I need assistance with the following:  bathing, toileting, meal prep, household duties and shopping  Neuro/Psych bladder control problems bowel control problems loss of taste or smell  Prior Studies hospital f/u  Physicians involved in your care hospital f/u   History reviewed. No pertinent family history. Social History   Socioeconomic History  . Marital status: Single    Spouse name: Not on file  . Number of children: Not on file  . Years of education: Not on file  . Highest education level: Not on file  Occupational History  . Not on file  Social Needs  . Financial resource strain: Not on file  . Food insecurity:    Worry: Not on file    Inability: Not on file  . Transportation needs:    Medical: Not on file    Non-medical: Not on file  Tobacco Use  . Smoking status: Never Smoker  . Smokeless tobacco: Never Used  Substance and Sexual Activity  . Alcohol use: Yes    Comment: socially  . Drug use: Never  . Sexual activity: Not on file  Lifestyle  . Physical activity:    Days per week: Not on file    Minutes per  session: Not on file  . Stress: Not on file  Relationships  . Social connections:    Talks on phone: Not on file    Gets together: Not on file    Attends religious service: Not on file    Active member of club or organization: Not on file    Attends meetings of clubs or organizations: Not on file    Relationship status: Not on file  Other Topics Concern  . Not on file  Social History Narrative  . Not on file   Past Surgical History:  Procedure Laterality Date  . HERNIA REPAIR    . POSTERIOR CERVICAL FUSION/FORAMINOTOMY N/A 01/17/2018   Procedure: CERVICAL LAMINECTOMY FUSION/FORAMINOTOMY CERVICAL THREE-FOUR, CERVICAL FOUR-FIVE, CERVICAL FIVE-SIX, CERVICAL SIX-SEVEN;  Surgeon: Tressie Stalker, MD;  Location: [facility] OR;  Service: Neurosurgery;   Laterality: N/A;   Past Medical History:  Diagnosis Date  . Atrial fibrillation (HCC)    BP 134/82   Pulse 72   SpO2 96%   Opioid Risk Score:   Fall Risk Score:  `1  Depression screen PHQ 2/9  No flowsheet data found.  Review of Systems  Constitutional: Positive for appetite change.  HENT: Negative.   Eyes: Negative.   Respiratory: Positive for apnea.   Cardiovascular: Negative.   Gastrointestinal:       Bowel control problem   Endocrine: Negative.   Genitourinary: Positive for difficulty urinating and dysuria.  Musculoskeletal: Positive for gait problem.  Skin: Negative.   Allergic/Immunologic: Negative.   Hematological: Negative.   Psychiatric/Behavioral: Negative.        Objective:   Physical Exam  General: No obvious distress: Sitting in wheelchair HEENT: Head is normocephalic, atraumatic, PERRLA, EOMI, sclera anicteric, oral mucosa pink and moist, dentition intact, ext ear canals clear,  Neck: Supple without JVD or lymphadenopathy Heart: Reg rate and rhythm. No murmurs rubs or gallops Chest: CTA bilaterally without wheezes, rales, or rhonchi; no distress Abdomen: Soft, non-tender, non-distended, bowel sounds positive. Extremities: No clubbing, cyanosis, or edema. Pulses are 2+ Skin: Clean and intact without signs of breakdown Neuro: Patient is awake but appears sedated.  Is able to respond and follow simple commands with extra time.  He does respond to questions and interactive but confused manner often making references to things of the past.  Sometimes he presents with language of confusion.  He has improved grip and use of the left upper extremity and emerging grip and use of the right upper extremity as well.  Did not activate the legs today although I did range his legs and there was minimal resting tone.  Reflexes are hyperactive. Musculoskeletal: I was able to achieve full passive range of motion of all 4 limbs.   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 Status post C3-4, 4-5 and 5-6 with C6-7 laminectomy posterior lateral arthrodesis. Marland Kitchen  .             -I spoke with the patient's daughter and son at length today in the office regarding multiple issues.  Predominantly we discussed the ethics of artificial/enteral nutrition given his advanced directives.  I advised them that at this point, since he has lost dramatic weight, that beginning tube feeds would help give him a chance for recovery.  Improving his nutrition is no guarantee for a substantial recovery, however.  My thinking is that if he does not improve  over the next 3 or 4 months with appropriate nutrition, then he may be time to look at palliative/hospice measures 2. DVT Prophylaxis/Anticoagulation: c chronic Coumadin 3. Pain Management:Hydrocodone as needed.  Limits obviously as possible 4. Mood/mental status:Lexapro 5 mg nightly along with Remeron            -Would wean Risperdal to off if possible beginning at 0.25 mg nightly for 1 week then off  -Also discussed decreasing other neuro sedating medications including baclofen which he can decrease to off over 2 weeks time.  -Treat UTI.  Recommended amoxicillin as opposed to Macrobid given its better bioavailability 5.  Bladder: Continue Foley for now.  Treat UTI as above.        6.  Spasticity: As we are weaning off the baclofen, its important to maintain regular range of motion therapies. 7. Fluids/Electrolytes/Nutrition:             -Patient has lost substantial weight.  He also is dehydrated based on his recent lab work and appearance of his urine today.    -PEG as discussed above   -Recommend beginning IV fluids 75 cc an hour of half-normal saline from 7 PM to 7 AM.   8.Atrial fibrillation.  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  - daily bowel program -I/O cath prn once Foley can be removed 12. Reported hx of OSA:    Would avoid use of CPAP at this point given the agitation and causes.  Might be a good idea to check oxygen saturation at nighttime in the meantime.     More than 45 minutes was spent today in the room directly speaking with the family and examining the patient.  I will see him back in about 1 month's time.

## 2018-04-05 NOTE — Telephone Encounter (Signed)
Brandon [name] notified of the change and she states yes they can use 1/2 NS.

## 2018-05-06 DEATH — deceased

## 2018-05-16 ENCOUNTER — Encounter: Payer: BLUE CROSS/BLUE SHIELD | Admitting: Physical Medicine & Rehabilitation
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
